# Patient Record
Sex: Female | Born: 1990 | Race: White | Hispanic: No | Marital: Single | Smoking: Former smoker
Health system: Southern US, Community
[De-identification: ages and names within clinical notes are randomized; demographics above are authoritative.]

## PROBLEM LIST (undated history)

## (undated) ENCOUNTER — Inpatient Hospital Stay (HOSPITAL_COMMUNITY): Payer: Self-pay

## (undated) DIAGNOSIS — R309 Painful micturition, unspecified: Principal | ICD-10-CM

## (undated) DIAGNOSIS — B009 Herpesviral infection, unspecified: Secondary | ICD-10-CM

## (undated) DIAGNOSIS — R040 Epistaxis: Principal | ICD-10-CM

## (undated) DIAGNOSIS — N949 Unspecified condition associated with female genital organs and menstrual cycle: Secondary | ICD-10-CM

## (undated) DIAGNOSIS — R319 Hematuria, unspecified: Secondary | ICD-10-CM

## (undated) DIAGNOSIS — F419 Anxiety disorder, unspecified: Secondary | ICD-10-CM

## (undated) HISTORY — DX: Herpesviral infection, unspecified: B00.9

## (undated) HISTORY — DX: Epistaxis: R04.0

## (undated) HISTORY — PX: TONSILLECTOMY: SUR1361

## (undated) HISTORY — DX: Unspecified condition associated with female genital organs and menstrual cycle: N94.9

## (undated) HISTORY — DX: Painful micturition, unspecified: R30.9

## (undated) HISTORY — PX: OTHER SURGICAL HISTORY: SHX169

## (undated) HISTORY — DX: Anxiety disorder, unspecified: F41.9

## (undated) HISTORY — DX: Hematuria, unspecified: R31.9

---
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
## 2012-04-06 DIAGNOSIS — B009 Herpesviral infection, unspecified: Secondary | ICD-10-CM | POA: Insufficient documentation

## 2012-04-15 ENCOUNTER — Ambulatory Visit (INDEPENDENT_AMBULATORY_CARE_PROVIDER_SITE_OTHER): Payer: BC Managed Care – PPO | Admitting: Family Medicine

## 2012-04-15 VITALS — BP 106/63 | HR 76 | Temp 98.2°F | Resp 20 | Ht 63.0 in | Wt 113.0 lb

## 2012-04-15 DIAGNOSIS — Z202 Contact with and (suspected) exposure to infections with a predominantly sexual mode of transmission: Secondary | ICD-10-CM

## 2012-04-15 DIAGNOSIS — Z9189 Other specified personal risk factors, not elsewhere classified: Secondary | ICD-10-CM

## 2012-04-15 MED ORDER — AZITHROMYCIN 250 MG PO TABS: ORAL_TABLET | ORAL | Status: DC

## 2012-04-15 NOTE — Progress Notes (Signed)
  [facility]:  Office Visit  Chief Complaint:  Chief Complaint  Patient presents with  . Exposure to STD     Possible exposure to chlamydia.  Also wants pregnancy testing.    HPI: Sheila [name] is a 21 y.o. female who complains of exposure to STD, + chlamydia and was told today although her friend knew about it 2 months ago. Had a prior h/o chlamydia s/p treatment. No prior  pap smear. Denies any current vaginal dc, rashes,  dysuria, odor. Denis fevers, chills, nausea, abd/pelvic pain, dysparenia  History reviewed. No pertinent past medical history. Past Surgical History  Procedure Date  . Bilateral breast implants  12/2011    History   Social History  . Marital Status: Single    Spouse Name: N/A    Number of Children: N/A  . Years of Education: N/A   Social History Main Topics  . Smoking status: Never Smoker   . Smokeless tobacco: None  . Alcohol Use: 1.2 oz/week    2 Shots of liquor per week  . Drug Use: No  . Sexually Active: Yes    Birth Control/ Protection: Pill   Other Topics Concern  . None   Social History Narrative  . None   Family History  Problem Relation Age of Onset  . Cancer Maternal Grandmother   . Cancer Maternal Grandfather    No Known Allergies Prior to Admission medications   Not on File     ROS: The patient denies fevers, chills, night sweats, unintentional weight loss, chest pain, palpitations, wheezing, dyspnea on exertion, nausea, vomiting, abdominal pain, dysuria, hematuria, melena, numbness, weakness, or tingling.   All other systems have been reviewed and were otherwise negative with the exception of those mentioned in the HPI and as above.    PHYSICAL EXAM: Filed Vitals:   04/15/12 1729  BP: 106/63  Pulse: 76  Temp: 98.2 F (36.8 C)  Resp: 20   Filed Vitals:   04/15/12 1729  Height: 5\' 3"  (1.6 m)  Weight: 113 lb (51.256 kg)   Body mass index is 20.02 kg/(m^2).  General: Alert, no acute  distress HEENT:  Normocephalic, atraumatic, oropharynx patent.  Cardiovascular:  Regular rate and rhythm, no rubs murmurs or gallops.  No Carotid bruits, radial pulse intact. No pedal edema.  Respiratory: Clear to auscultation bilaterally.  No wheezes, rales, or rhonchi.  No cyanosis, no use of accessory musculature GI: No organomegaly, abdomen is soft and non-tender, positive bowel sounds.  No masses. Skin: No rashes. Neurologic: Facial musculature symmetric. Psychiatric: Patient is appropriate throughout our interaction. Lymphatic: No cervical lymphadenopathy Musculoskeletal: Gait intact.   LABS: Results for orders placed in visit on 04/15/12  POCT URINE PREGNANCY      Component Value Range   Preg Test, Ur Negative       EKG/XRAY:   Primary read interpreted by Dr. Conley [facility]s at [facility].   ASSESSMENT/PLAN: Encounter Diagnosis  Name Primary?  . Possible exposure to STD Yes   Labs pending Presumptively treat for Chamydia exposure. Patient was ok with plan and did not want to wait for test results Anticipatory guidance given    Rockne Coons, DO 04/15/2012 6:36 PM

## 2012-04-16 LAB — HIV ANTIBODY (ROUTINE TESTING W REFLEX): HIV: NONREACTIVE

## 2012-04-16 LAB — RPR

## 2012-04-16 LAB — HEPATITIS B SURFACE ANTIGEN: Hepatitis B Surface Ag: NEGATIVE

## 2012-04-16 LAB — HEPATITIS B SURFACE ANTIBODY, QUANTITATIVE: Hep B S AB Quant (Post): 8.7 m[IU]/mL

## 2012-04-16 LAB — HEPATITIS C ANTIBODY: HCV Ab: NEGATIVE

## 2012-04-18 LAB — GC/CHLAMYDIA PROBE AMP, URINE
Chlamydia, Swab/Urine, PCR: NEGATIVE
GC Probe Amp, Urine: NEGATIVE

## 2012-04-18 LAB — HSV(HERPES SIMPLEX VRS) I + II AB-IGG
HSV 1 Glycoprotein G Ab, IgG: <0.1 IV
HSV 2 Glycoprotein G Ab, IgG: 15.06 IV — ABNORMAL HIGH

## 2012-04-19 ENCOUNTER — Telehealth: Payer: Self-pay | Admitting: Family Medicine

## 2012-04-19 NOTE — Telephone Encounter (Signed)
Pt called back and D/W her lab results and info about HSV II. Mailed pt [facility]' info and pt will CB if she needs a Rx.

## 2012-04-19 NOTE — Telephone Encounter (Signed)
LM to call back regarding lab results. 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 Unless she is having vaginal ulcers and sxs then I would not treat unless she has a break out.  HIV, syphilis, gonorrhea, hepatitis B and C, chlamydia, were negative.

## 2013-05-15 ENCOUNTER — Ambulatory Visit (INDEPENDENT_AMBULATORY_CARE_PROVIDER_SITE_OTHER): Payer: BC Managed Care – PPO | Admitting: Obstetrics & Gynecology

## 2013-05-15 ENCOUNTER — Encounter: Payer: Self-pay | Admitting: Obstetrics & Gynecology

## 2013-05-15 VITALS — BP 120/60 | Ht 62.0 in | Wt 110.5 lb

## 2013-05-15 DIAGNOSIS — Z3201 Encounter for pregnancy test, result positive: Secondary | ICD-10-CM

## 2013-05-15 LAB — POCT URINE PREGNANCY: Preg Test, Ur: POSITIVE

## 2013-05-15 NOTE — Progress Notes (Signed)
Pt here for pregnancy test. Positive results. + cramping but no bleeding. Advised can be normal in early pregnancy. With no bleeding, that's a good sign. Advised if cramping got worse or if started bleeding, call office. Pt voiced understanding. JSY

## 2013-05-17 ENCOUNTER — Other Ambulatory Visit: Payer: Self-pay | Admitting: Obstetrics & Gynecology

## 2013-05-17 DIAGNOSIS — O3680X0 Pregnancy with inconclusive fetal viability, not applicable or unspecified: Secondary | ICD-10-CM

## 2013-05-22 ENCOUNTER — Other Ambulatory Visit: Payer: Self-pay | Admitting: Obstetrics & Gynecology

## 2013-05-22 ENCOUNTER — Ambulatory Visit (INDEPENDENT_AMBULATORY_CARE_PROVIDER_SITE_OTHER): Payer: BC Managed Care – PPO

## 2013-05-22 DIAGNOSIS — O26849 Uterine size-date discrepancy, unspecified trimester: Secondary | ICD-10-CM

## 2013-05-22 DIAGNOSIS — O3680X0 Pregnancy with inconclusive fetal viability, not applicable or unspecified: Secondary | ICD-10-CM

## 2013-05-22 DIAGNOSIS — O34599 Maternal care for other abnormalities of gravid uterus, unspecified trimester: Secondary | ICD-10-CM

## 2013-05-22 DIAGNOSIS — O34591 Maternal care for other abnormalities of gravid uterus, first trimester: Secondary | ICD-10-CM

## 2013-05-22 NOTE — Progress Notes (Signed)
U/S-transvaginal u/s performed, uterine didelphys noted, (2 uterine fundus and 2 cervix noted) IUP within Lt uterus, cx appears closed, Lt ovary with C.L. Noted, CRL c/w 5+6wks EDD 01/16/2014, FHR-98bpm noted

## 2013-06-06 ENCOUNTER — Other Ambulatory Visit (HOSPITAL_COMMUNITY)
Admission: RE | Admit: 2013-06-06 | Discharge: 2013-06-06 | Disposition: A | Discharge status: Home / Self Care | Payer: Medicaid Other | Source: Ambulatory Visit | Attending: Advanced Practice Midwife | Admitting: Advanced Practice Midwife

## 2013-06-06 ENCOUNTER — Ambulatory Visit (INDEPENDENT_AMBULATORY_CARE_PROVIDER_SITE_OTHER): Payer: Medicaid Other | Admitting: Advanced Practice Midwife

## 2013-06-06 ENCOUNTER — Encounter: Payer: Self-pay | Admitting: Advanced Practice Midwife

## 2013-06-06 VITALS — BP 116/60 | Wt 106.0 lb

## 2013-06-06 DIAGNOSIS — Z331 Pregnant state, incidental: Secondary | ICD-10-CM

## 2013-06-06 DIAGNOSIS — O3400 Maternal care for unspecified congenital malformation of uterus, unspecified trimester: Secondary | ICD-10-CM

## 2013-06-06 DIAGNOSIS — Z01419 Encounter for gynecological examination (general) (routine) without abnormal findings: Secondary | ICD-10-CM | POA: Insufficient documentation

## 2013-06-06 DIAGNOSIS — Z3400 Encounter for supervision of normal first pregnancy, unspecified trimester: Secondary | ICD-10-CM

## 2013-06-06 DIAGNOSIS — Z1389 Encounter for screening for other disorder: Secondary | ICD-10-CM

## 2013-06-06 DIAGNOSIS — Z3401 Encounter for supervision of normal first pregnancy, first trimester: Secondary | ICD-10-CM

## 2013-06-06 DIAGNOSIS — Z3490 Encounter for supervision of normal pregnancy, unspecified, unspecified trimester: Secondary | ICD-10-CM

## 2013-06-06 DIAGNOSIS — O2390 Unspecified genitourinary tract infection in pregnancy, unspecified trimester: Secondary | ICD-10-CM

## 2013-06-06 DIAGNOSIS — Q512 Other doubling of uterus, unspecified: Secondary | ICD-10-CM

## 2013-06-06 DIAGNOSIS — Z113 Encounter for screening for infections with a predominantly sexual mode of transmission: Secondary | ICD-10-CM | POA: Insufficient documentation

## 2013-06-06 DIAGNOSIS — Q5128 Other doubling of uterus, other specified: Secondary | ICD-10-CM

## 2013-06-06 DIAGNOSIS — O34591 Maternal care for other abnormalities of gravid uterus, first trimester: Secondary | ICD-10-CM

## 2013-06-06 DIAGNOSIS — O98519 Other viral diseases complicating pregnancy, unspecified trimester: Secondary | ICD-10-CM

## 2013-06-06 DIAGNOSIS — B009 Herpesviral infection, unspecified: Secondary | ICD-10-CM

## 2013-06-06 LAB — CBC
HEMATOCRIT: 37.7 % (ref 36.0–46.0)
HEMOGLOBIN: 12.4 g/dL (ref 12.0–15.0)
MCH: 26.4 pg (ref 26.0–34.0)
MCHC: 32.9 g/dL (ref 30.0–36.0)
MCV: 80.4 fL (ref 78.0–100.0)
Platelets: 257 10*3/uL (ref 150–400)
RBC: 4.69 MIL/uL (ref 3.87–5.11)
RDW: 13.1 % (ref 11.5–15.5)
WBC: 10.8 10*3/uL — ABNORMAL HIGH (ref 4.0–10.5)

## 2013-06-06 MED ORDER — OB COMPLETE PETITE 35-5-1-200 MG PO CAPS: 1.0000 | ORAL_CAPSULE | Freq: Every day | ORAL | Status: DC
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
## 2013-06-07 LAB — URINALYSIS, ROUTINE W REFLEX MICROSCOPIC
Bilirubin Urine: NEGATIVE
Glucose, UA: NEGATIVE mg/dL
HGB URINE DIPSTICK: NEGATIVE
Ketones, ur: NEGATIVE mg/dL
LEUKOCYTES UA: NEGATIVE
NITRITE: NEGATIVE
PROTEIN: NEGATIVE mg/dL
Specific Gravity, Urine: 1.021 (ref 1.005–1.030)
UROBILINOGEN UA: 1 mg/dL (ref 0.0–1.0)
pH: 6 (ref 5.0–8.0)

## 2013-06-07 LAB — VARICELLA ZOSTER ANTIBODY, IGG: VARICELLA IGG: 844.5 {index} — AB (ref <135.00)

## 2013-06-07 LAB — URINALYSIS, MICROSCOPIC ONLY
BACTERIA UA: NONE SEEN
CRYSTALS: NONE SEEN
Casts: NONE SEEN

## 2013-06-07 LAB — DRUG SCREEN, URINE, NO CONFIRMATION
Amphetamine Screen, Ur: NEGATIVE
BENZODIAZEPINES.: NEGATIVE
Barbiturate Quant, Ur: NEGATIVE
CREATININE, U: 226.8 mg/dL
Cocaine Metabolites: NEGATIVE
METHADONE: NEGATIVE
Marijuana Metabolite: NEGATIVE
Opiate Screen, Urine: NEGATIVE
PHENCYCLIDINE (PCP): NEGATIVE
PROPOXYPHENE: NEGATIVE

## 2013-06-07 LAB — CYSTIC FIBROSIS DIAGNOSTIC STUDY

## 2013-06-07 LAB — RPR

## 2013-06-07 LAB — RUBELLA SCREEN: RUBELLA: 8.25 {index} — AB (ref <0.90)

## 2013-06-07 LAB — ABO AND RH: Rh Type: POSITIVE

## 2013-06-07 LAB — HIV ANTIBODY (ROUTINE TESTING W REFLEX): HIV: NONREACTIVE

## 2013-06-07 LAB — OXYCODONE SCREEN, UA, RFLX CONFIRM: OXYCODONE SCRN UR: NEGATIVE ng/mL

## 2013-06-07 LAB — HEPATITIS B SURFACE ANTIGEN: HEP B S AG: NEGATIVE

## 2013-06-08 LAB — URINE CULTURE
Colony Count: NO GROWTH
Organism ID, Bacteria: NO GROWTH

## 2013-06-27 ENCOUNTER — Telehealth: Payer: Self-pay | Admitting: *Deleted

## 2013-06-27 NOTE — Telephone Encounter (Signed)
Spoke with pt. Pt has had cold symptoms x 3 days. No fever or wheezing. Advised to try saline nasal spray and run a cool mist humidifier when sleeping. Advised to gargle warm salt water or use cough drops for sore throat. Advised to call if she started with fever or wheezing. Pt voiced understanding. JSY

## 2013-07-04 ENCOUNTER — Ambulatory Visit (INDEPENDENT_AMBULATORY_CARE_PROVIDER_SITE_OTHER): Payer: Medicaid Other

## 2013-07-04 ENCOUNTER — Other Ambulatory Visit: Payer: Self-pay | Admitting: Adult Health

## 2013-07-04 ENCOUNTER — Encounter: Payer: Self-pay | Admitting: Adult Health

## 2013-07-04 ENCOUNTER — Encounter (INDEPENDENT_AMBULATORY_CARE_PROVIDER_SITE_OTHER): Payer: Self-pay

## 2013-07-04 ENCOUNTER — Ambulatory Visit (INDEPENDENT_AMBULATORY_CARE_PROVIDER_SITE_OTHER): Payer: Medicaid Other | Admitting: Adult Health

## 2013-07-04 VITALS — BP 106/58 | Wt 107.8 lb

## 2013-07-04 DIAGNOSIS — Z36 Encounter for antenatal screening of mother: Secondary | ICD-10-CM

## 2013-07-04 DIAGNOSIS — O98519 Other viral diseases complicating pregnancy, unspecified trimester: Secondary | ICD-10-CM

## 2013-07-04 DIAGNOSIS — Z3490 Encounter for supervision of normal pregnancy, unspecified, unspecified trimester: Secondary | ICD-10-CM

## 2013-07-04 DIAGNOSIS — Z331 Pregnant state, incidental: Secondary | ICD-10-CM

## 2013-07-04 DIAGNOSIS — Z1389 Encounter for screening for other disorder: Secondary | ICD-10-CM

## 2013-07-04 DIAGNOSIS — Z3400 Encounter for supervision of normal first pregnancy, unspecified trimester: Secondary | ICD-10-CM

## 2013-07-04 DIAGNOSIS — O3400 Maternal care for unspecified congenital malformation of uterus, unspecified trimester: Secondary | ICD-10-CM

## 2013-07-04 DIAGNOSIS — O0990 Supervision of high risk pregnancy, unspecified, unspecified trimester: Secondary | ICD-10-CM | POA: Insufficient documentation

## 2013-07-04 LAB — POCT URINALYSIS DIPSTICK
GLUCOSE UA: NEGATIVE
Ketones, UA: NEGATIVE
LEUKOCYTES UA: NEGATIVE
NITRITE UA: NEGATIVE
Protein, UA: NEGATIVE
RBC UA: NEGATIVE

## 2013-07-04 NOTE — Progress Notes (Signed)
No complaints today, for IT/NT will follow up in 4 weeks for 2nd IT.Denises any bleeding or cramping.

## 2013-07-04 NOTE — Patient Instructions (Signed)
Second Trimester of Pregnancy The second trimester is from week 13 through week 28, months 4 through 6. The second trimester is often a time when you feel your best. Your body has also adjusted to being pregnant, and you begin to feel better physically. Usually, morning sickness has lessened or quit completely, you may have more energy, and you may have an increase in appetite. The second trimester is also a time when the fetus is growing rapidly. At the end of the sixth month, the fetus is about 9 inches long and weighs about 1 pounds. You will likely begin to feel the baby move (quickening) between 18 and 20 weeks of the pregnancy. BODY CHANGES Your body goes through many changes during pregnancy. The changes vary from woman to woman.   Your weight will continue to increase. You will notice your lower abdomen bulging out.  You may begin to get stretch marks on your hips, abdomen, and breasts.  You may develop headaches that can be relieved by medicines approved by your caregiver.  You may urinate more often because the fetus is pressing on your bladder.  You may develop or continue to have heartburn as a result of your pregnancy.  You may develop constipation because certain hormones are causing the muscles that push waste through your intestines to slow down.  You may develop hemorrhoids or swollen, bulging veins (varicose veins).  You may have back pain because of the weight gain and pregnancy hormones relaxing your joints between the bones in your pelvis and as a result of a shift in weight and the muscles that support your balance.  Your breasts will continue to grow and be tender.  Your gums may bleed and may be sensitive to brushing and flossing.  Dark spots or blotches (chloasma, mask of pregnancy) may develop on your face. This will likely fade after the baby is born.  A dark line from your belly button to the pubic area (linea nigra) may appear. This will likely fade after the  baby is born. WHAT TO EXPECT AT YOUR PRENATAL VISITS During a routine prenatal visit:  You will be weighed to make sure you and the fetus are growing normally.  Your blood pressure will be taken.  Your abdomen will be measured to track your baby's growth.  The fetal heartbeat will be listened to.  Any test results from the previous visit will be discussed. Your caregiver may ask you:  How you are feeling.  If you are feeling the baby move.  If you have had any abnormal symptoms, such as leaking fluid, bleeding, severe headaches, or abdominal cramping.  If you have any questions. Other tests that may be performed during your second trimester include:  Blood tests that check for:  Low iron levels (anemia).  Gestational diabetes (between 24 and 28 weeks).  Rh antibodies.  Urine tests to check for infections, diabetes, or protein in the urine.  An ultrasound to confirm the proper growth and development of the baby.  An amniocentesis to check for possible genetic problems.  Fetal screens for spina bifida and Down syndrome. HOME CARE INSTRUCTIONS   Avoid all smoking, herbs, alcohol, and unprescribed drugs. These chemicals affect the formation and growth of the baby.  Follow your caregiver's instructions regarding medicine use. There are medicines that are either safe or unsafe to take during pregnancy.  Exercise only as directed by your caregiver. Experiencing uterine cramps is a good sign to stop exercising.  Continue to eat regular,   healthy meals.  Wear a good support bra for breast tenderness.  Do not use hot tubs, steam rooms, or saunas.  Wear your seat belt at all times when driving.  Avoid raw meat, uncooked cheese, cat litter boxes, and soil used by cats. These carry germs that can cause birth defects in the baby.  Take your prenatal vitamins.  Try taking a stool softener (if your caregiver approves) if you develop constipation. Eat more high-fiber foods,  such as fresh vegetables or fruit and whole grains. Drink plenty of fluids to keep your urine clear or pale yellow.  Take warm sitz baths to soothe any pain or discomfort caused by hemorrhoids. Use hemorrhoid cream if your caregiver approves.  If you develop varicose veins, wear support hose. Elevate your feet for 15 minutes, 3 4 times a day. Limit salt in your diet.  Avoid heavy lifting, wear low heel shoes, and practice good posture.  Rest with your legs elevated if you have leg cramps or low back pain.  Visit your dentist if you have not gone yet during your pregnancy. Use a soft toothbrush to brush your teeth and be gentle when you floss.  A sexual relationship may be continued unless your caregiver directs you otherwise.  Continue to go to all your prenatal visits as directed by your caregiver. SEEK MEDICAL CARE IF:   You have dizziness.  You have mild pelvic cramps, pelvic pressure, or nagging pain in the abdominal area.  You have persistent nausea, vomiting, or diarrhea.  You have a bad smelling vaginal discharge.  You have pain with urination. SEEK IMMEDIATE MEDICAL CARE IF:   You have a fever.  You are leaking fluid from your vagina.  You have spotting or bleeding from your vagina.  You have severe abdominal cramping or pain.  You have rapid weight gain or loss.  You have shortness of breath with chest pain.  You notice sudden or extreme swelling of your face, hands, ankles, feet, or legs.  You have not felt your baby move in over an hour.  You have severe headaches that do not go away with medicine.  You have vision changes. Document Released: 05/12/2001 Document Revised: 01/18/2013 Document Reviewed: 07/19/2012 [facility] CenterExitCare Patient Information 2014 [place], [place]. Follow up in 4 weeks for 2nd IT draw

## 2013-07-04 NOTE — Progress Notes (Signed)
U/S(12+0wks)-single IUP noted within Lt uterus, +FCA noted, FHR- 165 bpm, CRL c/w dates, Lt Cx appears closed, NB present, NT-1.7563mm, posterior Gr 0 placenta, bilateral adnexa/ovaries wnl, Rt uterus noted on today's exam again

## 2013-07-08 LAB — MATERNAL SCREEN, INTEGRATED #1

## 2013-08-01 ENCOUNTER — Other Ambulatory Visit: Payer: Self-pay | Admitting: Obstetrics & Gynecology

## 2013-08-01 ENCOUNTER — Ambulatory Visit (INDEPENDENT_AMBULATORY_CARE_PROVIDER_SITE_OTHER): Payer: Medicaid Other | Admitting: Women's Health

## 2013-08-01 ENCOUNTER — Encounter (INDEPENDENT_AMBULATORY_CARE_PROVIDER_SITE_OTHER): Payer: Self-pay

## 2013-08-01 ENCOUNTER — Encounter: Payer: Self-pay | Admitting: Women's Health

## 2013-08-01 VITALS — BP 122/62 | Wt 108.8 lb

## 2013-08-01 DIAGNOSIS — O34599 Maternal care for other abnormalities of gravid uterus, unspecified trimester: Secondary | ICD-10-CM

## 2013-08-01 DIAGNOSIS — Q5128 Other doubling of uterus, other specified: Secondary | ICD-10-CM

## 2013-08-01 DIAGNOSIS — Z1389 Encounter for screening for other disorder: Secondary | ICD-10-CM

## 2013-08-01 DIAGNOSIS — Z3490 Encounter for supervision of normal pregnancy, unspecified, unspecified trimester: Secondary | ICD-10-CM

## 2013-08-01 DIAGNOSIS — O3400 Maternal care for unspecified congenital malformation of uterus, unspecified trimester: Secondary | ICD-10-CM

## 2013-08-01 DIAGNOSIS — O98519 Other viral diseases complicating pregnancy, unspecified trimester: Secondary | ICD-10-CM

## 2013-08-01 DIAGNOSIS — Z331 Pregnant state, incidental: Secondary | ICD-10-CM

## 2013-08-01 LAB — POCT URINALYSIS DIPSTICK
Glucose, UA: NEGATIVE
Ketones, UA: NEGATIVE
Leukocytes, UA: NEGATIVE
Nitrite, UA: NEGATIVE
Protein, UA: NEGATIVE
RBC UA: NEGATIVE

## 2013-08-01 NOTE — Progress Notes (Signed)
Denies cramping, lof, vb, uti s/s.  No complaints.  Reviewed warning s/s to report.  All questions answered. F/U in 4wks for anatomy u/s and visit.  2nd IT today.   

## 2013-08-01 NOTE — Patient Instructions (Signed)
[place] Pediatricians:  [facility] [phone]            [facility] [phone]                 [facility] [phone] (usually doesn't accept new patients unless you have family there already, you are always welcome to call and ask)             [facility] ([address]) [phone]   [facility] Pediatricians:   [facility]: [phone]  [facility]: [phone]   Second Trimester of Pregnancy The second trimester is from week 13 through week 28, months 4 through 6. The second trimester is often a time when you feel your best. Your body has also adjusted to being pregnant, and you begin to feel better physically. Usually, morning sickness has lessened or quit completely, you may have more energy, and you may have an increase in appetite. The second trimester is also a time when the fetus is growing rapidly. At the end of the sixth month, the fetus is about 9 inches long and weighs about 1 pounds. You will likely begin to feel the baby move (quickening) between 18 and 20 weeks of the pregnancy. BODY CHANGES Your body goes through many changes during pregnancy. The changes vary from woman to woman.   Your weight will continue to increase. You will notice your lower abdomen bulging out.  You may begin to get stretch marks on your hips, abdomen, and breasts.  You may develop headaches that can be relieved by medicines approved by your caregiver.  You may urinate more often because the fetus is pressing on your bladder.  You may develop or continue to have heartburn as a result of your pregnancy.  You may develop constipation because certain hormones are causing the muscles that push waste through your intestines to slow down.  You may develop hemorrhoids or swollen, bulging veins (varicose veins).  You may have back pain because of the weight gain and pregnancy  hormones relaxing your joints between the bones in your pelvis and as a result of a shift in weight and the muscles that support your balance.  Your breasts will continue to grow and be tender.  Your gums may bleed and may be sensitive to brushing and flossing.  Dark spots or blotches (chloasma, mask of pregnancy) may develop on your face. This will likely fade after the baby is born.  A dark line from your belly button to the pubic area (linea nigra) may appear. This will likely fade after the baby is born. WHAT TO EXPECT AT YOUR PRENATAL VISITS During a routine prenatal visit:  You will be weighed to make sure you and the fetus are growing normally.  Your blood pressure will be taken.  Your abdomen will be measured to track your baby's growth.  The fetal heartbeat will be listened to.  Any test results from the previous visit will be discussed. Your caregiver may ask you:  How you are feeling.  If you are feeling the baby move.  If you have had any abnormal symptoms, such as leaking fluid, bleeding, severe headaches, or abdominal cramping.  If you have any questions. Other tests that may be performed during your second trimester include:  Blood tests that check for:  Low iron levels (anemia).  Gestational diabetes (between 24 and 28 weeks).  Rh antibodies.  Urine tests to check for infections, diabetes, or protein  in the urine.  An ultrasound to confirm the proper growth and development of the baby.  An amniocentesis to check for possible genetic problems.  Fetal screens for spina bifida and Down syndrome. HOME CARE INSTRUCTIONS   Avoid all smoking, herbs, alcohol, and unprescribed drugs. These chemicals affect the formation and growth of the baby.  Follow your caregiver's instructions regarding medicine use. There are medicines that are either safe or unsafe to take during pregnancy.  Exercise only as directed by your caregiver. Experiencing uterine cramps is a  good sign to stop exercising.  Continue to eat regular, healthy meals.  Wear a good support bra for breast tenderness.  Do not use hot tubs, steam rooms, or saunas.  Wear your seat belt at all times when driving.  Avoid raw meat, uncooked cheese, cat litter boxes, and soil used by cats. These carry germs that can cause birth defects in the baby.  Take your prenatal vitamins.  Try taking a stool softener (if your caregiver approves) if you develop constipation. Eat more high-fiber foods, such as fresh vegetables or fruit and whole grains. Drink plenty of fluids to keep your urine clear or pale yellow.  Take warm sitz baths to soothe any pain or discomfort caused by hemorrhoids. Use hemorrhoid cream if your caregiver approves.  If you develop varicose veins, wear support hose. Elevate your feet for 15 minutes, 3 4 times a day. Limit salt in your diet.  Avoid heavy lifting, wear low heel shoes, and practice good posture.  Rest with your legs elevated if you have leg cramps or low back pain.  Visit your dentist if you have not gone yet during your pregnancy. Use a soft toothbrush to brush your teeth and be gentle when you floss.  A sexual relationship may be continued unless your caregiver directs you otherwise.  Continue to go to all your prenatal visits as directed by your caregiver. SEEK MEDICAL CARE IF:   You have dizziness.  You have mild pelvic cramps, pelvic pressure, or nagging pain in the abdominal area.  You have persistent nausea, vomiting, or diarrhea.  You have a bad smelling vaginal discharge.  You have pain with urination. SEEK IMMEDIATE MEDICAL CARE IF:   You have a fever.  You are leaking fluid from your vagina.  You have spotting or bleeding from your vagina.  You have severe abdominal cramping or pain.  You have rapid weight gain or loss.  You have shortness of breath with chest pain.  You notice sudden or extreme swelling of your face, hands,  ankles, feet, or legs.  You have not felt your baby move in over an hour.  You have severe headaches that do not go away with medicine.  You have vision changes. Document Released: 05/12/2001 Document Revised: 01/18/2013 Document Reviewed: 07/19/2012 [facility]Exit[facility] Patient Information 2014 [place] IslandExitCare, [place]LL[place].

## 2013-08-05 LAB — MATERNAL SCREEN, INTEGRATED #2
AFP MoM: 1.22
AFP, Serum: 53 ng/mL
CALCULATED GESTATIONAL AGE MAT SCREEN: 16.4
Crown Rump Length: 61.5 mm
Estriol Mom: 1.19
Estriol, Free: 1.27 ng/mL
HCG, MOM MAT SCREEN: 1.55
INHIBIN A DIMERIC MAT SCREEN: 184 pg/mL
INHIBIN A MOM MAT SCREEN: 0.92
MSS Down Syndrome: <1:5000 {titer}
MSS Trisomy 18 Risk: <1:5000 {titer}
NT MoM: 1.16
Nuchal Translucency: 1.63 mm
Number of fetuses: 1
PAPP-A MoM: 1.09
PAPP-A: 1368 ng/mL
hCG, Serum: 61.1 IU/mL

## 2013-08-29 ENCOUNTER — Ambulatory Visit (INDEPENDENT_AMBULATORY_CARE_PROVIDER_SITE_OTHER): Payer: Medicaid Other | Admitting: Obstetrics & Gynecology

## 2013-08-29 ENCOUNTER — Encounter: Payer: Self-pay | Admitting: Women's Health

## 2013-08-29 ENCOUNTER — Encounter: Payer: Self-pay | Admitting: Obstetrics & Gynecology

## 2013-08-29 ENCOUNTER — Other Ambulatory Visit: Payer: Self-pay | Admitting: Women's Health

## 2013-08-29 ENCOUNTER — Ambulatory Visit (INDEPENDENT_AMBULATORY_CARE_PROVIDER_SITE_OTHER): Payer: Medicaid Other

## 2013-08-29 VITALS — BP 100/60 | Wt 114.0 lb

## 2013-08-29 DIAGNOSIS — Z331 Pregnant state, incidental: Secondary | ICD-10-CM

## 2013-08-29 DIAGNOSIS — Z1389 Encounter for screening for other disorder: Secondary | ICD-10-CM

## 2013-08-29 DIAGNOSIS — O3400 Maternal care for unspecified congenital malformation of uterus, unspecified trimester: Secondary | ICD-10-CM

## 2013-08-29 DIAGNOSIS — O35EXX0 Maternal care for other (suspected) fetal abnormality and damage, fetal genitourinary anomalies, not applicable or unspecified: Secondary | ICD-10-CM | POA: Insufficient documentation

## 2013-08-29 DIAGNOSIS — Q512 Other doubling of uterus, unspecified: Secondary | ICD-10-CM

## 2013-08-29 DIAGNOSIS — O358XX0 Maternal care for other (suspected) fetal abnormality and damage, not applicable or unspecified: Secondary | ICD-10-CM

## 2013-08-29 DIAGNOSIS — Z3490 Encounter for supervision of normal pregnancy, unspecified, unspecified trimester: Secondary | ICD-10-CM

## 2013-08-29 DIAGNOSIS — Q5128 Other doubling of uterus, other specified: Secondary | ICD-10-CM

## 2013-08-29 DIAGNOSIS — O34599 Maternal care for other abnormalities of gravid uterus, unspecified trimester: Secondary | ICD-10-CM

## 2013-08-29 DIAGNOSIS — O98519 Other viral diseases complicating pregnancy, unspecified trimester: Secondary | ICD-10-CM

## 2013-08-29 LAB — POCT URINALYSIS DIPSTICK
Blood, UA: NEGATIVE
Glucose, UA: NEGATIVE
Glucose, UA: NEGATIVE
KETONES UA: NEGATIVE
Leukocytes, UA: NEGATIVE
Nitrite, UA: NEGATIVE
Protein, UA: NEGATIVE
RBC UA: NEGATIVE

## 2013-08-29 NOTE — Progress Notes (Signed)
U/S(20+0wks)-single active fetus, meas c/w dates, fluid wnl, bilateral adnexa wnl, cx appears closed (3.2cm), anterior Gr 0 placenta, female fetus, bilateral hydronephrosis Rt-5.575mm Lt-3.627mm bladder volume appears WNL, no other abnl noted

## 2013-08-29 NOTE — Progress Notes (Signed)
, °

## 2013-08-29 NOTE — Progress Notes (Signed)
See sono report, repeat sonogram at 28 weeks  BP weight and urine results all reviewed and noted. Patient reports good fetal movement, denies any bleeding and no rupture of membranes symptoms or regular contractions. Patient is without complaints. All questions were answered.

## 2013-09-06 ENCOUNTER — Ambulatory Visit (INDEPENDENT_AMBULATORY_CARE_PROVIDER_SITE_OTHER): Payer: Medicaid Other | Admitting: Adult Health

## 2013-09-06 ENCOUNTER — Encounter: Payer: Self-pay | Admitting: Adult Health

## 2013-09-06 VITALS — BP 100/58 | Wt 116.0 lb

## 2013-09-06 DIAGNOSIS — N949 Unspecified condition associated with female genital organs and menstrual cycle: Secondary | ICD-10-CM

## 2013-09-06 DIAGNOSIS — O9989 Other specified diseases and conditions complicating pregnancy, childbirth and the puerperium: Secondary | ICD-10-CM

## 2013-09-06 DIAGNOSIS — R040 Epistaxis: Secondary | ICD-10-CM

## 2013-09-06 DIAGNOSIS — Z1389 Encounter for screening for other disorder: Secondary | ICD-10-CM

## 2013-09-06 DIAGNOSIS — Z331 Pregnant state, incidental: Secondary | ICD-10-CM

## 2013-09-06 HISTORY — DX: Epistaxis: R04.0

## 2013-09-06 HISTORY — DX: Unspecified condition associated with female genital organs and menstrual cycle: N94.9

## 2013-09-06 LAB — POCT URINALYSIS DIPSTICK
GLUCOSE UA: NEGATIVE
KETONES UA: NEGATIVE
Leukocytes, UA: NEGATIVE
Nitrite, UA: NEGATIVE
Protein, UA: NEGATIVE
RBC UA: NEGATIVE

## 2013-09-06 MED ORDER — OB COMPLETE PETITE 35-5-1-200 MG PO CAPS: 1.0000 | ORAL_CAPSULE | Freq: Every day | ORAL | Status: DC

## 2013-09-06 NOTE — Progress Notes (Signed)
Complains of 4 nosebleeds, told this is common try sea mist/saline nose spray, also complains of cramping yesterday none today no bleeding, ?round ligament pain cervix long and closed on US. Reassured, review handouts, call prn problems keep follow up appt.

## 2013-09-06 NOTE — Patient Instructions (Signed)
Nosebleed Nosebleeds can be caused by many conditions including trauma, infections, polyps, foreign bodies, dry mucous membranes or climate, medications and air conditioning. Most nosebleeds occur in the front of the nose. It is because of this location that most nosebleeds can be controlled by pinching the nostrils gently and continuously. Do this for at least 10 to 20 minutes. The reason for this long continuous pressure is that you must hold it long enough for the blood to clot. If during that 10 to 20 minute time period, pressure is released, the process may have to be started again. The nosebleed may stop by itself, quit with pressure, need concentrated heating (cautery) or stop with pressure from packing. HOME CARE INSTRUCTIONS   If your nose was packed, try to maintain the pack inside until your caregiver removes it. If a gauze pack was used and it starts to fall out, gently replace or cut the end off. Do not cut if a balloon catheter was used to pack the nose. Otherwise, do not remove unless instructed.  Avoid blowing your nose for 12 hours after treatment. This could dislodge the pack or clot and start bleeding again.  If the bleeding starts again, sit up and bending forward, gently pinch the front half of your nose continuously for 20 minutes.  If bleeding was caused by dry mucous membranes, cover the inside of your nose every morning with a petroleum or antibiotic ointment. Use your little fingertip as an applicator. Do this as needed during dry weather. This will keep the mucous membranes moist and allow them to heal.  Maintain humidity in your home by using less air conditioning or using a humidifier.  Do not use aspirin or medications which make bleeding more likely. Your caregiver can give you recommendations on this.  Resume normal activities as able but try to avoid straining, lifting or bending at the waist for several days.  If the nosebleeds become recurrent and the cause is  unknown, your caregiver may suggest laboratory tests. SEEK IMMEDIATE MEDICAL CARE IF:   Bleeding recurs and cannot be controlled.  There is unusual bleeding from or bruising on other parts of the body.  You have a fever.  Nosebleeds continue.  There is any worsening of the condition which originally brought you in.  You become lightheaded, feel faint, become sweaty or vomit blood. MAKE SURE YOU:   Understand these instructions.  Will watch your condition.  Will get help right away if you are not doing well or get worse. Document Released: 02/25/2005 Document Revised: 08/10/2011 Document Reviewed: 04/19/2009 [facility]Exit[facility] Patient Information 2014 [place] RockExitCare, [place]LL[place]. Try sea mist Follow up as scheduled

## 2013-09-27 ENCOUNTER — Ambulatory Visit (INDEPENDENT_AMBULATORY_CARE_PROVIDER_SITE_OTHER): Payer: Medicaid Other | Admitting: Obstetrics and Gynecology

## 2013-09-27 ENCOUNTER — Encounter: Payer: Self-pay | Admitting: Obstetrics and Gynecology

## 2013-09-27 VITALS — BP 118/76 | Wt 121.0 lb

## 2013-09-27 DIAGNOSIS — O98519 Other viral diseases complicating pregnancy, unspecified trimester: Secondary | ICD-10-CM

## 2013-09-27 DIAGNOSIS — O34599 Maternal care for other abnormalities of gravid uterus, unspecified trimester: Secondary | ICD-10-CM

## 2013-09-27 DIAGNOSIS — Z1389 Encounter for screening for other disorder: Secondary | ICD-10-CM

## 2013-09-27 DIAGNOSIS — Q5128 Other doubling of uterus, other specified: Principal | ICD-10-CM

## 2013-09-27 DIAGNOSIS — Z3490 Encounter for supervision of normal pregnancy, unspecified, unspecified trimester: Secondary | ICD-10-CM

## 2013-09-27 DIAGNOSIS — Z331 Pregnant state, incidental: Secondary | ICD-10-CM

## 2013-09-27 LAB — POCT URINALYSIS DIPSTICK
GLUCOSE UA: NEGATIVE
KETONES UA: NEGATIVE
LEUKOCYTES UA: NEGATIVE
Nitrite, UA: NEGATIVE
Protein, UA: NEGATIVE

## 2013-09-27 NOTE — Progress Notes (Signed)
Pt denies any problems or concerns at this time.  

## 2013-09-27 NOTE — Patient Instructions (Signed)
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 You will most likely deliver earlier than your August due date. Please watch for increased vaginal discharge, significant increase in uterine cramping. Please read up on preterm labor information to help identify signs.   Preterm Labor Information Preterm labor is when labor starts at less than 37 weeks of pregnancy. The normal length of a pregnancy is 39 to 41 weeks. CAUSES Often, there is no identifiable underlying cause as to why a woman goes into preterm labor. One of the most common known causes of preterm labor is infection. Infections of the uterus, cervix, vagina, amniotic sac, bladder, kidney, or even the lungs (pneumonia) can cause labor to start. Other suspected causes of preterm labor include:   Urogenital infections, such as yeast infections and bacterial vaginosis.   Uterine abnormalities (uterine shape, uterine septum, fibroids, or bleeding from the placenta).   A cervix that has been operated on (it may fail to stay closed).   Malformations in the fetus.   Multiple gestations (twins, triplets, and so on).   Breakage of the amniotic sac.  RISK FACTORS  Having a previous history of preterm labor.   Having premature rupture of membranes (PROM).   Having a placenta that covers the opening of the cervix (placenta previa).   Having a placenta that separates from the uterus (placental abruption).   Having a cervix that is too weak to hold the fetus in the uterus (incompetent cervix).   Having too much fluid in the amniotic sac (polyhydramnios).   Taking illegal drugs or smoking while pregnant.   Not gaining enough weight while pregnant.   Being younger than 4018 and older than 22 years old.   Having a low socioeconomic status.   Being African American. SYMPTOMS Signs and symptoms of preterm labor include:   Menstrual-like cramps, abdominal pain, or back pain.  Uterine  contractions that are regular, as frequent as six in an hour, regardless of their intensity (may be mild or painful).  Contractions that start on the top of the uterus and spread down to the lower abdomen and back.   A sense of increased pelvic pressure.   A watery or bloody mucus discharge that comes from the vagina.  TREATMENT Depending on the length of the pregnancy and other circumstances, your health care provider may suggest bed rest. If necessary, there are medicines that can be given to stop contractions and to mature the fetal lungs. If labor happens before 34 weeks of pregnancy, a prolonged hospital stay may be recommended. Treatment depends on the condition of both you and the fetus.  WHAT SHOULD YOU DO IF YOU THINK YOU ARE IN PRETERM LABOR? Call your health care provider right away. You will need to go to the hospital to get checked immediately. HOW CAN YOU PREVENT PRETERM LABOR IN FUTURE PREGNANCIES? You should:   Stop smoking if you smoke.  Maintain healthy weight gain and avoid chemicals and drugs that are not necessary.  Be watchful for any type of infection.  Inform your health care provider if you have a known history of preterm labor. Document Released: 08/08/2003 Document Revised: 01/18/2013 Document Reviewed: 06/20/2012 [facility] Patient Information 2014 [place], [place].

## 2013-09-27 NOTE — Progress Notes (Signed)
2433w1d. G1P0. No complaints at this time. Reports [name] is in the picture. Pt has a didelphys uterus. Pt and pt's mother's questions answered to apparent satisfactions. Preterm labor information given. Encouraged childbirth classes.

## 2013-10-09 ENCOUNTER — Telehealth: Payer: Self-pay | Admitting: Obstetrics and Gynecology

## 2013-10-09 NOTE — Telephone Encounter (Signed)
Spoke with pt. Pt states she is having a thick, pinkish/brownish discharge. Started today. + odor. + baby movement. Cramps this am, not now. Call transferred to front desk for appt for tomorrow. Advised to call the after hours nurse line if anything changes. Pt voiced understanding. JSY

## 2013-10-10 ENCOUNTER — Encounter: Payer: Self-pay | Admitting: Obstetrics & Gynecology

## 2013-10-10 ENCOUNTER — Ambulatory Visit (INDEPENDENT_AMBULATORY_CARE_PROVIDER_SITE_OTHER): Payer: Medicaid Other | Admitting: Obstetrics & Gynecology

## 2013-10-10 VITALS — BP 130/70 | Wt 123.0 lb

## 2013-10-10 DIAGNOSIS — N760 Acute vaginitis: Secondary | ICD-10-CM

## 2013-10-10 DIAGNOSIS — Z1389 Encounter for screening for other disorder: Secondary | ICD-10-CM

## 2013-10-10 DIAGNOSIS — Z331 Pregnant state, incidental: Secondary | ICD-10-CM

## 2013-10-10 LAB — POCT URINALYSIS DIPSTICK
Glucose, UA: NEGATIVE
Ketones, UA: NEGATIVE
Leukocytes, UA: NEGATIVE
Nitrite, UA: NEGATIVE
PROTEIN UA: NEGATIVE
RBC UA: NEGATIVE

## 2013-10-10 MED ORDER — OB COMPLETE PETITE 35-5-1-200 MG PO CAPS: 1.0000 | ORAL_CAPSULE | Freq: Every day | ORAL | Status: DC

## 2013-10-10 MED ORDER — METRONIDAZOLE 500 MG PO TABS: 500.0000 mg | ORAL_TABLET | Freq: Two times a day (BID) | ORAL | Status: DC

## 2013-10-10 NOTE — Progress Notes (Signed)
Complaint of vaginal discharge:  Wet prep +BV, -trichomonas neg yeast metronidaole 500 BIDx 7days  BP weight and urine results all reviewed and noted. Patient reports good fetal movement, denies any bleeding and no rupture of membranes symptoms or regular contractions. Patient is without complaints. All questions were answered.

## 2013-10-10 NOTE — Addendum Note (Signed)
Addended by: Lazaro ArmsEURE, LUTHER H on: 10/10/2013 02:45 PM   Modules accepted: Orders

## 2013-10-10 NOTE — Addendum Note (Signed)
Addended by: Criss AlvinePULLIAM, CHRYSTAL G on: 10/10/2013 02:48 PM   Modules accepted: Orders

## 2013-10-17 ENCOUNTER — Observation Stay (HOSPITAL_COMMUNITY): Payer: Medicaid Other

## 2013-10-17 ENCOUNTER — Ambulatory Visit (INDEPENDENT_AMBULATORY_CARE_PROVIDER_SITE_OTHER): Payer: Medicaid Other | Admitting: Women's Health

## 2013-10-17 ENCOUNTER — Encounter: Payer: Self-pay | Admitting: Women's Health

## 2013-10-17 ENCOUNTER — Other Ambulatory Visit: Payer: Self-pay | Admitting: Women's Health

## 2013-10-17 ENCOUNTER — Ambulatory Visit (INDEPENDENT_AMBULATORY_CARE_PROVIDER_SITE_OTHER): Payer: Medicaid Other

## 2013-10-17 ENCOUNTER — Inpatient Hospital Stay (HOSPITAL_COMMUNITY)
Admission: AD | Admit: 2013-10-17 | Discharge: 2013-10-25 | DRG: 781 | Disposition: A | Discharge status: Home / Self Care | Payer: Medicaid Other | Source: Ambulatory Visit | Attending: Family Medicine | Admitting: Family Medicine

## 2013-10-17 VITALS — BP 120/68 | Wt 123.8 lb

## 2013-10-17 DIAGNOSIS — Q512 Other doubling of uterus, unspecified: Secondary | ICD-10-CM

## 2013-10-17 DIAGNOSIS — A6000 Herpesviral infection of urogenital system, unspecified: Secondary | ICD-10-CM | POA: Diagnosis present

## 2013-10-17 DIAGNOSIS — O368390 Maternal care for abnormalities of the fetal heart rate or rhythm, unspecified trimester, not applicable or unspecified: Secondary | ICD-10-CM | POA: Diagnosis not present

## 2013-10-17 DIAGNOSIS — Z331 Pregnant state, incidental: Secondary | ICD-10-CM

## 2013-10-17 DIAGNOSIS — O98519 Other viral diseases complicating pregnancy, unspecified trimester: Secondary | ICD-10-CM | POA: Diagnosis present

## 2013-10-17 DIAGNOSIS — O358XX0 Maternal care for other (suspected) fetal abnormality and damage, not applicable or unspecified: Secondary | ICD-10-CM | POA: Diagnosis present

## 2013-10-17 DIAGNOSIS — O26872 Cervical shortening, second trimester: Secondary | ICD-10-CM

## 2013-10-17 DIAGNOSIS — O3400 Maternal care for unspecified congenital malformation of uterus, unspecified trimester: Secondary | ICD-10-CM

## 2013-10-17 DIAGNOSIS — Q5128 Other doubling of uterus, other specified: Secondary | ICD-10-CM

## 2013-10-17 DIAGNOSIS — O09899 Supervision of other high risk pregnancies, unspecified trimester: Secondary | ICD-10-CM

## 2013-10-17 DIAGNOSIS — O34599 Maternal care for other abnormalities of gravid uterus, unspecified trimester: Secondary | ICD-10-CM

## 2013-10-17 DIAGNOSIS — O0990 Supervision of high risk pregnancy, unspecified, unspecified trimester: Secondary | ICD-10-CM

## 2013-10-17 DIAGNOSIS — O26879 Cervical shortening, unspecified trimester: Secondary | ICD-10-CM

## 2013-10-17 DIAGNOSIS — O4700 False labor before 37 completed weeks of gestation, unspecified trimester: Secondary | ICD-10-CM | POA: Diagnosis present

## 2013-10-17 DIAGNOSIS — Z1389 Encounter for screening for other disorder: Secondary | ICD-10-CM

## 2013-10-17 DIAGNOSIS — O4290 Premature rupture of membranes, unspecified as to length of time between rupture and onset of labor, unspecified weeks of gestation: Principal | ICD-10-CM | POA: Diagnosis present

## 2013-10-17 LAB — CBC
HCT: 33.1 % — ABNORMAL LOW (ref 36.0–46.0)
HEMOGLOBIN: 11 g/dL — AB (ref 12.0–15.0)
MCH: 26.8 pg (ref 26.0–34.0)
MCHC: 33.2 g/dL (ref 30.0–36.0)
MCV: 80.7 fL (ref 78.0–100.0)
Platelets: 257 10*3/uL (ref 150–400)
RBC: 4.1 MIL/uL (ref 3.87–5.11)
RDW: 12.2 % (ref 11.5–15.5)
WBC: 15.1 10*3/uL — ABNORMAL HIGH (ref 4.0–10.5)

## 2013-10-17 LAB — POCT URINALYSIS DIPSTICK
Glucose, UA: NEGATIVE
KETONES UA: NEGATIVE
Leukocytes, UA: NEGATIVE
Nitrite, UA: NEGATIVE
PROTEIN UA: NEGATIVE

## 2013-10-17 LAB — OB RESULTS CONSOLE GC/CHLAMYDIA
CHLAMYDIA, DNA PROBE: NEGATIVE
GC PROBE AMP, GENITAL: NEGATIVE

## 2013-10-17 LAB — AMNISURE RUPTURE OF MEMBRANE (ROM) NOT AT ARMC: AMNISURE: POSITIVE

## 2013-10-17 MED ORDER — MAGNESIUM SULFATE 40 G IN LACTATED RINGERS - SIMPLE
2.0000 g/h | INTRAVENOUS | Status: DC
  Filled 2013-10-17: qty 500

## 2013-10-17 MED ORDER — MAGNESIUM SULFATE BOLUS VIA INFUSION
4.0000 g | Freq: Once | INTRAVENOUS | Status: AC
  Administered 2013-10-17: 4 g via INTRAVENOUS
  Filled 2013-10-17: qty 500

## 2013-10-17 MED ORDER — AMOXICILLIN 500 MG PO CAPS
500.0000 mg | ORAL_CAPSULE | Freq: Three times a day (TID) | ORAL | Status: AC
  Administered 2013-10-19 – 2013-10-24 (×15): 500 mg via ORAL
  Filled 2013-10-17 (×15): qty 1

## 2013-10-17 MED ORDER — DOCUSATE SODIUM 100 MG PO CAPS
100.0000 mg | ORAL_CAPSULE | Freq: Every day | ORAL | Status: DC
  Administered 2013-10-18 – 2013-10-25 (×8): 100 mg via ORAL
  Filled 2013-10-17 (×8): qty 1

## 2013-10-17 MED ORDER — CALCIUM CARBONATE ANTACID 500 MG PO CHEW: 2.0000 | CHEWABLE_TABLET | ORAL | Status: DC | PRN

## 2013-10-17 MED ORDER — PROGESTERONE MICRONIZED 200 MG PO CAPS
200.0000 mg | ORAL_CAPSULE | Freq: Every day | ORAL | Status: DC
  Filled 2013-10-17: qty 1

## 2013-10-17 MED ORDER — PRENATAL MULTIVITAMIN CH
1.0000 | ORAL_TABLET | Freq: Every day | ORAL | Status: DC
  Administered 2013-10-18 – 2013-10-25 (×8): 1 via ORAL
  Filled 2013-10-17 (×8): qty 1

## 2013-10-17 MED ORDER — ERYTHROMYCIN BASE 250 MG PO TABS
250.0000 mg | ORAL_TABLET | Freq: Four times a day (QID) | ORAL | Status: AC
  Administered 2013-10-19 – 2013-10-24 (×20): 250 mg via ORAL
  Filled 2013-10-17 (×21): qty 1

## 2013-10-17 MED ORDER — BETAMETHASONE SOD PHOS & ACET 6 (3-3) MG/ML IJ SUSP
12.0000 mg | INTRAMUSCULAR | Status: AC
  Administered 2013-10-17 – 2013-10-18 (×2): 12 mg via INTRAMUSCULAR
  Filled 2013-10-17 (×2): qty 2

## 2013-10-17 MED ORDER — DEXTROSE IN LACTATED RINGERS 5 % IV SOLN
INTRAVENOUS | Status: DC
  Administered 2013-10-17 – 2013-10-18 (×2): via INTRAVENOUS

## 2013-10-17 MED ORDER — SODIUM CHLORIDE 0.9 % IV SOLN
2.0000 g | Freq: Four times a day (QID) | INTRAVENOUS | Status: AC
  Administered 2013-10-17 – 2013-10-19 (×8): 2 g via INTRAVENOUS
  Filled 2013-10-17 (×8): qty 2000

## 2013-10-17 MED ORDER — LACTATED RINGERS IV SOLN: INTRAVENOUS | Status: DC

## 2013-10-17 MED ORDER — SODIUM CHLORIDE 0.9 % IV SOLN
250.0000 mg | Freq: Four times a day (QID) | INTRAVENOUS | Status: AC
  Administered 2013-10-17 – 2013-10-19 (×8): 250 mg via INTRAVENOUS
  Filled 2013-10-17 (×8): qty 5

## 2013-10-17 MED ORDER — ACETAMINOPHEN 325 MG PO TABS: 650.0000 mg | ORAL_TABLET | ORAL | Status: DC | PRN

## 2013-10-17 MED ORDER — ZOLPIDEM TARTRATE 5 MG PO TABS: 5.0000 mg | ORAL_TABLET | Freq: Every evening | ORAL | Status: DC | PRN

## 2013-10-17 NOTE — Progress Notes (Signed)
Feels tired.

## 2013-10-17 NOTE — Progress Notes (Signed)
Patient name change in epic.Unable to view strips. All previous strips will be under "Sheila [name]"

## 2013-10-17 NOTE — Progress Notes (Signed)
U/S(27+0wks)-vtx active fetus, approp growth EFW 2 lb 4 oz (50th%tile), FHR- 162 bpm, (IUP in Lt uterus) Lt cervix appears with funneling and no functional cervix noted, AFI-13.4cm WNL Single Deepest Pocket-6.7cm, female fetus, bilateral mild hydronephrosis remains Rt-6.809mm LT-3.167mm with NL appearing bladder volume noted, anterior Gr 1 placenta, (Bilateral Maternal Kidneys noted on today's exam)

## 2013-10-17 NOTE — Consult Note (Signed)
Neonatology Consult to Antenatal Patient: 10/17/2013 10:26 PM    I was requested by Dr Shawnie PonsPratt to see this patient in order to provide antenatal counseling due to  PPROM at [redacted] weeks gestation.   Sheila [name] is a  23 y/o Primigravida who was admitted this afternoon and is now [redacted] weeks GA.   She is currently "not" having active labor.  She is getting BMZ (x1 dose), Magnesium Sulfate (for CP prophylaxis) and IV antibiotics namely Ampicillin and Erythromycin.  Fetal sonogram showed bilateral mild hydronephrosis with EFW 1009 grams.  I spoke with Sheila [name] and her mother in Room 156.   We discussed in detail what to expect in case of possible delivery of the infant in the next few days including morbidity and mortality at this gestational age, usual delivery room resuscitation including intubation and surfactant administration in the DR.  Discussed possible respiratory complications and need for support including mechanical ventilation, IV access, sepsis work-up, NG/OG feedings (benefits of BF or formula --- [name] desires breast feeding, which was encouraged), risk for IVH with the potential for motor/cognitive deficits, length of stay and long-term outcome.  They had a few questions, which I answered.   I offered a NICU tour to any interested family members and would be glad to come back if she has more questions later.  Thank you for asking us to see this patient and allowing us to participate in her care.  Please call if there are any further questions.   Overton MamMary Ann T Candise Crabtree, MD (Attending Neonatologist)  Total length of face-to-face or floor/unit time for this encounter was 20 minutes.

## 2013-10-17 NOTE — MAU Note (Signed)
Pt presents to MAU tearful. Pt denies pain. Registration secretary called to antenatal because pt is a direct admit. Warm blanket and emotional support given while in lobby awaiting transfer to room.

## 2013-10-17 NOTE — H&P (Signed)
Sheila [name] is an 23 y.o. G1P0 3015w0d female.   Chief Complaint: Leaking fluid  HPI: [facility]  Reports needing towel between her legs.  Has h/o didelphys uterus.  Has had some cramping and increased vaginal discharge in 1 month prior to admission.  Negative for ROM at office, but found to be 1 cm/80 and have no measurable cervix on us.  Sent in for further management.  Found to have + Amnisure here.  Past Medical History  Diagnosis Date  . Medical history non-contributory   . HSV-2 infection   . Nosebleed 09/06/2013  . Round ligament pain 09/06/2013    Past Surgical History  Procedure Laterality Date  . Bilateral breast implants  12/2011      Family History  Problem Relation Age of Onset  . Cancer Maternal Grandmother   . Cancer Maternal Grandfather    Social History:  reports that she has never smoked. She has never used smokeless tobacco. She reports that she does not drink alcohol or use illicit drugs.  Allergies: No Known Allergies  No current facility-administered medications on file prior to encounter.   Current Outpatient Prescriptions on File Prior to Encounter  Medication Sig Dispense Refill  . metroNIDAZOLE (FLAGYL) 500 MG tablet Take 1 tablet (500 mg total) by mouth 2 (two) times daily.  14 tablet  0  . Prenat-FeCbn-FeAspGl-FA-Omega (OB COMPLETE PETITE) 35-5-1-200 MG CAPS Take 1 tablet by mouth daily.  30 capsule  11    Pertinent items are noted in HPI.  Blood pressure 117/76, pulse 102, last menstrual period 04/04/2013. BP 117/76  Pulse 102  Ht 5\' 3"  (1.6 m)  Wt 123 lb (55.792 kg)  BMI 21.79 kg/m2  LMP 04/04/2013 General appearance: alert, cooperative and appears stated age Head: Normocephalic, without obvious abnormality, atraumatic Neck: supple, symmetrical, trachea midline Lungs: normal effort Heart: regular rate and rhythm Abdomen: soft, non-tender; bowel sounds normal; no masses,  no organomegaly and  gravid Pelvic: per Joellyn HaffKim Booker CNM--1 cm/80/-2, no pool, neg fern Extremities: extremities normal, atraumatic, no cyanosis or edema Pulses: 2+ and symmetric  U/S appears vertex, no measurable cervix with large funnel, AFI 13, bilateral hydronephrosis   Prenatal Transfer Tool  Maternal Diabetes: No Genetic Screening: Normal NT and Integrated screen Maternal Ultrasounds/Referrals: Abnormal:  Findings:   Fetal Kidney Anomalies bilateral hydronephrosis with female fetus Fetal Ultrasounds or other Referrals:  None Maternal Substance Abuse:  No Significant Maternal Medications:  None Significant Maternal Lab Results: None Has uterine didelphys   Lab Results  Component Value Date   WBC 15.1* 10/17/2013   HGB 11.0* 10/17/2013   HCT 33.1* 10/17/2013   MCV 80.7 10/17/2013   PLT 257 10/17/2013   Lab Results  Component Value Date   PREGTESTUR Positive 05/15/2013     Assessment/Plan Patient Active Problem List   Diagnosis Date Noted  . Premature rupture of membranes in pregnancy, antepartum 10/17/2013    Priority: High  . Supervision of normal pregnancy 07/04/2013    Priority: High  . Fetal hydronephrosis in pregnancy, antepartum condition 08/29/2013    Priority: Medium  . 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 Nosebleed 09/06/2013  . 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

## 2013-10-17 NOTE — Progress Notes (Signed)
Work-in: watery d/c x 2 days, enough that she has to wear pad and sleep on a towel, no big gush. Had BV last week, finishing up flagyl. +cramping x 2 days. Reports good fm, no vb. SSE: cx visually closed, creamy white nonodorous d/c, no pooling w/ valsalva, nitrazine neg, fern neg. SVE: 1/80/-2. U/S CL= no measurable cervix, + funneling, AFI nl, fetal bilateral hydronephrosis still present, discussed w/ [facility]- will send to antenatal for mag, bmz, prometrium. Dr. Jordan LikesSchmitz and antenatal charge RN notified.

## 2013-10-18 ENCOUNTER — Encounter (HOSPITAL_COMMUNITY): Payer: Self-pay | Admitting: *Deleted

## 2013-10-18 ENCOUNTER — Ambulatory Visit (HOSPITAL_COMMUNITY): Payer: Medicaid Other

## 2013-10-18 LAB — URINE CULTURE
CULTURE: NO GROWTH
Colony Count: NO GROWTH
Special Requests: NORMAL

## 2013-10-18 LAB — TYPE AND SCREEN
ABO/RH(D): A POS
ANTIBODY SCREEN: NEGATIVE

## 2013-10-18 LAB — GC/CHLAMYDIA PROBE AMP
CT PROBE, AMP APTIMA: NEGATIVE
GC Probe RNA: NEGATIVE

## 2013-10-18 LAB — ABO/RH: ABO/RH(D): A POS

## 2013-10-18 NOTE — Progress Notes (Signed)
Ur chart review completed.  

## 2013-10-18 NOTE — Progress Notes (Signed)
Patient ID: Sheila [name], female   DOB: 06/07/1990, 23 y.o.   MRN: [id number] FACULTY PRACTICE ANTEPARTUM(COMPREHENSIVE) NOTE  Sheila [name] is a 23 y.o. G1P0 at 4157w1d by early ultrasound who is admitted for rupture of membranes.   Fetal presentation is cephalic. Length of Stay:  1  Days  Subjective: Doing well. No complaints. Patient reports the fetal movement as active. Patient reports uterine contraction  activity as none. Patient reports  vaginal bleeding as none. Patient describes fluid per vagina as Clear.  Vitals:  Blood pressure 108/45, pulse 102, temperature 98 F (36.7 C), temperature source Oral, resp. rate 16, height 5\' 3"  (1.6 m), weight 123 lb (55.792 kg), last menstrual period 04/04/2013. Physical Examination:  General appearance - alert, well appearing, and in no distress Abdomen - gravid, NT Fundal Height:  size equals dates Extremities: extremities normal, atraumatic, no cyanosis or edema  Membranes:ruptured, clear fluid  Fetal Monitoring:  Baseline: 130 bpm, Variability: Good {> 6 bpm), Accelerations: Reactive and Decelerations: Absent  Labs:  Results for orders placed during the hospital encounter of 10/17/13 (from the past 24 hour(s))  CBC   Collection Time    10/17/13 12:10 PM      Result Value Ref Range   WBC 15.1 (*) 4.0 - 10.5 K/uL   RBC 4.10  3.87 - 5.11 MIL/uL   Hemoglobin 11.0 (*) 12.0 - 15.0 g/dL   HCT 95.233.1 (*) 84.136.0 - 32.446.0 %   MCV 80.7  78.0 - 100.0 fL   MCH 26.8  26.0 - 34.0 pg   MCHC 33.2  30.0 - 36.0 g/dL   RDW 40.112.2  02.711.5 - 25.315.5 %   Platelets 257  150 - 400 K/uL  AMNISURE RUPTURE OF MEMBRANE (ROM)   Collection Time    10/17/13 12:45 PM      Result Value Ref Range   Amnisure ROM POSITIVE    POCT URINALYSIS DIPSTICK   Collection Time    10/17/13  8:47 AM      Result Value Ref Range   Color, UA       Clarity, UA       Glucose, UA neg     Bilirubin, UA       Ketones, UA neg     Spec Grav, UA       Blood, UA 3+     pH, UA       Protein, UA neg     Urobilinogen, UA       Nitrite, UA neg     Leukocytes, UA Negative       Medications:  Scheduled . ampicillin (OMNIPEN) IV  2 g Intravenous Q6H   Followed by  . [START ON 10/19/2013] amoxicillin  500 mg Oral Q8H  . betamethasone acetate-betamethasone sodium phosphate  12 mg Intramuscular Q24H  . docusate sodium  100 mg Oral Daily  . erythromycin  250 mg Intravenous Q6H   Followed by  . [START ON 10/19/2013] erythromycin  250 mg Oral Q6H  . prenatal multivitamin  1 tablet Oral Q1200   I have reviewed the patient's current medications.  ASSESSMENT: Patient Active Problem List   Diagnosis Date Noted  . Premature rupture of membranes in pregnancy, antepartum 10/17/2013    Priority: High  . Supervision of normal pregnancy 07/04/2013    Priority: High  . Fetal hydronephrosis in pregnancy, antepartum condition 08/29/2013    Priority: Medium  . 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 Nosebleed  09/06/2013  . 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 Management.  Delivery with s/sx's of chorio. Latent Abx. S/p Magnesium.  Second BMZ today. MFM reviewed images and felt that further imaging was unnecessary unless she is still pregnant in 4 wks.  Reva Boresanya S Chason Mciver, MD 10/18/2013,6:58 AM

## 2013-10-19 MED ORDER — NIFEDIPINE 10 MG PO CAPS
20.0000 mg | ORAL_CAPSULE | Freq: Once | ORAL | Status: AC
  Administered 2013-10-19: 20 mg via ORAL
  Filled 2013-10-19: qty 2

## 2013-10-19 NOTE — Progress Notes (Signed)
FACULTY PRACTICE ANTEPARTUM(COMPREHENSIVE) NOTE  Sheila [name] is a 23 y.o. G1P0 at 3575w2d who is admitted for PROM.   Fetal presentation is cephalic. Length of Stay:  2  Days  Subjective: Pt is having ctx on toco but not feeling. Pt has no complaints at this time. Patient reports the fetal movement as active. Patient reports uterine contraction  activity as irregular, every 2-4 minutes. Patient reports  vaginal bleeding as none. Patient describes fluid per vagina as Clear.  Vitals:  Blood pressure 109/68, pulse 89, temperature 97.7 F (36.5 C), temperature source Oral, resp. rate 18, height 5\' 3"  (1.6 m), weight 55.792 kg (123 lb), last menstrual period 04/04/2013, SpO2 96.00%. Physical Examination:  General appearance - alert, well appearing, and in no distress and oriented to person, place, and time Heart - normal rate and regular rhythm Abdomen - soft, nontender, nondistended Fundal Height:  size equals dates Cervical Exam: Not evaluated. 1/80 5/19. Extremities: extremities normal, atraumatic, no cyanosis or edema and Homans sign is negative, no sign of DVT with DTRs 2+ bilaterally Membranes:ruptured, clear fluid  Fetal Monitoring:  Baseline: 140s bpm, Variability: Good {> 6 bpm), Accelerations: Reactive and Decelerations: Absent  Labs:  No results found for this or any previous visit (from the past 24 hour(s)).  Imaging Studies:     Medications:  Scheduled . ampicillin (OMNIPEN) IV  2 g Intravenous Q6H   Followed by  . amoxicillin  500 mg Oral Q8H  . docusate sodium  100 mg Oral Daily  . erythromycin  250 mg Intravenous Q6H   Followed by  . erythromycin  250 mg Oral Q6H  . NIFEdipine  20 mg Oral Once  . prenatal multivitamin  1 tablet Oral Q1200   I have reviewed the patient's current medications.  ASSESSMENT: Sheila [name] is a 23 y.o. G1P0 at 4475w2d by early ultrasound who is admitted for PROM.    Patient Active Problem List   Diagnosis Date Noted  .  Preterm labor 10/18/2013  . Premature rupture of membranes in pregnancy, antepartum 10/17/2013  . Nosebleed 09/06/2013  . Fetal hydronephrosis in pregnancy, antepartum condition 08/29/2013  . Supervision of normal pregnancy 07/04/2013  . 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

## 2013-10-19 NOTE — Progress Notes (Signed)
Procardia 20mg  given PO

## 2013-10-19 NOTE — Progress Notes (Signed)
10/19/13 1300  Clinical Encounter Type  Visited With Patient  Visit Type Initial;Spiritual support;Social support  Referral From Nurse Kassie Mends, RN)  Spiritual Encounters  Spiritual Needs Emotional  Stress Factors  Patient Stress Factors Loss of control (bored in ante)   Met Eyonna to introduce Spiritual Care and chaplain availability.  She was quiet and reserved, reporting strongest support from her mom.  Please page as needs arise.  Thank you.  Sheila [name], Sheila [name]

## 2013-10-20 DIAGNOSIS — O4290 Premature rupture of membranes, unspecified as to length of time between rupture and onset of labor, unspecified weeks of gestation: Secondary | ICD-10-CM

## 2013-10-20 DIAGNOSIS — O26879 Cervical shortening, unspecified trimester: Secondary | ICD-10-CM

## 2013-10-20 DIAGNOSIS — O3400 Maternal care for unspecified congenital malformation of uterus, unspecified trimester: Secondary | ICD-10-CM

## 2013-10-20 DIAGNOSIS — O98519 Other viral diseases complicating pregnancy, unspecified trimester: Secondary | ICD-10-CM

## 2013-10-20 LAB — CULTURE, BETA STREP (GROUP B ONLY)

## 2013-10-20 MED ORDER — VALACYCLOVIR HCL 500 MG PO TABS
1000.0000 mg | ORAL_TABLET | Freq: Every day | ORAL | Status: DC
  Administered 2013-10-20 – 2013-10-25 (×6): 1000 mg via ORAL
  Filled 2013-10-20 (×7): qty 2

## 2013-10-20 MED ORDER — SODIUM CHLORIDE 0.9 % IJ SOLN
3.0000 mL | Freq: Two times a day (BID) | INTRAMUSCULAR | Status: DC
  Administered 2013-10-20 – 2013-10-25 (×11): 3 mL via INTRAVENOUS

## 2013-10-20 NOTE — Progress Notes (Addendum)
FACULTY PRACTICE ANTEPARTUM(COMPREHENSIVE) NOTE  Sheila [name] is a 23 y.o. G1P0 at [redacted]w[redacted]d who is admitted for PROM.   Fetal presentation is cephalic. Length of Stay:  4  Days  Subjective: Pt is having ctx on toco but not feeling. Pt has no complaints at this time. Patient reports the fetal movement as active. She denies contractions Patient reports  vaginal bleeding as none. Patient describes fluid per vagina as Clear.  Vitals:  Blood pressure 124/62, pulse 101, temperature 98.1 F (36.7 C), temperature source Oral, resp. rate 24, height 5\' 3"  (1.6 m), weight 55.792 kg (123 lb), last menstrual period 04/04/2013, SpO2 96.00%. Physical Examination:  General appearance - alert, well appearing, and in no distress and oriented to person, place, and time Heart - normal rate and regular rhythm Abdomen - soft, nontender, nondistended Fundal Height:  size equals dates Cervical Exam: Not evaluated. 1/80 5/19. Extremities: extremities normal, atraumatic, no cyanosis or edema and Homans sign is negative, no sign of DVT with DTRs 2+ bilaterally Membranes:ruptured, clear fluid  Fetal Monitoring:  Baseline: 140s bpm, Variability: Good {> 6 bpm), Accelerations: Reactive and Decelerations: Absent  Labs:  No results found for this or any previous visit (from the past 24 hour(s)).  Imaging Studies:     Medications:  Scheduled . amoxicillin  500 mg Oral Q8H  . docusate sodium  100 mg Oral Daily  . erythromycin  250 mg Oral Q6H  . prenatal multivitamin  1 tablet Oral Q1200   I have reviewed the patient's current medications.  ASSESSMENT: Sheila [name] is a 23 y.o. G1P0 at [redacted]w[redacted]d by early ultrasound who is admitted for PROM.    Patient Active Problem List   Diagnosis Date Noted  . Preterm labor 10/18/2013  . Premature rupture of membranes in pregnancy, antepartum 10/17/2013  . Nosebleed 09/06/2013  . Fetal hydronephrosis in pregnancy, antepartum condition 08/29/2013  . Supervision of  normal pregnancy 07/04/2013  . 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

## 2013-10-21 NOTE — Progress Notes (Signed)
Patient ID: Sheila [name], female   DOB: 1991/04/22, 22 y.o.   MRN: [id number] ACULTY PRACTICE ANTEPARTUM COMPREHENSIVE PROGRESS NOTE  Sheila [name] is a 23 y.o. G1P0 at [redacted]w[redacted]d  who is admitted for PROM.   Fetal presentation is cephalic. Length of Stay:  4  Days  Subjective: Pt reports continued LOF.  No VB.  No ctx. Patient reports good fetal movement.  Vitals:  Blood pressure 107/48, pulse 94, temperature 98.4 F (36.9 C), temperature source Oral, resp. rate 18, height 5\' 3"  (1.6 m), weight 123 lb (55.792 kg), last menstrual period 04/04/2013, SpO2 98.00%. Physical Examination: General appearance - alert, well appearing, and in no distress Abdomen - soft, nontender, nondistended, no masses or organomegaly gravid Extremities - peripheral pulses normal, no pedal edema, no clubbing or cyanosis. SCD's in place Cervical Exam: Not evaluated.  Membranes:ruptured  Fetal Monitoring:  Baseline: 140's bpm, Variability: Good {> 6 bpm), Accelerations: Reactive and Decelerations: occ variable decels  Labs:  No results found for this or any previous visit (from the past 24 hour(s)).  Imaging Studies:    None new   Medications:  Scheduled . amoxicillin  500 mg Oral Q8H  . docusate sodium  100 mg Oral Daily  . erythromycin  250 mg Oral Q6H  . prenatal multivitamin  1 tablet Oral Q1200  . sodium chloride  3 mL Intravenous Q12H  . valACYclovir  1,000 mg Oral Daily   I have reviewed the patient's current medications.  ASSESSMENT: Patient Active Problem List   Diagnosis Date Noted  . Preterm labor 10/18/2013  . Premature rupture of membranes in pregnancy, antepartum 10/17/2013  . Nosebleed 09/06/2013  . Fetal hydronephrosis in pregnancy, antepartum condition 08/29/2013  . Supervision of normal pregnancy 07/04/2013  . 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   Melvin Whiteford  Harraway-Smith 10/21/2013,7:02 AM

## 2013-10-22 MED ORDER — POLYETHYLENE GLYCOL 3350 17 G PO PACK
17.0000 g | PACK | Freq: Every day | ORAL | Status: DC | PRN
  Filled 2013-10-22: qty 1

## 2013-10-22 NOTE — Progress Notes (Signed)
Patient ID: Sheila [name], female   DOB: 1990/08/10, 23 y.o.   MRN: [id number] ACULTY PRACTICE ANTEPARTUM COMPREHENSIVE PROGRESS NOTE  Sheila [name] is a 23 y.o. G1P0 at [redacted]w[redacted]d  who is admitted for PROM.   Fetal presentation is cephalic. Length of Stay:  5  Days  Subjective: Pt reports continued LOF.  No VB.  No ctx. Patient reports good fetal movement.  Vitals:  Blood pressure 123/63, pulse 99, temperature 97.7 F (36.5 C), temperature source Oral, resp. rate 18, height 5\' 3"  (1.6 m), weight 123 lb (55.792 kg), last menstrual period 04/04/2013, SpO2 98.00%. Physical Examination: General appearance - alert, well appearing, and in no distress Abdomen - soft, nontender, nondistended, no masses or organomegaly gravid Extremities - peripheral pulses normal, no pedal edema, no clubbing or cyanosis. SCD's in place Cervical Exam: Not evaluated.  Membranes:ruptured  Fetal Monitoring:  Baseline: 140's bpm, Variability: Good {> 6 bpm), Accelerations: Reactive and Decelerations: occ variable decels  Labs:  No results found for this or any previous visit (from the past 24 hour(s)).  Imaging Studies:    None new   Medications:  Scheduled . amoxicillin  500 mg Oral Q8H  . docusate sodium  100 mg Oral Daily  . erythromycin  250 mg Oral Q6H  . prenatal multivitamin  1 tablet Oral Q1200  . sodium chloride  3 mL Intravenous Q12H  . valACYclovir  1,000 mg Oral Daily   I have reviewed the patient's current medications.  ASSESSMENT: Patient Active Problem List   Diagnosis Date Noted  . Preterm labor 10/18/2013  . Premature rupture of membranes in pregnancy, antepartum 10/17/2013  . Nosebleed 09/06/2013  . Fetal hydronephrosis in pregnancy, antepartum condition 08/29/2013  . Supervision of normal pregnancy 07/04/2013  . 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   Tereso Newcomer, MD 10/22/2013,7:52 AM

## 2013-10-23 NOTE — Progress Notes (Signed)
Ur chart review completed.  

## 2013-10-23 NOTE — Progress Notes (Signed)
Patient ID: Sheila [name], female   DOB: March 20, 1991, 23 y.o.   MRN: [id number] Sheila [name] is a 23 y.o. G1P0 at [redacted]w[redacted]d who is admitted for PROM.  Fetal presentation is cephalic.  Length of Stay: 4 Days  Subjective:  Pt reports continued LOF. No VB. No ctx.  Patient reports good fetal movement. Vitals:BP 114/64  Pulse 102  Temp(Src) 98.6 F (37 C) (Oral)  Resp 16  Ht 5\' 3"  (1.6 m)  Wt 123 lb (55.792 kg)  BMI 21.79 kg/m2  SpO2 98%  LMP 04/04/2013   Physical Examination:  General appearance - alert, well appearing, and in no distress  Abdomen - soft, nontender, nondistended, no masses or organomegaly  gravid  Extremities - peripheral pulses normal, no pedal edema, no clubbing or cyanosis. SCD's in place  Cervical Exam: Not evaluated.  Membranes:ruptured  Fetal Monitoring: Baseline: 150's bpm NST this am pending.  Toco: no ctx Labs:  No results found for this or any previous visit (from the past 24 hour(s)).  Imaging Studies:  None new  Medications: Scheduled  .  amoxicillin  500 mg  Oral  Q8H   .  docusate sodium  100 mg  Oral  Daily   .  erythromycin  250 mg  Oral  Q6H   .  prenatal multivitamin  1 tablet  Oral  Q1200   .  sodium chloride  3 mL  Intravenous  Q12H   .  valACYclovir  1,000 mg  Oral  Daily   I have reviewed the patient's current medications.  ASSESSMENT:  Patient Active Problem List    Diagnosis  Date Noted   .  Preterm labor  10/18/2013   .  Premature rupture of membranes in pregnancy, antepartum  10/17/2013   .  Nosebleed  09/06/2013   .  Fetal hydronephrosis in pregnancy, antepartum condition  08/29/2013   .  Supervision of normal pregnancy  07/04/2013   .  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

## 2013-10-24 NOTE — Progress Notes (Signed)
Antenatal Nutrition Assessment:  Currently  [redacted] weeks gestation, with PROM. Height  63 "  Weight 123 lbs  pre-pregnancy weight 112 lbs .  Pre-pregnancy  BMI 19.9  IBW 115 lbs Total weight gain 11.lbs Weight gain goals 25-35 lbs Estimated needs: 1600-1800 kcal/day, 75-85 grams protein/day, 2 liters fluid/day  Regular diet tolerated well, appetite good. Snack menu provided Current diet prescription will provide for increased needs.  No abnormal nutrition related labs  Nutrition Dx: Increased nutrient needs r/t pregnancy and fetal growth requirements aeb [redacted] weeks gestation.  No educational needs assessed at this time.  Sheila [name] M.Odis Luster LDN Neonatal Nutrition Support Specialist Pager [phone]

## 2013-10-24 NOTE — Progress Notes (Signed)
Patient ID: Sheila [name], female   DOB: September 08, 1990, 23 y.o.   MRN: [id number] [facility] ANTEPARTUM COMPREHENSIVE PROGRESS NOTE  Sheila [name] is a 23 y.o. G1P0 at [redacted]w[redacted]d  who is admitted for PROM.   Fetal presentation is cephalic. Length of Stay:  7  Days  Subjective: Pt reports continued LOF.  No VB.  No ctx.  Patient reports good fetal movement.   Vitals:  Blood pressure 107/46, pulse 101, temperature 98.6 F (37 C), temperature source Oral, resp. rate 16, height 5\' 3"  (1.6 m), weight 123 lb (55.792 kg), last menstrual period 04/04/2013, SpO2 98.00%. Physical Examination: General appearance - alert, well appearing, and in no distress Abdomen - soft, nontender, nondistended, no masses or organomegaly gravid Extremities - peripheral pulses normal, no pedal edema, no clubbing or cyanosis. SCD's in place Cervical Exam: Not evaluated.  Membranes:ruptured  Fetal Monitoring:  Baseline: 140's bpm, Variability: Good {> 6 bpm), Accelerations: Reactive and Decelerations: occ variable decels  Labs:  No results found for this or any previous visit (from the past 24 hour(s)).  Imaging Studies:    None new   Medications:  Scheduled . docusate sodium  100 mg Oral Daily  . erythromycin  250 mg Oral Q6H  . prenatal multivitamin  1 tablet Oral Q1200  . sodium chloride  3 mL Intravenous Q12H  . valACYclovir  1,000 mg Oral Daily   I have reviewed the patient's current medications.  ASSESSMENT: Patient Active Problem List   Diagnosis Date Noted  . Preterm labor 10/18/2013  . Premature rupture of membranes in pregnancy, antepartum 10/17/2013  . Nosebleed 09/06/2013  . Fetal hydronephrosis in pregnancy, antepartum condition 08/29/2013  . Supervision of normal pregnancy 07/04/2013  . 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   Tereso Newcomer, MD 10/24/2013,7:05  AM

## 2013-10-25 ENCOUNTER — Other Ambulatory Visit: Payer: Medicaid Other

## 2013-10-25 ENCOUNTER — Encounter: Payer: Medicaid Other | Admitting: Women's Health

## 2013-10-25 LAB — CBC
HEMATOCRIT: 31.6 % — AB (ref 36.0–46.0)
Hemoglobin: 10.3 g/dL — ABNORMAL LOW (ref 12.0–15.0)
MCH: 26.7 pg (ref 26.0–34.0)
MCHC: 32.6 g/dL (ref 30.0–36.0)
MCV: 81.9 fL (ref 78.0–100.0)
PLATELETS: 223 10*3/uL (ref 150–400)
RBC: 3.86 MIL/uL — ABNORMAL LOW (ref 3.87–5.11)
RDW: 12.6 % (ref 11.5–15.5)
WBC: 17.3 10*3/uL — AB (ref 4.0–10.5)

## 2013-10-25 LAB — GLUCOSE TOLERANCE, 1 HOUR: GLUCOSE 1 HOUR GTT: 112 mg/dL (ref 70–140)

## 2013-10-25 LAB — AMNISURE RUPTURE OF MEMBRANE (ROM) NOT AT ARMC: AMNISURE: NEGATIVE

## 2013-10-25 LAB — RPR

## 2013-10-25 LAB — HIV ANTIBODY (ROUTINE TESTING W REFLEX): HIV 1&2 Ab, 4th Generation: NONREACTIVE

## 2013-10-25 MED ORDER — VALACYCLOVIR HCL 1 G PO TABS
1000.0000 mg | ORAL_TABLET | Freq: Every day | ORAL | Status: DC
Start: 2013-10-25 — End: 2014-11-07

## 2013-10-25 MED ORDER — PROGESTERONE MICRONIZED 200 MG PO CAPS
200.0000 mg | ORAL_CAPSULE | Freq: Once | ORAL | Status: DC
Start: 2013-10-25 — End: 2013-10-25
  Filled 2013-10-25: qty 1

## 2013-10-25 MED ORDER — PROGESTERONE MICRONIZED 200 MG PO CAPS: ORAL_CAPSULE | ORAL | Status: DC

## 2013-10-25 MED ORDER — TETANUS-DIPHTH-ACELL PERTUSSIS 5-2.5-18.5 LF-MCG/0.5 IM SUSP
0.5000 mL | Freq: Once | INTRAMUSCULAR | Status: AC
  Administered 2013-10-25: 0.5 mL via INTRAMUSCULAR
  Filled 2013-10-25: qty 0.5

## 2013-10-25 NOTE — Progress Notes (Signed)
Sheila [name] is a 23 y.o. G1P0 at [redacted]w[redacted]d who is admitted for PROM.  Fetal presentation is cephalic.  Length of Stay: 8 Days  Subjective:  Pt reports continued LOF. No VB. No ctx. Patient reports good fetal movement.  Vitals: Blood pressure 107/46, pulse 101, temperature 98.6 F (37 C), temperature source Oral, resp. rate 16, height 5\' 3"  (1.6 m), weight 123 lb (55.792 kg), last menstrual period 04/04/2013, SpO2 98.00%.  Physical Examination:  General appearance - alert, well appearing, and in no distress  Abdomen - soft, nontender, nondistended, no masses or organomegaly  gravid  Extremities - peripheral pulses normal, no pedal edema, no clubbing or cyanosis. SCD's in place  Cervical Exam: Not evaluated.  Membranes:ruptured  Fetal Monitoring: Baseline: 140's bpm, Variability: Good {> 6 bpm), Accelerations: Reactive and Decelerations: occ variable decels  Labs:  No results found for this or any previous visit (from the past 24 hour(s)).  Imaging Studies:  None new  Medications: Scheduled  .  docusate sodium  100 mg  Oral  Daily   .  erythromycin  250 mg  Oral  Q6H   .  prenatal multivitamin  1 tablet  Oral  Q1200   .  sodium chloride  3 mL  Intravenous  Q12H   .  valACYclovir  1,000 mg  Oral  Daily   I have reviewed the patient's current medications.  ASSESSMENT:  Patient Active Problem List    Diagnosis  Date Noted   .  Preterm labor  10/18/2013   .  Premature rupture of membranes in pregnancy, antepartum  10/17/2013   .  Nosebleed  09/06/2013   .  Fetal hydronephrosis in pregnancy, antepartum condition  08/29/2013   .  Supervision of normal pregnancy  07/04/2013   .  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  Consider repeating a SSE. 28 week labs and tdap today.

## 2013-10-25 NOTE — Progress Notes (Signed)
Patient ID: Sheila [name], female   DOB: February 24, 1991, 23 y.o.   MRN: [id number]  R/o rupture redone today due to no LOF in the last 3 days and initial +amnisure with neg pool and fern.  - no lof, vb, ctx. +FM.   O:  NEFG, vaginal vault clear with some white normal pregnancy discharge. Neg pool both normally and with valsalva, neg fern.   A/P - Amnisure neg - neg pool and neg fern - likely either "reseal" or false positive amnisure initially - will plan to d/c home on prometrium due to hx of shortened cervix.   Vale Haven, MD

## 2013-10-25 NOTE — Discharge Summary (Signed)
Antenatal Physician Discharge Summary  Patient ID: Sheila [name] MRN: [id number] DOB/AGE: Jun 11, 1990 23 y.o.  Admit date: 10/17/2013 Discharge date: 10/25/2013  Admission Diagnoses: shortened cervix, PPROM  Discharge Diagnoses: shortened cervix, possible "resealing" of the amniotic sac.   Prenatal Procedures: NST and ultrasound  Intrapartum Procedures: Neonatology   Significant Diagnostic Studies:  Results for orders placed during the hospital encounter of 10/17/13 (from the past 168 hour(s))  RPR   Collection Time    10/25/13 11:55 AM      Result Value Ref Range   RPR NON REAC  NON REAC  CBC   Collection Time    10/25/13 11:55 AM      Result Value Ref Range   WBC 17.3 (*) 4.0 - 10.5 K/uL   RBC 3.86 (*) 3.87 - 5.11 MIL/uL   Hemoglobin 10.3 (*) 12.0 - 15.0 g/dL   HCT 90.3 (*) 83.3 - 38.3 %   MCV 81.9  78.0 - 100.0 fL   MCH 26.7  26.0 - 34.0 pg   MCHC 32.6  30.0 - 36.0 g/dL   RDW 29.1  91.6 - 60.6 %   Platelets 223  150 - 400 K/uL  GLUCOSE TOLERANCE, 1 HOUR   Collection Time    10/25/13 11:55 AM      Result Value Ref Range   Glucose, 1 Hour GTT 112  70 - 140 mg/dL  AMNISURE RUPTURE OF MEMBRANE (ROM)   Collection Time    10/25/13  1:25 PM      Result Value Ref Range   Amnisure ROM NEGATIVE      Treatments: IV hydration, amp/erythromycin,   Hospital Course:  This is a 23 y.o. G1P0 with IUP at [redacted]w[redacted]d admitted for shortened cervix and with complaints of LOF.  She had a negative pool and fern but + amnisure.  She was admitted, given BMZ on 5/19 and 5/20, CP magnesium and ampicillin and erythromycin x1 week. She was monitored daily without any change in her clinical condition. 6 days into her hospital stay she stopped complaining of any vaginal discharge and on day 9, the decision was made to r/o ROM again. She had a negative speculum exam with no pool and no fern and visually at least 1cm of cervix that appeared closed. Her amnisure was negative.  Given this, it was felt  that either she had never actually [facility] or she had resealed the leak that was there.   Pt was then discharged to home on PV progesterone on strict activity precautions and no intercourse or other vaginal stimulation. She will f/u in 2 days at [facility] for a visit and possibly another [place] as they see fit. Fetal heart rate monitoring remained reassuring throughout her stay and she showed no signs of infection or preterm labor.    Discharge Exam: BP 111/62  Pulse 119  Temp(Src) 98 F (36.7 C) (Oral)  Resp 20  Ht 5\' 3"  (1.6 m)  Wt 55.339 kg (122 lb)  BMI 21.62 kg/m2  SpO2 98%  LMP 04/04/2013 General appearance: alert, cooperative and no distress Resp: clear to auscultation bilaterally Cardio: regular rate and rhythm, S1, S2 normal, no murmur, click, rub or gallop GI: soft, non-tender; bowel sounds normal; no masses,  no organomegaly Extremities: extremities normal, atraumatic, no cyanosis or edema Pulses: 2+ and symmetric Skin: Skin color, texture, turgor normal. No rashes or lesions  Discharge Condition: good  Disposition: Final discharge disposition not confirmed  Discharge Instructions   Discharge patient    Complete  by:  As directed   To home            Medication List    STOP taking these medications       metroNIDAZOLE 500 MG tablet  Commonly known as:  FLAGYL      TAKE these medications       OB COMPLETE PETITE 35-5-1-200 MG Caps  Take 1 tablet by mouth daily.     progesterone 200 MG capsule  Commonly known as:  PROMETRIUM  Place one capsule vaginally at bedtime     valACYclovir 1000 MG tablet  Commonly known as:  VALTREX  Take 1 tablet (1,000 mg total) by mouth daily.           Follow-up Information   [facility]. Schedule an appointment as soon as possible for a visit in 2 days.   Specialty:  Obstetrics and Gynecology   Contact information:   [address] [place] [address] [phone]      Signed: Vale HavenKeli L Joncarlos Atkison  M.D. 10/25/2013, 4:43 PM

## 2013-10-25 NOTE — Progress Notes (Signed)
Pt left with dose of prometrium due to pharmacy at home for dc meds is not open now Dr. Reola Calkins notified and ordered dose to go home with pt  Explained to pt how to administer no questions asked no concerns noted

## 2013-10-25 NOTE — Progress Notes (Signed)
Sheila [name] is having a difficult time being here in the hospital--she is feeling isolated from most of her family and friends, who live an hour away.  We talked about some of the things that she enjoys doing and she mentioned that doodling and coloring were relaxing for her.  I printed off some coloring pages for her and can replenish as needed.  We will continue to follow up with her as we are able.  [address] [place] Pager, [phone] 3:51 PM   10/25/13 1500  Clinical Encounter Type  Visited With Patient  Visit Type Spiritual support  Referral From Nurse  Spiritual Encounters  Spiritual Needs Emotional

## 2013-10-25 NOTE — Discharge Instructions (Signed)
Cervical Insufficiency  °Cervical insufficiency is when the cervix is weak and starts to open (dilate) and thin (efface) before the pregnancy is at term and without labor starting. This is also called incompetent cervix. It can happen in the second or third trimester when the fetus starts putting pressure on the cervix. Cervical insufficiency can lead to a miscarriage, preterm premature rupture of the membranes (PPROM), or having the baby early (preterm birth).  °RISK FACTORS °You may be more likely to develop cervical insufficiency if: °· You have a shorter cervix than normal. °· Damage or injury occurred to your cervix from a past pregnancy or surgery. °· You were born with a cervical defect. °· You have had procedure done on the cervix, such as cervical biopsy. °· You have a history of cervical insufficiency. °· You have a history of PPROM. °· You have ended several past pregnancies through abortion. °· You were exposed to the drug diethylstilbestrol (DES). °SYMPTOMS °Often times, women do not have any symptoms. Other times, woman may only have mild symptoms that often start between week 14 through 20. The symptoms may last several days or weeks. These symptoms include: °· Light spotting or bleeding from the vagina. °· Pelvic pressure. °· A change in vaginal discharge, such as discharge that changes from clear, white, or light yellow to pink or tan. °· Back pain. °· Abdominal pain or cramping. °DIAGNOSIS °Cervical insufficiency cannot be diagnosed before you become pregnant. Once you are pregnant, your caregiver will ask about your medical history and if you have had any problems in past pregnancies. Tell your caregiver about any procedures performed on your cervix or if you have a history of miscarriages or cervical insufficiency. If your caregiver thinks you are at high risk for cervical insufficiency or show signs of cervical insufficiency, he or she may: °· Perform a pelvic exam. This will check for: °· The  presence of the membranes (amniotic sac) coming out of the cervix. °· Cervical abnormalities. °· Cervical injuries. °· The presence of contractions. °· Perform an ultrasonography (commonly called ultrasound) to measure the length and thickness of the cervix. °TREATMENT °If you have been diagnosed with cervical insufficiency, your caregiver may recommend: °· Limiting physical activity. °· Bed rest at home or in the hospital. °· Pelvic rest, which means no sexual intercourse or placing anything in the vagina. °· Cerclage to sew the cervix closed and prevent it from opening too early. The stitches (sutures) are removed between weeks 36 and 38 to avoid problems during labor. °Cerclage may be recommended during pregnancy if you have had a history of miscarriages or preterm births without a known cause. It may also be recommended if you have a short cervix that was identified by ultrasound or if your caregiver has found that your cervix has dilated before 24 weeks of pregnancy. Limiting physical activity and bed rest may or may not help prevent a preterm birth. °WHEN SHOULD YOU SEEK IMMEDIATE MEDICAL CARE?  °Seek immediate medical care if you show any symptoms of cervical insufficiency. You will need to go to the hospital to get checked immediately. °Document Released: 05/18/2005 Document Revised: 01/18/2013 Document Reviewed: 07/25/2012 °Exit[employer]® Patient Information ©2014 [employer]. ° °

## 2013-10-27 ENCOUNTER — Ambulatory Visit (INDEPENDENT_AMBULATORY_CARE_PROVIDER_SITE_OTHER): Payer: Medicaid Other | Admitting: Obstetrics & Gynecology

## 2013-10-27 ENCOUNTER — Other Ambulatory Visit: Payer: Self-pay | Admitting: Obstetrics & Gynecology

## 2013-10-27 ENCOUNTER — Encounter: Payer: Self-pay | Admitting: Obstetrics & Gynecology

## 2013-10-27 ENCOUNTER — Other Ambulatory Visit (INDEPENDENT_AMBULATORY_CARE_PROVIDER_SITE_OTHER): Payer: Medicaid Other

## 2013-10-27 VITALS — BP 120/60 | Wt 123.0 lb

## 2013-10-27 DIAGNOSIS — O26879 Cervical shortening, unspecified trimester: Secondary | ICD-10-CM

## 2013-10-27 DIAGNOSIS — Z1389 Encounter for screening for other disorder: Secondary | ICD-10-CM

## 2013-10-27 DIAGNOSIS — O3430 Maternal care for cervical incompetence, unspecified trimester: Secondary | ICD-10-CM

## 2013-10-27 DIAGNOSIS — O09899 Supervision of other high risk pregnancies, unspecified trimester: Secondary | ICD-10-CM

## 2013-10-27 DIAGNOSIS — Z331 Pregnant state, incidental: Secondary | ICD-10-CM

## 2013-10-27 LAB — POCT URINALYSIS DIPSTICK
Glucose, UA: NEGATIVE
Ketones, UA: NEGATIVE
Leukocytes, UA: NEGATIVE
NITRITE UA: NEGATIVE
Protein, UA: NEGATIVE
RBC UA: NEGATIVE

## 2013-10-27 NOTE — Progress Notes (Signed)
U/S(28+3wks)-vtx active fetus FHR- 159bpm, CX-0.8cm with funnel noted (limited u/s to check cx)

## 2013-10-27 NOTE — Progress Notes (Signed)
Sonogram report is reviewed and done, good fluid, cervix intervally a little better Continue prometrium, status post betamethasone and Mg prophylaxis  Follow up 2 weeks  G1P0 [redacted]w[redacted]d Estimated Date of Delivery: 01/16/14   BP weight and urine results all reviewed and noted. Patient reports good fetal movement, denies any bleeding and no rupture of membranes symptoms or regular contractions.  Patient is without complaints. All questions were answered.  Follow up in 2 weeks for  OB appt

## 2013-10-27 NOTE — Addendum Note (Signed)
Addended by: Criss Alvine on: 10/27/2013 01:26 PM   Modules accepted: Orders

## 2013-11-13 ENCOUNTER — Ambulatory Visit (INDEPENDENT_AMBULATORY_CARE_PROVIDER_SITE_OTHER): Payer: Medicaid Other | Admitting: Women's Health

## 2013-11-13 ENCOUNTER — Encounter: Payer: Self-pay | Admitting: Women's Health

## 2013-11-13 VITALS — BP 108/60 | Wt 130.0 lb

## 2013-11-13 DIAGNOSIS — O99019 Anemia complicating pregnancy, unspecified trimester: Secondary | ICD-10-CM | POA: Insufficient documentation

## 2013-11-13 DIAGNOSIS — O4290 Premature rupture of membranes, unspecified as to length of time between rupture and onset of labor, unspecified weeks of gestation: Secondary | ICD-10-CM

## 2013-11-13 DIAGNOSIS — O26879 Cervical shortening, unspecified trimester: Secondary | ICD-10-CM

## 2013-11-13 DIAGNOSIS — Z3490 Encounter for supervision of normal pregnancy, unspecified, unspecified trimester: Secondary | ICD-10-CM

## 2013-11-13 DIAGNOSIS — O09899 Supervision of other high risk pregnancies, unspecified trimester: Secondary | ICD-10-CM

## 2013-11-13 DIAGNOSIS — Z1389 Encounter for screening for other disorder: Secondary | ICD-10-CM

## 2013-11-13 DIAGNOSIS — Z331 Pregnant state, incidental: Secondary | ICD-10-CM

## 2013-11-13 DIAGNOSIS — IMO0002 Reserved for concepts with insufficient information to code with codable children: Secondary | ICD-10-CM

## 2013-11-13 DIAGNOSIS — O0990 Supervision of high risk pregnancy, unspecified, unspecified trimester: Secondary | ICD-10-CM

## 2013-11-13 LAB — POCT URINALYSIS DIPSTICK
Blood, UA: NEGATIVE
Glucose, UA: NEGATIVE
Ketones, UA: NEGATIVE
Leukocytes, UA: NEGATIVE
Nitrite, UA: NEGATIVE
PROTEIN UA: NEGATIVE

## 2013-11-13 MED ORDER — FUSION PLUS PO CAPS: 1.0000 | ORAL_CAPSULE | ORAL | Status: DC

## 2013-11-13 NOTE — Progress Notes (Signed)
High Risk Pregnancy Diagnosis(es): short cx w/ funneling, also uterine didelphys, and fetal bilateral hydronephrosis G1P0 6362w6d Estimated Date of Delivery: 01/16/14 Blood pressure 108/60, weight 130 lb (58.968 kg), last menstrual period 04/04/2013.  Urinalysis: Negative HPI:  Doing well, on modified bedrest, wants to know if she could get on float in pool- OK, but to wear sunblock and keep abd cool BP, weight, and urine results all reviewed and noted.  Reports good fm. Denies regular uc's, lof, vb, uti s/s. No complaints.  Fundal Height:  28 Fetal Heart rate:  134 Edema:  none  Reviewed ptl s/s, fkc. All questions were answered. Assessment: 7662w6d short cx w/ funneling, bilateral fetal hydronephrosis Medication(s) Plans:  Continue prometrium nightly Treatment Plan:  Continue pelvic rest, modified bed rest, po hydration Follow up asap for u/s to recheck fetal hydronephrosis and CL, then 2wks for high-risk OB appt

## 2013-11-13 NOTE — Patient Instructions (Signed)
Third Trimester of Pregnancy  The third trimester is from week 29 through week 42, months 7 through 9. The third trimester is a time when the fetus is growing rapidly. At the end of the ninth month, the fetus is about 20 inches in length and weighs 6 10 pounds.   BODY CHANGES  Your body goes through many changes during pregnancy. The changes vary from woman to woman.    Your weight will continue to increase. You can expect to gain 25 35 pounds (11 16 kg) by the end of the pregnancy.   You may begin to get stretch marks on your hips, abdomen, and breasts.   You may urinate more often because the fetus is moving lower into your pelvis and pressing on your bladder.   You may develop or continue to have heartburn as a result of your pregnancy.   You may develop constipation because certain hormones are causing the muscles that push waste through your intestines to slow down.   You may develop hemorrhoids or swollen, bulging veins (varicose veins).   You may have pelvic pain because of the weight gain and pregnancy hormones relaxing your joints between the bones in your pelvis. Back aches may result from over exertion of the muscles supporting your posture.   Your breasts will continue to grow and be tender. A yellow discharge may leak from your breasts called colostrum.   Your belly button may stick out.   You may feel short of breath because of your expanding uterus.   You may notice the fetus "dropping," or moving lower in your abdomen.   You may have a bloody mucus discharge. This usually occurs a few days to a week before labor begins.   Your cervix becomes thin and soft (effaced) near your due date.  WHAT TO EXPECT AT YOUR PRENATAL EXAMS   You will have prenatal exams every 2 weeks until week 36. Then, you will have weekly prenatal exams. During a routine prenatal visit:   You will be weighed to make sure you and the fetus are growing normally.   Your blood pressure is taken.   Your abdomen will be  measured to track your baby's growth.   The fetal heartbeat will be listened to.   Any test results from the previous visit will be discussed.   You may have a cervical check near your due date to see if you have effaced.  At around 36 weeks, your caregiver will check your cervix. At the same time, your caregiver will also perform a test on the secretions of the vaginal tissue. This test is to determine if a type of bacteria, Group B streptococcus, is present. Your caregiver will explain this further.  Your caregiver may ask you:   What your birth plan is.   How you are feeling.   If you are feeling the baby move.   If you have had any abnormal symptoms, such as leaking fluid, bleeding, severe headaches, or abdominal cramping.   If you have any questions.  Other tests or screenings that may be performed during your third trimester include:   Blood tests that check for low iron levels (anemia).   Fetal testing to check the health, activity level, and growth of the fetus. Testing is done if you have certain medical conditions or if there are problems during the pregnancy.  FALSE LABOR  You may feel small, irregular contractions that eventually go away. These are called Braxton Hicks contractions, or   false labor. Contractions may last for hours, days, or even weeks before true labor sets in. If contractions come at regular intervals, intensify, or become painful, it is best to be seen by your caregiver.   SIGNS OF LABOR    Menstrual-like cramps.   Contractions that are 5 minutes apart or less.   Contractions that start on the top of the uterus and spread down to the lower abdomen and back.   A sense of increased pelvic pressure or back pain.   A watery or bloody mucus discharge that comes from the vagina.  If you have any of these signs before the 37th week of pregnancy, call your caregiver right away. You need to go to the hospital to get checked immediately.  HOME CARE INSTRUCTIONS    Avoid all  smoking, herbs, alcohol, and unprescribed drugs. These chemicals affect the formation and growth of the baby.   Follow your caregiver's instructions regarding medicine use. There are medicines that are either safe or unsafe to take during pregnancy.   Exercise only as directed by your caregiver. Experiencing uterine cramps is a good sign to stop exercising.   Continue to eat regular, healthy meals.   Wear a good support bra for breast tenderness.   Do not use hot tubs, steam rooms, or saunas.   Wear your seat belt at all times when driving.   Avoid raw meat, uncooked cheese, cat litter boxes, and soil used by cats. These carry germs that can cause birth defects in the baby.   Take your prenatal vitamins.   Try taking a stool softener (if your caregiver approves) if you develop constipation. Eat more high-fiber foods, such as fresh vegetables or fruit and whole grains. Drink plenty of fluids to keep your urine clear or pale yellow.   Take warm sitz baths to soothe any pain or discomfort caused by hemorrhoids. Use hemorrhoid cream if your caregiver approves.   If you develop varicose veins, wear support hose. Elevate your feet for 15 minutes, 3 4 times a day. Limit salt in your diet.   Avoid heavy lifting, wear low heal shoes, and practice good posture.   Rest a lot with your legs elevated if you have leg cramps or low back pain.   Visit your dentist if you have not gone during your pregnancy. Use a soft toothbrush to brush your teeth and be gentle when you floss.   A sexual relationship may be continued unless your caregiver directs you otherwise.   Do not travel far distances unless it is absolutely necessary and only with the approval of your caregiver.   Take prenatal classes to understand, practice, and ask questions about the labor and delivery.   Make a trial run to the hospital.   Pack your hospital bag.   Prepare the baby's nursery.   Continue to go to all your prenatal visits as directed  by your caregiver.  SEEK MEDICAL CARE IF:   You are unsure if you are in labor or if your water has broken.   You have dizziness.   You have mild pelvic cramps, pelvic pressure, or nagging pain in your abdominal area.   You have persistent nausea, vomiting, or diarrhea.   You have a bad smelling vaginal discharge.   You have pain with urination.  SEEK IMMEDIATE MEDICAL CARE IF:    You have a fever.   You are leaking fluid from your vagina.   You have spotting or bleeding from your vagina.     You have severe abdominal cramping or pain.   You have rapid weight loss or gain.   You have shortness of breath with chest pain.   You notice sudden or extreme swelling of your face, hands, ankles, feet, or legs.   You have not felt your baby move in over an hour.   You have severe headaches that do not go away with medicine.   You have vision changes.  Document Released: 05/12/2001 Document Revised: 01/18/2013 Document Reviewed: 07/19/2012  [employer] Patient Information 2014 [employer].

## 2013-11-14 ENCOUNTER — Other Ambulatory Visit: Payer: Self-pay | Admitting: Obstetrics and Gynecology

## 2013-11-14 ENCOUNTER — Ambulatory Visit (INDEPENDENT_AMBULATORY_CARE_PROVIDER_SITE_OTHER): Payer: Medicaid Other

## 2013-11-14 DIAGNOSIS — O26879 Cervical shortening, unspecified trimester: Secondary | ICD-10-CM

## 2013-11-14 DIAGNOSIS — O0990 Supervision of high risk pregnancy, unspecified, unspecified trimester: Secondary | ICD-10-CM

## 2013-11-14 DIAGNOSIS — O09899 Supervision of other high risk pregnancies, unspecified trimester: Secondary | ICD-10-CM

## 2013-11-14 DIAGNOSIS — IMO0002 Reserved for concepts with insufficient information to code with codable children: Secondary | ICD-10-CM

## 2013-11-14 DIAGNOSIS — Q6239 Other obstructive defects of renal pelvis and ureter: Secondary | ICD-10-CM

## 2013-11-14 NOTE — Progress Notes (Signed)
U/S(31+0wks)-vtx active fetus, EFW 3 lb 6 oz (36th%tile), fluid WNL SDP-4.5cm, anterior Gr 1 placenta, bilateral mild hydronephrosis remains (5+mm) with bladder volume appears WNL, female fetus "Jagger", Cervix-357mm closed (measured vaginally)

## 2013-11-15 ENCOUNTER — Encounter: Payer: Self-pay | Admitting: Women's Health

## 2013-11-27 ENCOUNTER — Ambulatory Visit (INDEPENDENT_AMBULATORY_CARE_PROVIDER_SITE_OTHER): Payer: Medicaid Other | Admitting: Obstetrics & Gynecology

## 2013-11-27 ENCOUNTER — Encounter: Payer: Self-pay | Admitting: Obstetrics & Gynecology

## 2013-11-27 VITALS — BP 120/80 | Wt 132.0 lb

## 2013-11-27 DIAGNOSIS — O34593 Maternal care for other abnormalities of gravid uterus, third trimester: Secondary | ICD-10-CM

## 2013-11-27 DIAGNOSIS — Z1389 Encounter for screening for other disorder: Secondary | ICD-10-CM

## 2013-11-27 DIAGNOSIS — O3400 Maternal care for unspecified congenital malformation of uterus, unspecified trimester: Secondary | ICD-10-CM

## 2013-11-27 DIAGNOSIS — Q5128 Other doubling of uterus, other specified: Secondary | ICD-10-CM

## 2013-11-27 DIAGNOSIS — O09899 Supervision of other high risk pregnancies, unspecified trimester: Secondary | ICD-10-CM

## 2013-11-27 DIAGNOSIS — Z331 Pregnant state, incidental: Secondary | ICD-10-CM

## 2013-11-27 DIAGNOSIS — O26879 Cervical shortening, unspecified trimester: Secondary | ICD-10-CM

## 2013-11-27 LAB — POCT URINALYSIS DIPSTICK
Blood, UA: NEGATIVE
Glucose, UA: NEGATIVE
LEUKOCYTES UA: NEGATIVE
Nitrite, UA: NEGATIVE
PROTEIN UA: NEGATIVE

## 2013-11-27 NOTE — Progress Notes (Signed)
High Risk Pregnancy Diagnosis(es):   Short cervix, uterine didelphys, fetal hydronephrosis, mila and stable  G1P0 5483w6d Estimated Date of Delivery: 01/16/14  Blood pressure 120/80, weight 132 lb (59.875 kg), last menstrual period 04/04/2013.  Urinalysis: Negative   HPI: Having some lower pelvic cramping and lower back pain     BP weight and urine results all reviewed and noted. Patient reports good fetal movement, denies any bleeding and no rupture of membranes symptoms or regular contractions.  Fundal Height:  31  Fetal Heart rate:  140 Edema:  none  Patient is without complaints. All questions were answered.  Assessment:  2183w6d,   Short cervix with uterine didelphys  Medication(s) Plans:  No changes, continue prometrium nightly  Treatment Plan:  Continue pelvic rest, modify  Follow up in 2 weeks for OB appt,

## 2013-12-12 ENCOUNTER — Encounter: Payer: Self-pay | Admitting: Women's Health

## 2013-12-12 ENCOUNTER — Ambulatory Visit (INDEPENDENT_AMBULATORY_CARE_PROVIDER_SITE_OTHER): Payer: Medicaid Other | Admitting: Women's Health

## 2013-12-12 VITALS — BP 126/74 | Wt 139.0 lb

## 2013-12-12 DIAGNOSIS — Z331 Pregnant state, incidental: Secondary | ICD-10-CM

## 2013-12-12 DIAGNOSIS — R300 Dysuria: Secondary | ICD-10-CM

## 2013-12-12 DIAGNOSIS — O2390 Unspecified genitourinary tract infection in pregnancy, unspecified trimester: Secondary | ICD-10-CM

## 2013-12-12 DIAGNOSIS — O26893 Other specified pregnancy related conditions, third trimester: Secondary | ICD-10-CM

## 2013-12-12 DIAGNOSIS — Z1389 Encounter for screening for other disorder: Secondary | ICD-10-CM

## 2013-12-12 DIAGNOSIS — O3400 Maternal care for unspecified congenital malformation of uterus, unspecified trimester: Secondary | ICD-10-CM

## 2013-12-12 DIAGNOSIS — O26879 Cervical shortening, unspecified trimester: Secondary | ICD-10-CM

## 2013-12-12 DIAGNOSIS — O09899 Supervision of other high risk pregnancies, unspecified trimester: Secondary | ICD-10-CM

## 2013-12-12 DIAGNOSIS — O0993 Supervision of high risk pregnancy, unspecified, third trimester: Secondary | ICD-10-CM

## 2013-12-12 LAB — POCT URINALYSIS DIPSTICK
Blood, UA: NEGATIVE
Glucose, UA: NEGATIVE
KETONES UA: NEGATIVE
Nitrite, UA: NEGATIVE
Protein, UA: NEGATIVE

## 2013-12-12 NOTE — Progress Notes (Signed)
High Risk Pregnancy Diagnosis(es): short cx, uterine didelphys, fetal hydronephrosis- mild/stable G1P0 5491w0d Estimated Date of Delivery: 01/16/14 BP 126/74  Wt 139 lb (63.05 kg)  LMP 04/04/2013  Urinalysis: small leukocytes HPI:  Occ BH, were more frequent the other day, now only 1-2/day. Pressure/cramping w/ urination only. No other uti s/s.  Still on prometrium, pelvic rest BP, weight, and urine reviewed.  Reports good fm. Denies lof, vb.   Fundal Height:  155 Fetal Heart rate:  36 Edema:  trace  Reviewed ptl s/s, fkc All questions were answered Assessment: 1991w0d short cx, uterine didelphys, fetal bilateral hydronephrosis- mild/stable Medication(s) Plans:  Continue prometrium Treatment Plan:  Will send urine cx d/t sx Follow up in 1wk for high-risk OB appt

## 2013-12-12 NOTE — Patient Instructions (Signed)
Circumcision: $507 at hospital, [facility], has to be paid up front before it is done. If you [facility] you can make payments during pregnancy. If you are interested in this, see receptionist at check-out.  If your baby is older than 28 days when you have the circumcision done at [facility], the fee will go up to $325.50.    Call the office [phone]) or go to [facility] if:  You begin to have strong, frequent contractions  Your water breaks.  Sometimes it is a big gush of fluid, sometimes it is just a trickle that keeps getting your panties wet or running down your legs  You have vaginal bleeding.  It is normal to have a small amount of spotting if your cervix was checked.   You don't feel your baby moving like normal.  If you don't, get you something to eat and drink and lay down and focus on feeling your baby move.  You should feel at least 10 movements in 2 hours.  If you don't, you should call the office or go to [facility].    Preterm Labor Information Preterm labor is when labor starts at less than 37 weeks of pregnancy. The normal length of a pregnancy is 39 to 41 weeks. CAUSES Often, there is no identifiable underlying cause as to why a woman goes into preterm labor. One of the most common known causes of preterm labor is infection. Infections of the uterus, cervix, vagina, amniotic sac, bladder, kidney, or even the lungs (pneumonia) can cause labor to start. Other suspected causes of preterm labor include:   Urogenital infections, such as yeast infections and bacterial vaginosis.   Uterine abnormalities (uterine shape, uterine septum, fibroids, or bleeding from the placenta).   A cervix that has been operated on (it may fail to stay closed).   Malformations in the fetus.   Multiple gestations (twins, triplets, and so on).   Breakage of the amniotic sac.  RISK FACTORS  Having a previous history of preterm labor.    Having premature rupture of membranes (PROM).   Having a placenta that covers the opening of the cervix (placenta previa).   Having a placenta that separates from the uterus (placental abruption).   Having a cervix that is too weak to hold the fetus in the uterus (incompetent cervix).   Having too much fluid in the amniotic sac (polyhydramnios).   Taking illegal drugs or smoking while pregnant.   Not gaining enough weight while pregnant.   Being younger than 9918 and older than 22 years old.   Having a low socioeconomic status.   Being African American. SYMPTOMS Signs and symptoms of preterm labor include:   Menstrual-like cramps, abdominal pain, or back pain.  Uterine contractions that are regular, as frequent as six in an hour, regardless of their intensity (may be mild or painful).  Contractions that start on the top of the uterus and spread down to the lower abdomen and back.   A sense of increased pelvic pressure.   A watery or bloody mucus discharge that comes from the vagina.  TREATMENT Depending on the length of the pregnancy and other circumstances, your health care provider may suggest bed rest. If necessary, there are medicines that can be given to stop contractions and to mature the fetal lungs. If labor happens before 34 weeks of pregnancy, a prolonged hospital stay may be recommended. Treatment depends on the condition of both you  and the fetus.  WHAT SHOULD YOU DO IF YOU THINK YOU ARE IN PRETERM LABOR? Call your health care provider right away. You will need to go to the hospital to get checked immediately. HOW CAN YOU PREVENT PRETERM LABOR IN FUTURE PREGNANCIES? You should:   Stop smoking if you smoke.  Maintain healthy weight gain and avoid chemicals and drugs that are not necessary.  Be watchful for any type of infection.  Inform your health care provider if you have a known history of preterm labor. Document Released: 08/08/2003 Document  Revised: 01/18/2013 Document Reviewed: 06/20/2012 [facility] Patient Information 2015 [employer] [place]. This information is not intended to replace advice given to you by your health care provider. Make sure you discuss any questions you have with your health care provider.

## 2013-12-15 LAB — URINE CULTURE: Colony Count: 40000

## 2013-12-19 ENCOUNTER — Encounter (HOSPITAL_COMMUNITY): Payer: Self-pay | Admitting: *Deleted

## 2013-12-19 ENCOUNTER — Encounter: Payer: Medicaid Other | Admitting: Obstetrics & Gynecology

## 2013-12-19 ENCOUNTER — Inpatient Hospital Stay (HOSPITAL_COMMUNITY)
Admission: AD | Admit: 2013-12-19 | Discharge: 2013-12-25 | DRG: 765 | Disposition: A | Discharge status: Home / Self Care | Payer: Medicaid Other | Source: Ambulatory Visit | Attending: Obstetrics & Gynecology | Admitting: Obstetrics & Gynecology

## 2013-12-19 DIAGNOSIS — O98519 Other viral diseases complicating pregnancy, unspecified trimester: Secondary | ICD-10-CM | POA: Diagnosis present

## 2013-12-19 DIAGNOSIS — O411090 Infection of amniotic sac and membranes, unspecified, unspecified trimester, not applicable or unspecified: Secondary | ICD-10-CM | POA: Diagnosis present

## 2013-12-19 DIAGNOSIS — O324XX0 Maternal care for high head at term, not applicable or unspecified: Secondary | ICD-10-CM | POA: Diagnosis present

## 2013-12-19 DIAGNOSIS — O3460 Maternal care for abnormality of vagina, unspecified trimester: Secondary | ICD-10-CM | POA: Diagnosis present

## 2013-12-19 DIAGNOSIS — O26839 Pregnancy related renal disease, unspecified trimester: Secondary | ICD-10-CM | POA: Diagnosis present

## 2013-12-19 DIAGNOSIS — Q5211 Transverse vaginal septum: Secondary | ICD-10-CM | POA: Diagnosis not present

## 2013-12-19 DIAGNOSIS — D649 Anemia, unspecified: Secondary | ICD-10-CM | POA: Diagnosis present

## 2013-12-19 DIAGNOSIS — A6000 Herpesviral infection of urogenital system, unspecified: Secondary | ICD-10-CM | POA: Diagnosis present

## 2013-12-19 DIAGNOSIS — O3400 Maternal care for unspecified congenital malformation of uterus, unspecified trimester: Secondary | ICD-10-CM | POA: Diagnosis present

## 2013-12-19 DIAGNOSIS — Z98891 History of uterine scar from previous surgery: Secondary | ICD-10-CM

## 2013-12-19 DIAGNOSIS — O26879 Cervical shortening, unspecified trimester: Secondary | ICD-10-CM | POA: Diagnosis present

## 2013-12-19 DIAGNOSIS — Q512 Other doubling of uterus, unspecified: Secondary | ICD-10-CM

## 2013-12-19 DIAGNOSIS — IMO0002 Reserved for concepts with insufficient information to code with codable children: Secondary | ICD-10-CM | POA: Diagnosis not present

## 2013-12-19 DIAGNOSIS — O0990 Supervision of high risk pregnancy, unspecified, unspecified trimester: Secondary | ICD-10-CM

## 2013-12-19 DIAGNOSIS — O35EXX0 Maternal care for other (suspected) fetal abnormality and damage, fetal genitourinary anomalies, not applicable or unspecified: Secondary | ICD-10-CM | POA: Diagnosis present

## 2013-12-19 DIAGNOSIS — B009 Herpesviral infection, unspecified: Secondary | ICD-10-CM | POA: Diagnosis present

## 2013-12-19 DIAGNOSIS — N1330 Unspecified hydronephrosis: Secondary | ICD-10-CM | POA: Diagnosis present

## 2013-12-19 DIAGNOSIS — O99019 Anemia complicating pregnancy, unspecified trimester: Secondary | ICD-10-CM | POA: Diagnosis present

## 2013-12-19 DIAGNOSIS — O358XX0 Maternal care for other (suspected) fetal abnormality and damage, not applicable or unspecified: Secondary | ICD-10-CM | POA: Diagnosis present

## 2013-12-19 DIAGNOSIS — O34593 Maternal care for other abnormalities of gravid uterus, third trimester: Secondary | ICD-10-CM

## 2013-12-19 DIAGNOSIS — O09899 Supervision of other high risk pregnancies, unspecified trimester: Secondary | ICD-10-CM

## 2013-12-19 DIAGNOSIS — O42013 Preterm premature rupture of membranes, onset of labor within 24 hours of rupture, third trimester: Secondary | ICD-10-CM | POA: Diagnosis present

## 2013-12-19 DIAGNOSIS — O4290 Premature rupture of membranes, unspecified as to length of time between rupture and onset of labor, unspecified weeks of gestation: Secondary | ICD-10-CM | POA: Diagnosis present

## 2013-12-19 DIAGNOSIS — O9902 Anemia complicating childbirth: Secondary | ICD-10-CM | POA: Diagnosis present

## 2013-12-19 DIAGNOSIS — Q5128 Other doubling of uterus, other specified: Secondary | ICD-10-CM

## 2013-12-19 LAB — TYPE AND SCREEN
ABO/RH(D): A POS
ANTIBODY SCREEN: NEGATIVE

## 2013-12-19 LAB — CBC
HEMATOCRIT: 32.7 % — AB (ref 36.0–46.0)
HEMOGLOBIN: 10.8 g/dL — AB (ref 12.0–15.0)
MCH: 27.1 pg (ref 26.0–34.0)
MCHC: 33 g/dL (ref 30.0–36.0)
MCV: 82.2 fL (ref 78.0–100.0)
Platelets: 178 10*3/uL (ref 150–400)
RBC: 3.98 MIL/uL (ref 3.87–5.11)
RDW: 13.7 % (ref 11.5–15.5)
WBC: 13.4 10*3/uL — ABNORMAL HIGH (ref 4.0–10.5)

## 2013-12-19 LAB — GROUP B STREP BY PCR: Group B strep by PCR: NEGATIVE

## 2013-12-19 LAB — POCT FERN TEST: POCT Fern Test: POSITIVE

## 2013-12-19 LAB — RPR

## 2013-12-19 LAB — OB RESULTS CONSOLE GBS: STREP GROUP B AG: NEGATIVE

## 2013-12-19 LAB — SAMPLE TO BLOOD BANK

## 2013-12-19 MED ORDER — OXYTOCIN 40 UNITS IN LACTATED RINGERS INFUSION - SIMPLE MED
62.5000 mL/h | INTRAVENOUS | Status: DC
  Filled 2013-12-19: qty 1000

## 2013-12-19 MED ORDER — PENICILLIN G POTASSIUM 5000000 UNITS IJ SOLR
5.0000 10*6.[IU] | Freq: Once | INTRAVENOUS | Status: DC
  Filled 2013-12-19: qty 5

## 2013-12-19 MED ORDER — FENTANYL CITRATE 0.05 MG/ML IJ SOLN
100.0000 ug | INTRAMUSCULAR | Status: DC | PRN
  Administered 2013-12-19 – 2013-12-20 (×3): 100 ug via INTRAVENOUS
  Filled 2013-12-19 (×3): qty 2

## 2013-12-19 MED ORDER — OXYCODONE-ACETAMINOPHEN 5-325 MG PO TABS: 1.0000 | ORAL_TABLET | ORAL | Status: DC | PRN

## 2013-12-19 MED ORDER — LACTATED RINGERS IV SOLN
INTRAVENOUS | Status: DC
  Administered 2013-12-20 – 2013-12-21 (×2): via INTRAVENOUS

## 2013-12-19 MED ORDER — ONDANSETRON HCL 4 MG/2ML IJ SOLN
4.0000 mg | Freq: Four times a day (QID) | INTRAMUSCULAR | Status: DC | PRN
  Administered 2013-12-20: 4 mg via INTRAVENOUS
  Filled 2013-12-19: qty 2

## 2013-12-19 MED ORDER — LIDOCAINE HCL (PF) 1 % IJ SOLN: 30.0000 mL | INTRAMUSCULAR | Status: DC | PRN

## 2013-12-19 MED ORDER — ACETAMINOPHEN 325 MG PO TABS
650.0000 mg | ORAL_TABLET | ORAL | Status: DC | PRN
  Administered 2013-12-21: 650 mg via ORAL
  Filled 2013-12-19: qty 2

## 2013-12-19 MED ORDER — OXYTOCIN BOLUS FROM INFUSION: 500.0000 mL | INTRAVENOUS | Status: DC

## 2013-12-19 MED ORDER — PENICILLIN G POTASSIUM 5000000 UNITS IJ SOLR
2.5000 10*6.[IU] | INTRAVENOUS | Status: DC
  Filled 2013-12-19: qty 2.5

## 2013-12-19 MED ORDER — IBUPROFEN 600 MG PO TABS: 600.0000 mg | ORAL_TABLET | Freq: Four times a day (QID) | ORAL | Status: DC | PRN

## 2013-12-19 MED ORDER — LACTATED RINGERS IV SOLN: 500.0000 mL | INTRAVENOUS | Status: DC | PRN

## 2013-12-19 MED ORDER — CITRIC ACID-SODIUM CITRATE 334-500 MG/5ML PO SOLN
30.0000 mL | ORAL | Status: DC | PRN
  Administered 2013-12-21: 30 mL via ORAL
  Filled 2013-12-19: qty 15

## 2013-12-19 NOTE — Progress Notes (Signed)
Sheila [name] is [name] 23 y.o. G1P0 at 1529w0d by ultrasound admitted for rupture of membranes  Subjective: Patient feeling well. Contractions are irregular and mild in severity. No fevers, no persistent abdominal pain.  Objective: BP 136/88  Pulse 98  Temp(Src) 98.9 F (37.2 C) (Oral)  Resp 20  Ht 5\' 3"  (1.6 m)  Wt 61.236 kg (135 lb)  BMI 23.92 kg/m2  LMP 04/04/2013      FHT:  FHR: 135 bpm, variability: moderate,  accelerations:  Present,  decelerations:  Absent UC:   irregular, every 6 minutes SVE:   Dilation: 1.5 Effacement (%): 80 Station: -1;-2 Exam by:: hk  Labs: Lab Results  Component Value Date   WBC 13.4* 12/19/2013   HGB 10.8* 12/19/2013   HCT 32.7* 12/19/2013   MCV 82.2 12/19/2013   PLT 178 12/19/2013    Assessment / Plan: Spontaneous labor, progressing normally  Recheck once foley bulb falls out and start pit at that time.  Labor: Currently cervical ripening Preeclampsia:  no signs or symptoms of toxicity Fetal Wellbeing:  Category I Pain Control:  Considering epidural I/D:  n/[name] Anticipated MOD:  NSVD  Sheila [name], Sheila [name] 12/19/2013, 9:18 PM

## 2013-12-19 NOTE — Progress Notes (Signed)
I examined pt and agree with documentation above and resident plan of care. Walidah N Karim, CNM  

## 2013-12-19 NOTE — H&P (Signed)
Attestation of Attending Supervision of Obstetric Fellow: Evaluation and management procedures were performed by the Obstetric Fellow under my supervision and collaboration.  I have reviewed the Obstetric Fellow's note and chart, and I agree with the management and plan.  Duvid Smalls, MD, FACOG Attending Obstetrician & Gynecologist Faculty Practice, [facility]   

## 2013-12-19 NOTE — H&P (Signed)
LABOR ADMISSION HISTORY AND PHYSICAL  Sheila [name] is a 23 y.o. female G1P0 with IUP at 361w0d by LMP c/w with 6w sono presenting PROM 2300 12/18/13.  She reports +FMs, No LOF, no VB, no blurry vision, headaches or peripheral edema, and RUQ pain. She desires an epidural for labor pain control. She plans on breast and bottle feeding. She request LARC for birth control. Epidural requests  Dating: By LMP c/w early sono --->  Estimated Date of Delivery: 01/16/14   Prenatal History/Complications:  Past Medical History: Past Medical History  Diagnosis Date  . Medical history non-contributory   . HSV-2 infection   . Nosebleed 09/06/2013  . Round ligament pain 09/06/2013    Past Surgical History: Past Surgical History  Procedure Laterality Date  . Bilateral breast implants  12/2011    . Tonsillectomy      Obstetrical History: OB History   Grav Para Term Preterm Abortions TAB SAB Ect Mult Living   1              2015 - current  Social History: History   Social History  . Marital Status: Single    Spouse Name: N/A    Number of Children: N/A  . Years of Education: N/A   Social History Main Topics  . Smoking status: Never Smoker   . Smokeless tobacco: Never Used  . Alcohol Use: No     Comment: not now  . Drug Use: No  . Sexual Activity: Not Currently    Birth Control/ Protection: None   Other Topics Concern  . None   Social History Narrative  . None    Family History: Family History  Problem Relation Age of Onset  . Cancer Maternal Grandmother   . Cancer Maternal Grandfather     Allergies: No Known Allergies  Prescriptions prior to admission  Medication Sig Dispense Refill  . Iron-FA-B Cmp-C-Biot-Probiotic (FUSION PLUS) CAPS Take 1 capsule by mouth See admin instructions. 1 time daily between meals  30 capsule  6  . Prenat-FeCbn-FeAspGl-FA-Omega (OB COMPLETE PETITE) 35-5-1-200 MG CAPS Take 1 tablet by mouth daily.  30 capsule  11  . progesterone (PROMETRIUM)  200 MG capsule Place one capsule vaginally at bedtime  30 capsule  3  . valACYclovir (VALTREX) 1000 MG tablet Take 1 tablet (1,000 mg total) by mouth daily.  30 tablet  3     Review of Systems   All systems reviewed and negative except as stated in HPI  Blood pressure 127/89, pulse 107, temperature 98.3 F (36.8 C), temperature source Oral, resp. rate 18, height 5\' 3"  (1.6 m), weight 61.236 kg (135 lb), last menstrual period 04/04/2013. General appearance: alert Lungs: clear to auscultation bilaterally Heart: regular rate and rhythm Abdomen: soft, non-tender; bowel sounds normal Pelvic: no lesions, 1/70/-2 Extremities: Homans sign is negative, no sign of DVT DTR's normal Presentation: cephalic Fetal monitoringBaseline: 145 bpm Uterine activity q2-704min  Dilation: 1.5 Effacement (%): 70 Station: -1 Exam by:: Dorrene GermanJ. Lowe RN   Prenatal labs: ABO, Rh: --/--/A POS, A POS (05/20 16100635) Antibody: NEG (05/20 0635) Rubella:   RPR: NON REAC (05/27 1155)  HBsAg: NEGATIVE (01/06 1608)  HIV: NONREACTIVE (05/27 1155)  GBS: Negative (07/21 0000)  1 hr Glucola 112 Genetic screening  neg Anatomy US: Female 'Jagger', bilat hydronephrosis, recheck @ 28wks, 666w6d, no ureteral dilatation   Prenatal Transfer Tool  Maternal Diabetes: No Genetic Screening: Normal Maternal Ultrasounds/Referrals: Abnormal:  Findings:   Other: Female 'Jagger', bilat hydronephrosis Fetal  Ultrasounds or other Referrals:  None Maternal Substance Abuse:  No Significant Maternal Medications:  Meds include: Other:  Valtrex, prometrium Significant Maternal Lab Results: None     Results for orders placed during the hospital encounter of 12/19/13 (from the past 24 hour(s))  OB RESULTS CONSOLE GBS   Collection Time    12/19/13 12:00 AM      Result Value Ref Range   GBS Negative    POCT FERN TEST   Collection Time    12/19/13 10:05 AM      Result Value Ref Range   POCT Fern Test Positive = ruptured amniotic membanes     GROUP B STREP BY PCR   Collection Time    12/19/13 10:10 AM      Result Value Ref Range   Group B strep by PCR NEGATIVE  NEGATIVE  CBC   Collection Time    12/19/13 10:50 AM      Result Value Ref Range   WBC 13.4 (*) 4.0 - 10.5 K/uL   RBC 3.98  3.87 - 5.11 MIL/uL   Hemoglobin 10.8 (*) 12.0 - 15.0 g/dL   HCT 16.1 (*) 09.6 - 04.5 %   MCV 82.2  78.0 - 100.0 fL   MCH 27.1  26.0 - 34.0 pg   MCHC 33.0  30.0 - 36.0 g/dL   RDW 40.9  81.1 - 91.4 %   Platelets 178  150 - 400 K/uL    Patient Active Problem List   Diagnosis Date Noted  . Preterm premature rupture of membranes (PPROM) with onset of labor within 24 hours of rupture in third trimester, antepartum 12/19/2013  . Supervision of other high-risk pregnancy(V23.89) 11/27/2013  . Anemia affecting pregnancy 11/13/2013  . Short cervix affecting pregnancy 10/27/2013  . Preterm labor 10/18/2013  . Premature rupture of membranes in pregnancy, antepartum 10/17/2013  . Nosebleed 09/06/2013  . Fetal hydronephrosis in pregnancy, antepartum condition 08/29/2013  . Supervision of high-risk pregnancy 07/04/2013  . 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 HSV-2 infection 04/06/2012    Assessment: Sheila [name] is a 23 y.o. G1P0 at [redacted]w[redacted]d here for PROM 23 y.o. @GP1 @ at [redacted]w[redacted]d by early sono cw with LMP, Cat 1 strip  #Labor:augment with balloon, pitocin if/when needed #Pain: Prn fentanyl, epidural if requested #FWB: cat1 strip #ID:  GBS neg #MOF: breast #MOC:LARC #Circ:  At  Outside clinic  Zenda Herskowitz ROCIO 12/19/2013, 12:17 PM

## 2013-12-19 NOTE — MAU Note (Signed)
srom at 2300 yesterday, still leaking clear fluid. No bleeding.  Mild cramps.

## 2013-12-20 ENCOUNTER — Inpatient Hospital Stay (HOSPITAL_COMMUNITY): Payer: Medicaid Other | Admitting: Anesthesiology

## 2013-12-20 ENCOUNTER — Encounter (HOSPITAL_COMMUNITY): Payer: Medicaid Other | Admitting: Anesthesiology

## 2013-12-20 MED ORDER — OXYTOCIN 40 UNITS IN LACTATED RINGERS INFUSION - SIMPLE MED
1.0000 m[IU]/min | INTRAVENOUS | Status: DC
Start: 2013-12-20 — End: 2013-12-20
  Administered 2013-12-20: 2 m[IU]/min via INTRAVENOUS

## 2013-12-20 MED ORDER — EPHEDRINE 5 MG/ML INJ: 10.0000 mg | INTRAVENOUS | Status: DC | PRN

## 2013-12-20 MED ORDER — DIPHENHYDRAMINE HCL 50 MG/ML IJ SOLN
12.5000 mg | INTRAMUSCULAR | Status: DC | PRN
  Administered 2013-12-20 (×2): 12.5 mg via INTRAVENOUS
  Filled 2013-12-20 (×2): qty 1

## 2013-12-20 MED ORDER — PROMETHAZINE HCL 25 MG PO TABS: 12.5000 mg | ORAL_TABLET | Freq: Four times a day (QID) | ORAL | Status: DC | PRN

## 2013-12-20 MED ORDER — LIDOCAINE HCL (PF) 1 % IJ SOLN
INTRAMUSCULAR | Status: DC | PRN
  Administered 2013-12-20: 8 mL

## 2013-12-20 MED ORDER — OXYTOCIN 40 UNITS IN LACTATED RINGERS INFUSION - SIMPLE MED: 1.0000 m[IU]/min | INTRAVENOUS | Status: DC

## 2013-12-20 MED ORDER — LACTATED RINGERS IV SOLN: 500.0000 mL | Freq: Once | INTRAVENOUS | Status: DC

## 2013-12-20 MED ORDER — BUTORPHANOL TARTRATE 1 MG/ML IJ SOLN
1.0000 mg | INTRAMUSCULAR | Status: DC | PRN
  Administered 2013-12-20: 1 mg via INTRAVENOUS
  Filled 2013-12-20: qty 1

## 2013-12-20 MED ORDER — FENTANYL 2.5 MCG/ML BUPIVACAINE 1/10 % EPIDURAL INFUSION (WH - ANES)
14.0000 mL/h | INTRAMUSCULAR | Status: DC | PRN
  Administered 2013-12-20 – 2013-12-21 (×3): 14 mL/h via EPIDURAL
  Filled 2013-12-20 (×3): qty 125

## 2013-12-20 MED ORDER — PROMETHAZINE HCL 25 MG/ML IJ SOLN
12.5000 mg | Freq: Four times a day (QID) | INTRAMUSCULAR | Status: DC | PRN
  Administered 2013-12-20: 12.5 mg via INTRAVENOUS
  Filled 2013-12-20: qty 1

## 2013-12-20 MED ORDER — TERBUTALINE SULFATE 1 MG/ML IJ SOLN: 0.2500 mg | Freq: Once | INTRAMUSCULAR | Status: DC | PRN

## 2013-12-20 MED ORDER — PHENYLEPHRINE 40 MCG/ML (10ML) SYRINGE FOR IV PUSH (FOR BLOOD PRESSURE SUPPORT)
80.0000 ug | PREFILLED_SYRINGE | INTRAVENOUS | Status: DC | PRN
Start: 2013-12-20 — End: 2013-12-21
  Filled 2013-12-20: qty 10

## 2013-12-20 MED ORDER — TERBUTALINE SULFATE 1 MG/ML IJ SOLN: 0.2500 mg | Freq: Once | INTRAMUSCULAR | Status: AC | PRN

## 2013-12-20 MED ORDER — PHENYLEPHRINE 40 MCG/ML (10ML) SYRINGE FOR IV PUSH (FOR BLOOD PRESSURE SUPPORT): 80.0000 ug | PREFILLED_SYRINGE | INTRAVENOUS | Status: DC | PRN

## 2013-12-20 MED ORDER — FENTANYL 2.5 MCG/ML BUPIVACAINE 1/10 % EPIDURAL INFUSION (WH - ANES): 14.0000 mL/h | INTRAMUSCULAR | Status: DC | PRN

## 2013-12-20 NOTE — Progress Notes (Signed)
Sheila [name] is a 23 y.o. G1P0 at 7039w1d by ultrasound admitted for PPROM, undergoing induction of labor  Subjective: Patient feeling well.  No pain Objective: BP 122/82  Pulse 91  Temp(Src) 98.9 F (37.2 C) (Oral)  Resp 18  Ht 5\' 3"  (1.6 m)  Wt 135 lb (61.236 kg)  BMI 23.92 kg/m2  LMP 04/04/2013     FHT:  FHR: 135 bpm, variability: moderate,  accelerations:  Present,  decelerations:  Absent UC:  every 1-3 minutes SVE:   Dilation: 4 Effacement (%): 80 Station: -2 Exam by:: dr. Macon Largeanyanwu  Labs: Lab Results  Component Value Date   WBC 13.4* 12/19/2013   HGB 10.8* 12/19/2013   HCT 32.7* 12/19/2013   MCV 82.2 12/19/2013   PLT 178 12/19/2013    Assessment / Plan: Induction of labor after PPROM, progressing normally  Labor: Continue pitocin per protocol Fetal Wellbeing:  Category I Pain Control:  Epidural Anticipated MOD:  NSVD   Tereso NewcomerUgonna A Boe Deans, MD 12/20/2013, 12:05 PM

## 2013-12-20 NOTE — Progress Notes (Signed)
Sheila [name] is a 23 y.o. G1P0 at 1824w1d.  Subjective: Comfortable w/ epidural.  Objective: BP 114/75  Pulse 94  Temp(Src) 98.2 F (36.8 C) (Oral)  Resp 18  Ht 5\' 3"  (1.6 m)  Wt 61.236 kg (135 lb)  BMI 23.92 kg/m2  SpO2 98%  LMP 04/04/2013      FHT:  FHR: 135 bpm, variability: moderate,  accelerations:  Present,  decelerations:  Absent UC:   regular, every 2-4 minutes. MVUs 80-155 SVE:   Dilation: 4 Effacement (%): 70 Station: -2 +Caput Cervix dilated to vaginal septum on maternal right and 1.5 cm remaining on left side of cervix. Exam by:: Sheila KinsmanVirginia Kerrilyn Azbill, CNM  Labs: Lab Results  Component Value Date   WBC 13.4* 12/19/2013   HGB 10.8* 12/19/2013   HCT 32.7* 12/19/2013   MCV 82.2 12/19/2013   PLT 178 12/19/2013    Assessment / Plan: Protracted latent phase  Labor: Protracted latent phase. Continue increasing pitocin. Preeclampsia:  NA Fetal Wellbeing:  Category I Pain Control:  Epidural I/D:  n/a Anticipated MOD:  Concerned about lack of labor progress and concerned about unforgiving vaginal septum. Will have Dr. Despina HiddenEure come assess.   Sheila KinsmanSMITH, Sheila [name] 12/20/2013, 9:01 PM

## 2013-12-20 NOTE — Anesthesia Procedure Notes (Signed)
Epidural Patient location during procedure: OB  Preanesthetic Checklist Completed: patient identified, site marked, surgical consent, pre-op evaluation, timeout performed, IV checked, risks and benefits discussed and monitors and equipment checked  Epidural Patient position: sitting Prep: site prepped and draped and DuraPrep Patient monitoring: continuous pulse ox and blood pressure Approach: midline Injection technique: LOR air  Needle:  Needle type: Tuohy  Needle gauge: 17 G Needle length: 9 cm and 9 Needle insertion depth: 4 cm Catheter type: closed end flexible Catheter size: 19 Gauge Catheter at skin depth: 10 cm Test dose: negative  Assessment Events: blood not aspirated, injection not painful, no injection resistance, negative IV test and no paresthesia  Additional Notes Dosing of Epidural:  1st dose, through catheter ............................................. Xylocaine 30 mg  2nd dose, through catheter, after waiting 3 minutes........Xylocaine 30 mg   ( 1% Xylo charted as a single dose in Epic Meds for ease of charting; actual dosing was fractionated as above, for saftey's sake)  As each dose occurred, patient was free of IV sx; and patient exhibited no evidence of SA injection.  Patient is more comfortable after epidural dosed. Please see RN's note for documentation of vital signs,and FHR which are stable.  Patient reminded not to try to ambulate with numb legs, and that an RN must be present the 1st time she attempts to get up.    

## 2013-12-20 NOTE — Progress Notes (Signed)
Sheila [name] is a 23 y.o. G1P0 at 2677w1d by LMP admitted for rupture of membranes  Subjective: Patient has some moderate discomfort with contractions. No other complaints.  Objective: BP 127/82  Pulse 100  Temp(Src) 98.4 F (36.9 C) (Oral)  Resp 18  Ht 5\' 3"  (1.6 m)  Wt 61.236 kg (135 lb)  BMI 23.92 kg/m2  LMP 04/04/2013      FHT:  FHR: 135 bpm, variability: moderate,  accelerations:  Present,  decelerations:  Absent UC:   regular, every 4 minutes SVE:   Dilation: 1.5 Effacement (%): 80 Station: -1;-2 Exam by:: hk  Labs: Lab Results  Component Value Date   WBC 13.4* 12/19/2013   HGB 10.8* 12/19/2013   HCT 32.7* 12/19/2013   MCV 82.2 12/19/2013   PLT 178 12/19/2013    Assessment / Plan: On physical exam, foley bulb was found to be leading to a most posterior cervix at the 7 oclock position. Her gravid cervix appears to be localized at the 3 oclock position, separated by a septum from the larger part of the vagina. Head with anterior fontanelle was palpable beyond this cervix. Foley bulb was deflated and inserted into the cervix at 3 oclock and reinflated with 60mL saline. This cervix was 2.5cm dilated, soft, 50% effaced at -2 station.   No signs of fever or active infection.   Markus JarvisStephens, [name] A 12/20/2013, 12:33 AM

## 2013-12-20 NOTE — Progress Notes (Signed)
At 1606, pt c/o shortness of breath and cough.  O2 sats 92% and O2 initiated.  Benadryl called to room and Midwife requested at bedside.  Fhr with prolonged decel.  Pt position changed, and pitocin discontinued.  Midwife and attending to bedside.

## 2013-12-20 NOTE — Progress Notes (Signed)
I examined pt and agree with documentation above and resident plan of care. Jedd Schulenburg N Karim, CNM  

## 2013-12-20 NOTE — Anesthesia Preprocedure Evaluation (Signed)

## 2013-12-20 NOTE — Progress Notes (Signed)
Patient ID: Sheila [name], female   DOB: 07/06/1990, 23 y.o.   MRN: [id number] Sheila [name] is a 23 y.o. G1P0 at 1754w1d by ultrasound admitted for PPROM, undergoing induction of labor  Subjective: Patient feeling well. After pt had IUPC placed, pt complained of coughing and SOB which quickly resolved w/ supplemental O2.  Objective: BP 132/87  Pulse 91  Temp(Src) 98.7 F (37.1 C) (Oral)  Resp 18  Ht 5\' 3"  (1.6 m)  Wt 135 lb (61.236 kg)  BMI 23.92 kg/m2  LMP 04/04/2013     FHT:  FHR: 135 bpm, variability: moderate,  accelerations:  Present,  decelerations:  Absent UC:  every 1-3 minutes SVE:   Dilation: 4 Effacement (%): 70 Station: -2 Exam by:: Marca Gadsby  Labs: Lab Results  Component Value Date   WBC 13.4* 12/19/2013   HGB 10.8* 12/19/2013   HCT 32.7* 12/19/2013   MCV 82.2 12/19/2013   PLT 178 12/19/2013    Assessment / Plan: Induction of labor after PPROM, progressing normally  Labor: D/c pitocin given decel. Will restart slowly per protocol; Place IUPC Fetal Wellbeing:  Category II, One prolonged decel to 90s which last for ~ 3 minutes which resolved with LL decub and supplemental O2, pitocin turned off Pain Control:  Epidural Anticipated MOD:  NSVD   Ethelda Chickaroline Cejay Cambre, MD 12/20/2013, 4:09 PM

## 2013-12-20 NOTE — Progress Notes (Signed)
Patient ID: Vonzella Nipplehynna L Bryk, female   DOB: 06/30/1990, 23 y.o.   MRN: [id number] Labor Progress Note  ASSESSMENT:   Vonzella Nipplehynna L Sahm 23 y.o. G1P0 at 2331w1d in induced labor 2/2 PPROM   PLAN:  1) Labor curve reviewed.       Progress: Latent             Plan: Continue Pitocin  2) Fetal heart tracing reviewed.    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 Able to rest.   OBJECTIVE:  Vital Signs: Patient Vitals for the past 2 hrs:  BP Temp Temp src Pulse Resp  12/20/13 1059 - 98.9 F (37.2 C) Oral - -  12/20/13 1031 129/83 mmHg - - 95 -  12/20/13 1001 130/82 mmHg - - 96 -  12/20/13 0956 124/88 mmHg - - 96 18  12/20/13 0951 126/87 mmHg - - 102 18  12/20/13 0946 133/83 mmHg - - 91 18  12/20/13 0941 127/65 mmHg - - 95 -  12/20/13 0937 129/84 mmHg - - 99 -  12/20/13 0936 138/80 mmHg - - 110 -  12/20/13 0933 153/101 mmHg - - 118 -  12/20/13 0931 146/95 mmHg - - 113 20  12/20/13 0901 131/86 mmHg 98 F (36.7 C) Oral 106 -   SVE: Dilation: 4, Effacement (%): 80, Station: -1  FHR Monitoring Baseline Rate (A): 140 bpm   Accelerations: 15 x 15 Contraction Frequency (min): 1.5-3

## 2013-12-21 ENCOUNTER — Encounter (HOSPITAL_COMMUNITY): Payer: Self-pay

## 2013-12-21 ENCOUNTER — Encounter (HOSPITAL_COMMUNITY)
Admission: AD | Disposition: A | Discharge status: Home / Self Care | Payer: Self-pay | Source: Ambulatory Visit | Attending: Obstetrics & Gynecology

## 2013-12-21 DIAGNOSIS — Q5128 Other doubling of uterus, other specified: Secondary | ICD-10-CM

## 2013-12-21 DIAGNOSIS — O411090 Infection of amniotic sac and membranes, unspecified, unspecified trimester, not applicable or unspecified: Secondary | ICD-10-CM

## 2013-12-21 DIAGNOSIS — Z98891 History of uterine scar from previous surgery: Secondary | ICD-10-CM

## 2013-12-21 DIAGNOSIS — O4290 Premature rupture of membranes, unspecified as to length of time between rupture and onset of labor, unspecified weeks of gestation: Secondary | ICD-10-CM

## 2013-12-21 DIAGNOSIS — O328XX0 Maternal care for other malpresentation of fetus, not applicable or unspecified: Secondary | ICD-10-CM

## 2013-12-21 LAB — COMPREHENSIVE METABOLIC PANEL
ALBUMIN: 2.5 g/dL — AB (ref 3.5–5.2)
ALBUMIN: 1.8 g/dL — AB (ref 3.5–5.2)
ALK PHOS: 151 U/L — AB (ref 39–117)
ALT: 13 U/L (ref 0–35)
ALT: 11 U/L (ref 0–35)
AST: 23 U/L (ref 0–37)
AST: 26 U/L (ref 0–37)
Alkaline Phosphatase: 230 U/L — ABNORMAL HIGH (ref 39–117)
Anion gap: 16 — ABNORMAL HIGH (ref 5–15)
Anion gap: 12 (ref 5–15)
BILIRUBIN TOTAL: 1 mg/dL (ref 0.3–1.2)
BUN: 11 mg/dL (ref 6–23)
BUN: 11 mg/dL (ref 6–23)
CHLORIDE: 97 meq/L (ref 96–112)
CO2: 20 mEq/L (ref 19–32)
CO2: 25 mEq/L (ref 19–32)
Calcium: 8.3 mg/dL — ABNORMAL LOW (ref 8.4–10.5)
Calcium: 7.8 mg/dL — ABNORMAL LOW (ref 8.4–10.5)
Chloride: 98 mEq/L (ref 96–112)
Creatinine, Ser: 1.86 mg/dL — ABNORMAL HIGH (ref 0.50–1.10)
Creatinine, Ser: 1.9 mg/dL — ABNORMAL HIGH (ref 0.50–1.10)
GFR calc Af Amer: 43 mL/min — ABNORMAL LOW (ref >90)
GFR calc non Af Amer: 38 mL/min — ABNORMAL LOW (ref >90)
GFR calc non Af Amer: 37 mL/min — ABNORMAL LOW (ref >90)
GFR, EST AFRICAN AMERICAN: 44 mL/min — AB (ref >90)
GLUCOSE: 93 mg/dL (ref 70–99)
Glucose, Bld: 151 mg/dL — ABNORMAL HIGH (ref 70–99)
POTASSIUM: 4.3 meq/L (ref 3.7–5.3)
Potassium: 3.9 mEq/L (ref 3.7–5.3)
Sodium: 134 mEq/L — ABNORMAL LOW (ref 137–147)
Sodium: 134 mEq/L — ABNORMAL LOW (ref 137–147)
Total Bilirubin: 1.4 mg/dL — ABNORMAL HIGH (ref 0.3–1.2)
Total Protein: 6.1 g/dL (ref 6.0–8.3)
Total Protein: 5 g/dL — ABNORMAL LOW (ref 6.0–8.3)

## 2013-12-21 LAB — CBC
HEMATOCRIT: 27.3 % — AB (ref 36.0–46.0)
HEMOGLOBIN: 8.9 g/dL — AB (ref 12.0–15.0)
MCH: 27.2 pg (ref 26.0–34.0)
MCHC: 32.6 g/dL (ref 30.0–36.0)
MCV: 83.5 fL (ref 78.0–100.0)
Platelets: 164 10*3/uL (ref 150–400)
RBC: 3.27 MIL/uL — ABNORMAL LOW (ref 3.87–5.11)
RDW: 13.9 % (ref 11.5–15.5)
WBC: 22.6 10*3/uL — ABNORMAL HIGH (ref 4.0–10.5)

## 2013-12-21 LAB — CBC WITH DIFFERENTIAL/PLATELET
Basophils Absolute: 0 10*3/uL (ref 0.0–0.1)
Basophils Relative: 0 % (ref 0–1)
EOS ABS: 0.1 10*3/uL (ref 0.0–0.7)
Eosinophils Relative: 0 % (ref 0–5)
HCT: 34.2 % — ABNORMAL LOW (ref 36.0–46.0)
HEMOGLOBIN: 11.4 g/dL — AB (ref 12.0–15.0)
LYMPHS ABS: 2 10*3/uL (ref 0.7–4.0)
Lymphocytes Relative: 9 % — ABNORMAL LOW (ref 12–46)
MCH: 28 pg (ref 26.0–34.0)
MCHC: 33.3 g/dL (ref 30.0–36.0)
MCV: 84 fL (ref 78.0–100.0)
MONOS PCT: 6 % (ref 3–12)
Monocytes Absolute: 1.3 10*3/uL — ABNORMAL HIGH (ref 0.1–1.0)
NEUTROS ABS: 18.4 10*3/uL — AB (ref 1.7–7.7)
NEUTROS PCT: 85 % — AB (ref 43–77)
Platelets: 188 10*3/uL (ref 150–400)
RBC: 4.07 MIL/uL (ref 3.87–5.11)
RDW: 14 % (ref 11.5–15.5)
WBC: 21.8 10*3/uL — AB (ref 4.0–10.5)

## 2013-12-21 LAB — FIBRINOGEN: FIBRINOGEN: 652 mg/dL — AB (ref 204–475)

## 2013-12-21 LAB — PROTIME-INR
INR: 1 (ref 0.00–1.49)
Prothrombin Time: 13.2 seconds (ref 11.6–15.2)

## 2013-12-21 LAB — APTT: APTT: 28 s (ref 24–37)

## 2013-12-21 SURGERY — Surgical Case
Anesthesia: Epidural

## 2013-12-21 MED ORDER — FUROSEMIDE 10 MG/ML IJ SOLN
20.0000 mg | Freq: Three times a day (TID) | INTRAMUSCULAR | Status: DC
  Administered 2013-12-21 – 2013-12-22 (×3): 20 mg via INTRAVENOUS
  Filled 2013-12-21 (×3): qty 2

## 2013-12-21 MED ORDER — NALOXONE HCL 1 MG/ML IJ SOLN: 1.0000 ug/kg/h | INTRAVENOUS | Status: DC | PRN

## 2013-12-21 MED ORDER — FUROSEMIDE 10 MG/ML IJ SOLN
INTRAMUSCULAR | Status: DC | PRN
  Administered 2013-12-21: 20 mg via INTRAMUSCULAR

## 2013-12-21 MED ORDER — KETOROLAC TROMETHAMINE 30 MG/ML IJ SOLN
INTRAMUSCULAR | Status: AC
  Filled 2013-12-21: qty 1

## 2013-12-21 MED ORDER — SODIUM BICARBONATE 8.4 % IV SOLN
INTRAVENOUS | Status: DC | PRN
  Administered 2013-12-21: 2 mL via EPIDURAL
  Administered 2013-12-21: 8 mL via EPIDURAL

## 2013-12-21 MED ORDER — IBUPROFEN 600 MG PO TABS
600.0000 mg | ORAL_TABLET | Freq: Four times a day (QID) | ORAL | Status: DC
  Administered 2013-12-21 – 2013-12-23 (×8): 600 mg via ORAL
  Filled 2013-12-21 (×9): qty 1

## 2013-12-21 MED ORDER — OXYTOCIN 40 UNITS IN LACTATED RINGERS INFUSION - SIMPLE MED: 62.5000 mL/h | INTRAVENOUS | Status: AC

## 2013-12-21 MED ORDER — PIPERACILLIN-TAZOBACTAM 3.375 G IVPB
3.3750 g | Freq: Three times a day (TID) | INTRAVENOUS | Status: DC
  Administered 2013-12-21 – 2013-12-22 (×3): 3.375 g via INTRAVENOUS
  Filled 2013-12-21 (×6): qty 50

## 2013-12-21 MED ORDER — FENTANYL CITRATE 0.05 MG/ML IJ SOLN
INTRAMUSCULAR | Status: AC
  Filled 2013-12-21: qty 2

## 2013-12-21 MED ORDER — MENTHOL 3 MG MT LOZG: 1.0000 | LOZENGE | OROMUCOSAL | Status: DC | PRN

## 2013-12-21 MED ORDER — PIPERACILLIN-TAZOBACTAM 3.375 G IVPB: 3.3750 g | Freq: Four times a day (QID) | INTRAVENOUS | Status: DC

## 2013-12-21 MED ORDER — ONDANSETRON HCL 4 MG/2ML IJ SOLN: 4.0000 mg | Freq: Three times a day (TID) | INTRAMUSCULAR | Status: DC | PRN

## 2013-12-21 MED ORDER — LACTATED RINGERS IV SOLN
INTRAVENOUS | Status: DC | PRN
Start: 2013-12-21 — End: 2013-12-21
  Administered 2013-12-21: 08:00:00 via INTRAVENOUS

## 2013-12-21 MED ORDER — TETANUS-DIPHTH-ACELL PERTUSSIS 5-2.5-18.5 LF-MCG/0.5 IM SUSP: 0.5000 mL | Freq: Once | INTRAMUSCULAR | Status: DC

## 2013-12-21 MED ORDER — ACETAMINOPHEN 500 MG PO TABS
1000.0000 mg | ORAL_TABLET | Freq: Four times a day (QID) | ORAL | Status: DC
  Administered 2013-12-21 (×2): 1000 mg via ORAL
  Filled 2013-12-21 (×3): qty 2

## 2013-12-21 MED ORDER — OXYTOCIN 10 UNIT/ML IJ SOLN
INTRAMUSCULAR | Status: AC
  Filled 2013-12-21: qty 4

## 2013-12-21 MED ORDER — SODIUM CHLORIDE 0.9 % IJ SOLN: 3.0000 mL | INTRAMUSCULAR | Status: DC | PRN

## 2013-12-21 MED ORDER — DIPHENHYDRAMINE HCL 50 MG/ML IJ SOLN: 25.0000 mg | INTRAMUSCULAR | Status: DC | PRN

## 2013-12-21 MED ORDER — SIMETHICONE 80 MG PO CHEW
80.0000 mg | CHEWABLE_TABLET | ORAL | Status: DC
  Administered 2013-12-21 – 2013-12-24 (×4): 80 mg via ORAL
  Filled 2013-12-21 (×3): qty 1

## 2013-12-21 MED ORDER — DIPHENHYDRAMINE HCL 50 MG/ML IJ SOLN: 12.5000 mg | INTRAMUSCULAR | Status: DC | PRN

## 2013-12-21 MED ORDER — ONDANSETRON HCL 4 MG/2ML IJ SOLN
INTRAMUSCULAR | Status: AC
  Filled 2013-12-21: qty 4

## 2013-12-21 MED ORDER — MIDAZOLAM HCL 2 MG/2ML IJ SOLN: 0.5000 mg | Freq: Once | INTRAMUSCULAR | Status: DC | PRN

## 2013-12-21 MED ORDER — SODIUM CHLORIDE 0.9 % IR SOLN
Status: DC | PRN
  Administered 2013-12-21: 1000 mL

## 2013-12-21 MED ORDER — METOCLOPRAMIDE HCL 5 MG/ML IJ SOLN: 10.0000 mg | Freq: Three times a day (TID) | INTRAMUSCULAR | Status: DC | PRN

## 2013-12-21 MED ORDER — DIBUCAINE 1 % RE OINT: 1.0000 "application " | TOPICAL_OINTMENT | RECTAL | Status: DC | PRN

## 2013-12-21 MED ORDER — KETOROLAC TROMETHAMINE 30 MG/ML IJ SOLN: 30.0000 mg | Freq: Four times a day (QID) | INTRAMUSCULAR | Status: AC | PRN

## 2013-12-21 MED ORDER — SIMETHICONE 80 MG PO CHEW: 80.0000 mg | CHEWABLE_TABLET | ORAL | Status: DC | PRN

## 2013-12-21 MED ORDER — FUROSEMIDE 10 MG/ML IJ SOLN
INTRAMUSCULAR | Status: AC
  Filled 2013-12-21: qty 2

## 2013-12-21 MED ORDER — PHENYLEPHRINE 40 MCG/ML (10ML) SYRINGE FOR IV PUSH (FOR BLOOD PRESSURE SUPPORT)
PREFILLED_SYRINGE | INTRAVENOUS | Status: AC
  Filled 2013-12-21: qty 5

## 2013-12-21 MED ORDER — MEPERIDINE HCL 25 MG/ML IJ SOLN: 6.2500 mg | INTRAMUSCULAR | Status: DC | PRN

## 2013-12-21 MED ORDER — FENTANYL CITRATE 0.05 MG/ML IJ SOLN
INTRAMUSCULAR | Status: DC | PRN
  Administered 2013-12-21 (×2): 50 ug via INTRAVENOUS

## 2013-12-21 MED ORDER — SCOPOLAMINE 1 MG/3DAYS TD PT72
MEDICATED_PATCH | TRANSDERMAL | Status: AC
  Filled 2013-12-21: qty 1

## 2013-12-21 MED ORDER — PHENYLEPHRINE 8 MG IN D5W 100 ML (0.08MG/ML) PREMIX OPTIME
INJECTION | INTRAVENOUS | Status: DC | PRN
  Administered 2013-12-21: 40 ug/min via INTRAVENOUS

## 2013-12-21 MED ORDER — OXYCODONE-ACETAMINOPHEN 5-325 MG PO TABS
1.0000 | ORAL_TABLET | ORAL | Status: DC | PRN
  Administered 2013-12-21 – 2013-12-22 (×3): 1 via ORAL
  Administered 2013-12-22 (×2): 2 via ORAL
  Administered 2013-12-22 – 2013-12-23 (×3): 1 via ORAL
  Administered 2013-12-23 – 2013-12-24 (×6): 2 via ORAL
  Administered 2013-12-25 (×3): 1 via ORAL
  Administered 2013-12-25: 2 via ORAL
  Filled 2013-12-21 (×2): qty 1
  Filled 2013-12-21 (×2): qty 2
  Filled 2013-12-21: qty 1
  Filled 2013-12-21: qty 2
  Filled 2013-12-21: qty 1
  Filled 2013-12-21: qty 2
  Filled 2013-12-21 (×2): qty 1
  Filled 2013-12-21 (×2): qty 2
  Filled 2013-12-21 (×2): qty 1
  Filled 2013-12-21 (×3): qty 2

## 2013-12-21 MED ORDER — WITCH HAZEL-GLYCERIN EX PADS: 1.0000 "application " | MEDICATED_PAD | CUTANEOUS | Status: DC | PRN

## 2013-12-21 MED ORDER — PROMETHAZINE HCL 25 MG/ML IJ SOLN
6.2500 mg | INTRAMUSCULAR | Status: DC | PRN
Start: 2013-12-21 — End: 2013-12-21

## 2013-12-21 MED ORDER — KETOROLAC TROMETHAMINE 30 MG/ML IJ SOLN
30.0000 mg | Freq: Four times a day (QID) | INTRAMUSCULAR | Status: AC | PRN
  Administered 2013-12-21: 30 mg via INTRAMUSCULAR

## 2013-12-21 MED ORDER — DIPHENHYDRAMINE HCL 25 MG PO CAPS: 25.0000 mg | ORAL_CAPSULE | ORAL | Status: DC | PRN

## 2013-12-21 MED ORDER — MEPERIDINE HCL 25 MG/ML IJ SOLN
INTRAMUSCULAR | Status: AC
  Filled 2013-12-21: qty 1

## 2013-12-21 MED ORDER — FENTANYL CITRATE 0.05 MG/ML IJ SOLN
25.0000 ug | INTRAMUSCULAR | Status: DC | PRN
  Administered 2013-12-21 (×2): 50 ug via INTRAVENOUS

## 2013-12-21 MED ORDER — OXYTOCIN 10 UNIT/ML IJ SOLN
40.0000 [IU] | INTRAVENOUS | Status: DC | PRN
  Administered 2013-12-21: 40 [IU] via INTRAVENOUS

## 2013-12-21 MED ORDER — MORPHINE SULFATE (PF) 0.5 MG/ML IJ SOLN
INTRAMUSCULAR | Status: DC | PRN
  Administered 2013-12-21: 1 mg via EPIDURAL

## 2013-12-21 MED ORDER — BUPIVACAINE LIPOSOME 1.3 % IJ SUSP
20.0000 mL | Freq: Once | INTRAMUSCULAR | Status: AC
  Administered 2013-12-21: 20 mL
  Filled 2013-12-21: qty 20

## 2013-12-21 MED ORDER — ONDANSETRON HCL 4 MG PO TABS: 4.0000 mg | ORAL_TABLET | ORAL | Status: DC | PRN

## 2013-12-21 MED ORDER — DIPHENHYDRAMINE HCL 25 MG PO CAPS: 25.0000 mg | ORAL_CAPSULE | Freq: Four times a day (QID) | ORAL | Status: DC | PRN

## 2013-12-21 MED ORDER — MEPERIDINE HCL 25 MG/ML IJ SOLN
INTRAMUSCULAR | Status: DC | PRN
  Administered 2013-12-21 (×2): 12.5 mg via INTRAVENOUS

## 2013-12-21 MED ORDER — NALBUPHINE HCL 10 MG/ML IJ SOLN
5.0000 mg | INTRAMUSCULAR | Status: DC | PRN
Start: 2013-12-21 — End: 2013-12-25

## 2013-12-21 MED ORDER — LANOLIN HYDROUS EX OINT: 1.0000 "application " | TOPICAL_OINTMENT | CUTANEOUS | Status: DC | PRN

## 2013-12-21 MED ORDER — GENTAMICIN SULFATE 40 MG/ML IJ SOLN
150.0000 mg | Freq: Once | INTRAMUSCULAR | Status: AC
  Administered 2013-12-21: 150 mg via INTRAVENOUS
  Filled 2013-12-21: qty 3.75

## 2013-12-21 MED ORDER — PHENYLEPHRINE 8 MG IN D5W 100 ML (0.08MG/ML) PREMIX OPTIME
INJECTION | INTRAVENOUS | Status: AC
  Filled 2013-12-21: qty 100

## 2013-12-21 MED ORDER — IBUPROFEN 600 MG PO TABS: 600.0000 mg | ORAL_TABLET | Freq: Four times a day (QID) | ORAL | Status: DC | PRN

## 2013-12-21 MED ORDER — ZOLPIDEM TARTRATE 5 MG PO TABS: 5.0000 mg | ORAL_TABLET | Freq: Every evening | ORAL | Status: DC | PRN

## 2013-12-21 MED ORDER — SIMETHICONE 80 MG PO CHEW
80.0000 mg | CHEWABLE_TABLET | Freq: Three times a day (TID) | ORAL | Status: DC
  Administered 2013-12-21 – 2013-12-25 (×11): 80 mg via ORAL
  Filled 2013-12-21 (×12): qty 1

## 2013-12-21 MED ORDER — SODIUM CHLORIDE 0.9 % IV SOLN
2.0000 g | Freq: Four times a day (QID) | INTRAVENOUS | Status: DC
  Administered 2013-12-21: 2 g via INTRAVENOUS
  Filled 2013-12-21 (×2): qty 2000

## 2013-12-21 MED ORDER — ONDANSETRON HCL 4 MG/2ML IJ SOLN
INTRAMUSCULAR | Status: DC | PRN
  Administered 2013-12-21: 4 mg via INTRAVENOUS

## 2013-12-21 MED ORDER — LACTATED RINGERS IV SOLN: INTRAVENOUS | Status: DC

## 2013-12-21 MED ORDER — ONDANSETRON HCL 4 MG/2ML IJ SOLN: 4.0000 mg | INTRAMUSCULAR | Status: DC | PRN

## 2013-12-21 MED ORDER — PRENATAL MULTIVITAMIN CH
1.0000 | ORAL_TABLET | Freq: Every day | ORAL | Status: DC
  Administered 2013-12-22 – 2013-12-25 (×4): 1 via ORAL
  Filled 2013-12-21 (×4): qty 1

## 2013-12-21 MED ORDER — MORPHINE SULFATE (PF) 0.5 MG/ML IJ SOLN
INTRAMUSCULAR | Status: DC | PRN
  Administered 2013-12-21: 4 mg via EPIDURAL

## 2013-12-21 MED ORDER — NALOXONE HCL 0.4 MG/ML IJ SOLN: 0.4000 mg | INTRAMUSCULAR | Status: DC | PRN

## 2013-12-21 MED ORDER — SCOPOLAMINE 1 MG/3DAYS TD PT72
1.0000 | MEDICATED_PATCH | Freq: Once | TRANSDERMAL | Status: AC
Start: 2013-12-21 — End: 2013-12-24
  Administered 2013-12-21: 1.5 mg via TRANSDERMAL

## 2013-12-21 MED ORDER — SENNOSIDES-DOCUSATE SODIUM 8.6-50 MG PO TABS
2.0000 | ORAL_TABLET | ORAL | Status: DC
  Administered 2013-12-21 – 2013-12-24 (×4): 2 via ORAL
  Filled 2013-12-21 (×4): qty 2

## 2013-12-21 MED ORDER — MORPHINE SULFATE 0.5 MG/ML IJ SOLN
INTRAMUSCULAR | Status: AC
  Filled 2013-12-21: qty 10

## 2013-12-21 SURGICAL SUPPLY — 38 items
CLAMP CORD UMBIL (MISCELLANEOUS) IMPLANT
CLOTH BEACON ORANGE TIMEOUT ST (SAFETY) ×2 IMPLANT
DERMABOND ADVANCED (GAUZE/BANDAGES/DRESSINGS) ×1
DERMABOND ADVANCED .7 DNX12 (GAUZE/BANDAGES/DRESSINGS) ×1 IMPLANT
DRAPE LG THREE QUARTER DISP (DRAPES) IMPLANT
DRSG OPSITE POSTOP 4X10 (GAUZE/BANDAGES/DRESSINGS) ×2 IMPLANT
DURAPREP 26ML APPLICATOR (WOUND CARE) ×4 IMPLANT
ELECT REM PT RETURN 9FT ADLT (ELECTROSURGICAL) ×2
ELECTRODE REM PT RTRN 9FT ADLT (ELECTROSURGICAL) ×1 IMPLANT
EXTRACTOR VACUUM BELL STYLE (SUCTIONS) IMPLANT
GLOVE BIOGEL PI IND STRL 8 (GLOVE) ×1 IMPLANT
GLOVE BIOGEL PI INDICATOR 8 (GLOVE) ×1
GLOVE ECLIPSE 8.0 STRL XLNG CF (GLOVE) ×2 IMPLANT
GOWN STRL REUS W/TWL LRG LVL3 (GOWN DISPOSABLE) ×4 IMPLANT
KIT ABG SYR 3ML LUER SLIP (SYRINGE) ×2 IMPLANT
NEEDLE HYPO 18GX1.5 BLUNT FILL (NEEDLE) ×2 IMPLANT
NEEDLE HYPO 22GX1.5 SAFETY (NEEDLE) ×2 IMPLANT
NEEDLE HYPO 25X5/8 SAFETYGLIDE (NEEDLE) ×2 IMPLANT
NS IRRIG 1000ML POUR BTL (IV SOLUTION) ×2 IMPLANT
PACK C SECTION WH (CUSTOM PROCEDURE TRAY) ×2 IMPLANT
PAD OB MATERNITY 4.3X12.25 (PERSONAL CARE ITEMS) ×2 IMPLANT
RETRACTOR WND ALEXIS 25 LRG (MISCELLANEOUS) ×1 IMPLANT
RTRCTR C-SECT PINK 25CM LRG (MISCELLANEOUS) IMPLANT
RTRCTR WOUND ALEXIS 25CM LRG (MISCELLANEOUS) ×2
STAPLER VISISTAT 35W (STAPLE) IMPLANT
SUT CHROMIC 0 CT 1 (SUTURE) ×2 IMPLANT
SUT MNCRL 0 VIOLET CTX 36 (SUTURE) ×2 IMPLANT
SUT MONOCRYL 0 CTX 36 (SUTURE) ×2
SUT PLAIN 2 0 (SUTURE)
SUT PLAIN 2 0 XLH (SUTURE) IMPLANT
SUT PLAIN ABS 2-0 CT1 27XMFL (SUTURE) IMPLANT
SUT VIC AB 0 CTX 36 (SUTURE) ×1
SUT VIC AB 0 CTX36XBRD ANBCTRL (SUTURE) ×1 IMPLANT
SUT VIC AB 4-0 KS 27 (SUTURE) ×2 IMPLANT
SYR 20CC LL (SYRINGE) ×4 IMPLANT
TOWEL OR 17X24 6PK STRL BLUE (TOWEL DISPOSABLE) ×2 IMPLANT
TRAY FOLEY CATH 14FR (SET/KITS/TRAYS/PACK) IMPLANT
WATER STERILE IRR 1000ML POUR (IV SOLUTION) ×2 IMPLANT

## 2013-12-21 NOTE — Progress Notes (Signed)
Vonzella NippleChynna L Simkins is a 23 y.o. G1P0 at 5074w2d by ultrasound admitted for PROM  Subjective: Pt awoke about [phone] with complaint of shaking  P 150 t 100.8 FHR stable sats 98 on RA 100 on rebreather No SOB  started a 2nd IV drew labs for baseline status and checked cervix which is unchanged  As a result will proceed with primary Caesarean section  Objective: BP 159/81  Pulse 140  Temp(Src) 99.7 F (37.6 C) (Oral)  Resp 20  Ht 5\' 3"  (1.6 m)  Wt 135 lb (61.236 kg)  BMI 23.92 kg/m2  SpO2 100%  LMP 04/04/2013 I/O last 3 completed shifts: In: -  Out: 1000 [Urine:1000]    FHT:  FHR: 150 bpm, variability: minimal ,  accelerations:  Present,  decelerations:  Absent UC:   regular, every 3 minutes SVE:   Dilation: 5 Effacement (%): 90 Station: -1 Exam by:: Doreatha MassedF. Morris, RNC   Dr Despina Hiddeneure exam  Labs: Lab Results  Component Value Date   WBC 21.8* 12/21/2013   HGB 11.4* 12/21/2013   HCT 34.2* 12/21/2013   MCV 84.0 12/21/2013   PLT 188 12/21/2013    Assessment / Plan: Arrest in active phase of labor  Labor: arrested Preeclampsia:  no signs or symptoms of toxicity Fetal Wellbeing:  Category II Pain Control:  Epidural I/D:  n/a Anticipated MOD:  primary section  Jashad Depaula H 12/21/2013, 7:21 AM

## 2013-12-21 NOTE — Progress Notes (Signed)
Pt rang call bell and requested RN come to bedside.  RN in room, pt c/o uncontrollable shaking & nausea. States sleeping & woke up shaking.  Denies SOB or "feeling bad."  Pulse oximeter applied & pt repositioned into right lateral.

## 2013-12-21 NOTE — Progress Notes (Signed)
UR chart review completed.  

## 2013-12-21 NOTE — Anesthesia Postprocedure Evaluation (Signed)
  Anesthesia Post Note  Patient: Sheila [name]  Procedure(s) Performed: Procedure(s) (LRB): CESAREAN SECTION (N/A)  Anesthesia type: Epidural  Patient location: PACU  Post pain: Pain level controlled  Post assessment: Post-op Vital signs reviewed  Last Vitals:  Filed Vitals:   12/21/13 1015  BP: 131/80  Pulse: 108  Temp:   Resp: 17    Post vital signs: Reviewed  Level of consciousness: awake  Complications: No apparent anesthesia complications

## 2013-12-21 NOTE — Lactation Note (Signed)
This note was copied from the chart of Sheila [name]. Lactation Consultation Note  Initial visit done with mom.  She is pumping for the second time.  Reviewed preemie setting, pump schedule and cleaning of parts.  Taught mom hand expression and 2 mls obtained.  Mom had breast augmentation 2 years ago.  Providing breastmilk for your infant in NICU given to mom.  She is using the pumping record in the book.  Mom is very sleepy so she may need reinforcement of teaching tomorrow.  Patient's mom very supportive.  Encouraged to call with concerns prn.  Patient Name: Sheila Fernanda DrumChynna Ambrosia WUXLK'GToday's Date: 12/21/2013 Reason for consult: Initial assessment;NICU baby   Maternal Data Formula Feeding for Exclusion: Yes Reason for exclusion: Previous breast surgery (mastectomy, reduction, or augmentation where mother is unable to produce breast milk) Infant to breast within first hour of birth: No Breastfeeding delayed due to:: Infant status Has patient been taught Hand Expression?: Yes Does the patient have breastfeeding experience prior to this delivery?: No  Feeding    LATCH Score/Interventions                      Lactation Tools Discussed/Used Pump Review: Setup, frequency, and cleaning;Milk Storage Initiated by:: RN Date initiated:: 12/21/13   Consult Status Consult Status: Follow-up Date: 12/22/13 Follow-up type: In-patient    Hansel Feinsteinowell, Landrey Mahurin Ann 12/21/2013, 5:18 PM

## 2013-12-21 NOTE — Progress Notes (Signed)
Dr. Despina HiddenEure @ bedside talking to pt and her mother regarding POC.  Pt & family informed considering no cervical change noted & possible onset of infection, Cesarean necessary.  Risks/benefits discussed with pt understanding verbalized.

## 2013-12-21 NOTE — Op Note (Signed)
Preoperative diagnosis:  1.  Intrauterine pregnancy at 9863w2d  weeks gestation                                         2.  PPROM                                         3.  Secondary arrest of descent and dilatation                                         4.  Chorioamnionitis   Postoperative diagnosis:  Same as above plus compound OP presentation(left elbow was presenting along with direct OP0  Procedure:  Primary cesarean section  Surgeon:  Lazaro ArmsLuther H Eure MD  Assistant:    Anesthesia: Spinal  Findings:  Pt had another unusual event this am.  She became suddenly tachycardic and was symptomatic.  She did not drop her sats.  Her EKG was sinus tachycardia.  I fluid resuscitated her and drew labs for baseline.  Her cervical exam was unchanged and given the overall clinical scenario decided to proceed with primary section  Over a low transverse incision was delivered a viable female with Apgars of 1 and 2 and 5 , the infant went to NICU intubated, weighing  lbs.  oz. 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  There were no other significant findings  Description of operation:  Patient was taken to the operating room and placed in the sitting position where she underwent a spinal anesthetic. She was then placed in the supine position with tilt to the left side. When adequate anesthetic level was obtained she was prepped and draped in usual sterile fashion and a Foley catheter was placed. A Pfannenstiel skin incision was made and carried down sharply to the rectus fascia which was scored in the midline extended laterally. The fascia was taken off the muscles both superiorly and without difficulty. The muscles were divided.  The peritoneal cavity was entered.  Bladder blade was placed, no bladder flap was created.  A low transverse hysterotomy incision was made and delivered a viable female  infant at [id number] with Apgars of 1 and 2 and 5, weighing lbs  oz.  Cord pH was obtained and was  7.286. The uterus was exteriorized. It was closed in 2 layers, the first being a running interlocking layer and the second being an imbricating layer using 0 monocryl on a CTX needle. There was good resulting hemostasis. The uterus tubes and ovaries were all normal. Peritoneal cavity was irrigated vigorously. The muscles and peritoneum were reapproximated loosely. The fascia was closed using 0 Vicryl in running fashion. Subcutaneous tissue was made hemostatic and irrigated. The skin was closed using 4-0 Vicryl on a Keith needle in a subcuticular fashion.  Dermabond was placed for additional wound integrity and to serve as a barrier. Blood loss for the procedure was 500 cc. The patient received gentamicin and ampicillin pre operatively as coverage for her chorioamnionitis prophylactically. The patient was taken to the recovery room in good stable condition with all counts being correct x3.  EBL 500 cc  EURE,LUTHER H 12/21/2013 9:06 AM

## 2013-12-21 NOTE — Transfer of Care (Signed)
Immediate Anesthesia Transfer of Care Note  Patient: Sheila [name]  Procedure(s) Performed: Procedure(s): CESAREAN SECTION (N/A)  Patient Location: PACU  Anesthesia Type:Epidural  Level of Consciousness: awake, alert  and oriented  Airway & Oxygen Therapy: Patient Spontanous Breathing and Patient connected to nasal cannula oxygen  Post-op Assessment: Report given to PACU RN and Post -op Vital signs reviewed and stable  Post vital signs: Reviewed and stable  Complications: No apparent anesthesia complications

## 2013-12-22 ENCOUNTER — Inpatient Hospital Stay (HOSPITAL_COMMUNITY): Payer: Medicaid Other

## 2013-12-22 ENCOUNTER — Encounter (HOSPITAL_COMMUNITY): Payer: Self-pay | Admitting: Obstetrics & Gynecology

## 2013-12-22 LAB — COMPREHENSIVE METABOLIC PANEL
ALT: 13 U/L (ref 0–35)
ANION GAP: 14 (ref 5–15)
AST: 27 U/L (ref 0–37)
Albumin: 1.9 g/dL — ABNORMAL LOW (ref 3.5–5.2)
Alkaline Phosphatase: 150 U/L — ABNORMAL HIGH (ref 39–117)
BUN: 15 mg/dL (ref 6–23)
CO2: 24 mEq/L (ref 19–32)
Calcium: 7.7 mg/dL — ABNORMAL LOW (ref 8.4–10.5)
Chloride: 97 mEq/L (ref 96–112)
Creatinine, Ser: 1.96 mg/dL — ABNORMAL HIGH (ref 0.50–1.10)
GFR calc Af Amer: 41 mL/min — ABNORMAL LOW (ref >90)
GFR calc non Af Amer: 35 mL/min — ABNORMAL LOW (ref >90)
Glucose, Bld: 94 mg/dL (ref 70–99)
Potassium: 3.5 mEq/L — ABNORMAL LOW (ref 3.7–5.3)
SODIUM: 135 meq/L — AB (ref 137–147)
TOTAL PROTEIN: 5.3 g/dL — AB (ref 6.0–8.3)
Total Bilirubin: 0.6 mg/dL (ref 0.3–1.2)

## 2013-12-22 LAB — URINALYSIS, ROUTINE W REFLEX MICROSCOPIC
BILIRUBIN URINE: NEGATIVE
GLUCOSE, UA: NEGATIVE mg/dL
Ketones, ur: NEGATIVE mg/dL
Nitrite: NEGATIVE
Protein, ur: NEGATIVE mg/dL
Specific Gravity, Urine: <1.005 — ABNORMAL LOW (ref 1.005–1.030)
UROBILINOGEN UA: 0.2 mg/dL (ref 0.0–1.0)
pH: 6 (ref 5.0–8.0)

## 2013-12-22 LAB — URINE MICROSCOPIC-ADD ON

## 2013-12-22 LAB — PROTEIN / CREATININE RATIO, URINE
CREATININE, URINE: 24.26 mg/dL
Protein Creatinine Ratio: 0.17 — ABNORMAL HIGH (ref 0.00–0.15)
Total Protein, Urine: 4.2 mg/dL

## 2013-12-22 LAB — CBC
HCT: 27.2 % — ABNORMAL LOW (ref 36.0–46.0)
HEMOGLOBIN: 9 g/dL — AB (ref 12.0–15.0)
MCH: 27.4 pg (ref 26.0–34.0)
MCHC: 33.1 g/dL (ref 30.0–36.0)
MCV: 82.7 fL (ref 78.0–100.0)
Platelets: 179 10*3/uL (ref 150–400)
RBC: 3.29 MIL/uL — ABNORMAL LOW (ref 3.87–5.11)
RDW: 14.1 % (ref 11.5–15.5)
WBC: 21.6 10*3/uL — ABNORMAL HIGH (ref 4.0–10.5)

## 2013-12-22 LAB — SODIUM, URINE, RANDOM: SODIUM UR: 65 meq/L

## 2013-12-22 MED ORDER — OXYCODONE HCL 5 MG PO TABS
5.0000 mg | ORAL_TABLET | Freq: Once | ORAL | Status: AC
  Administered 2013-12-22: 5 mg via ORAL
  Filled 2013-12-22: qty 1

## 2013-12-22 NOTE — Progress Notes (Signed)
Pressure dressing (folded 4x4) placed over epidural site by night nurse. Dressing saturated with serous fluid at 11:00am.  Dressing replaced at 11:00am.  At 12:45pm dressing was saturated again with pink tinged serous fluid.  NT placed abd pad over site, RN checked at 1:30pm and abd pad was dry.  Spoke to Philipp DeputyKim Shaw, CNM and she asked that anesthesia be notified.    Spoke to Dr. Jean RosenthalJackson, anesthesiologist, who stated that the fluid was most likely interstitial fluid from edema in the surrounding tissue and that nursing should keep the epidural site dressed until drainage subsides.

## 2013-12-22 NOTE — Progress Notes (Signed)
23 year old G1p0101 s/p c-section at 6263w2d.  Subjective: Postpartum Day 1: Cesarean Delivery Patient reports tolerating PO, + flatus and no problems voiding.   Concerned about her lower extremity edema, which she says is worse than before her pregnancy.  Objective: Vital signs in last 24 hours: Temp:  [97.5 F (36.4 C)-98.8 F (37.1 C)] 97.7 F (36.5 C) (07/24 0654) Pulse Rate:  [92-108] 92 (07/24 0654) Resp:  [13-18] 18 (07/24 0654) BP: (95-140)/(56-91) 95/60 mmHg (07/24 0654) SpO2:  [95 %-100 %] 100 % (07/24 0654)  Physical Exam:  General: alert, cooperative and no distress Lochia: appropriate Uterine Fundus: firm Incision: healing well, no significant drainage, no significant erythema DVT Evaluation: No evidence of DVT seen on physical exam. Negative Homan's sign. 2+ pitting lower extremity edema.   Recent Labs  12/21/13 1750 12/22/13 0525  HGB 8.9* 9.0*  HCT 27.3* 27.2*    Assessment/Plan: Status post Cesarean section. Patient has considerable lower extremity edema. Creatinine is elevated and somewhat higher today at 1.96. Stopping her home lasix dose. Check urinalyis and renal ultrasound to evaluate for hydronephrosis or renal anomaly. Urine output has been elevated and it is possible she is volume depleted. Check urine sodium to calculate FENA and protein creatinine ratio.  Marland Kitchen.  Markus JarvisStephens, Sheila [name] A 12/22/2013, 9:03 AM

## 2013-12-22 NOTE — Progress Notes (Addendum)
Pressure dressing to epidural site saturated. CRNA Netty Starringeborah Merritt notified. Advised to change dressing at this time. No c/o of HA or visual disturbances noted. New dressing applied. Pt abdomen distended, bowel sounds faint. Pt not passing flatus. Warm po fluids and ambulation encouraged.Pt requires encouragement with ambulation.  Scheduled Mylicon given. Decrease in pain noted after ambulating and warm fluids. Flatus present. Will continue to monitor.

## 2013-12-22 NOTE — Anesthesia Postprocedure Evaluation (Signed)
Anesthesia Post Note  Patient: Sheila [name]  Procedure(s) Performed: Procedure(s) (LRB): CESAREAN SECTION (N/A)  Anesthesia type: Epidural  Patient location: Women's unit  Post pain: Pain level controlled  Post assessment: Post-op Vital signs reviewed  Last Vitals:  Filed Vitals:   12/22/13 0654  BP: 95/60  Pulse: 92  Temp: 36.5 C  Resp: 18    Post vital signs: Reviewed  Level of consciousness:alert  Complications: No apparent anesthesia complications

## 2013-12-22 NOTE — Addendum Note (Signed)
Addendum created 12/22/13 1025 by Algis GreenhouseLinda A Elianah Karis, CRNA   Modules edited: Notes Section   Notes Section:  File: [id number]

## 2013-12-22 NOTE — Anesthesia Postprocedure Evaluation (Deleted)
Anesthesia Post Note  Patient: Sheila [name]  Procedure(s) Performed: Procedure(s) (LRB): CESAREAN SECTION (N/A)  Anesthesia type: Epidural  Patient location: Mother/Baby  Post pain: Pain level controlled  Post assessment: Post-op Vital signs reviewed  Last Vitals:  Filed Vitals:   12/22/13 0654  BP: 95/60  Pulse: 92  Temp: 36.5 C  Resp: 18    Post vital signs: Reviewed  Level of consciousness:alert  Complications: No apparent anesthesia complications

## 2013-12-22 NOTE — Progress Notes (Signed)
12/22/13 1700  Clinical Encounter Type  Visited With Patient and family together (mom Vernona RiegerLaura)  Referral From Nurse Salena Saner(Julie Potts, RN)  Spiritual Encounters  Spiritual Needs Emotional   Acquainted with [name] from her ante stay in May.  Followed up to offer further spiritual and emotional support, which her mom really appreciated.  Provided pastoral presence, encouragement, and info about ongoing chaplain availability.    [address]Chaplain Shaquasia Caponigro Bluford[place]unde[place], [place] [phone]

## 2013-12-22 NOTE — Progress Notes (Signed)
Clinical Social Work Department PSYCHOSOCIAL ASSESSMENT - MATERNAL/CHILD 12/22/2013  Patient:  [name],[name]  Account Number:  [id number]  Admit Date:  12/19/2013  Childs Name:   Sheila [name]    Clinical Social Worker:  Montarius Kitagawa, LCSW   Date/Time:  12/22/2013 05:30 PM  Date Referred:  12/21/2013   Referral source  NICU     Referred reason  NICU   Other referral source:    I:  FAMILY / HOME ENVIRONMENT Child's legal guardian:  PARENT  Guardian - Name Guardian - Age Guardian - Address  Sheila [name] 22 [address] [place] [address]  Jordan [name]  currently incarcerated in [place] [facility]   Other household support members/support persons Name Relationship DOB  Laura Petterson MOTHER    OTHER    Other support:   [name] states she has a good support system.  She is currently living with her mother and mother's fiance and plans to remain there at least for the first year od baby's life.    II  PSYCHOSOCIAL DATA Information Source:  Family Interview  Financial and Community Resources Employment:   [name] states she works at [employer] and has the means to get baby and [name] what they need.  She states her fiance works in logistics for a trucking company in [place].  [name] is not working.  [name] is in jail.   Financial resources:  Medicaid If Medicaid - County:  [place]  School / Grade:   Maternity Care Coordinator / Child Services Coordination / Early Interventions:  Cultural issues impacting care:   None stated    III  STRENGTHS Strengths  Adequate Resources  Compliance with medical plan  Home prepared for Child (including basic supplies)  Other - See comment  Supportive family/friends   Strength comment:  Pediatric follow up will be at [facility] in [place]   IV  RISK FACTORS AND CURRENT PROBLEMS Current Problem:  None   Risk Factor & Current Problem Patient Issue Family Issue Risk Factor / Current Problem Comment   N N     V  SOCIAL WORK ASSESSMENT  CSW met with  [name] in her third floor room/311 to introduce myself and complete assessment due to NICU admission.  [name] was present and [name] stated we could talk with her in the room.  [name] did most of the talking for [name].  [name] would answer questions directed at her when CSW kept eye contact on [name].  [name] seems very protective of her daughter.  They report [name] is not feeling well today physically or emotionally.  They state frustration with tests she has had that they will not have results for until tomorrow.  [name] states they have been told that the results are back, but the doctor will not read them and discuss them with patient until tomorrow.  CSW validated their feelings of frustration related to the unplanned c/section and complications [name] is having, but assured them that there is always a doctor here 24 hours a day that will be able to respond in the event of an emergency.  [name] states she is hopeful baby will get to go home when she is discharged.  CSW encouraged her to not have any expectations and allow baby to have the time he needs as she does not want him home before he is ready.  CSW states if there is anything positive that she can identify, such as having some time to recuperate, she should focus on this.  CSW   also encouraged her to embrace this as her unique birth experience, however unplanned, and not wish this time of bonding with baby away.  [name] states she will make sure [name] will be able to be here as much as possible if baby remains past [name]'s discharge even though they live in [place] and [name] will not be able to drive after surgery.  Family states they have everything they need for baby at home.  They state no questions about baby's care.  CSW discussed common emotions and encouraged [name] to allow herself to be emotional, but to watch for signs and symptoms of PPD, which CSW discussed.  [name] stated, "I'm always depressed."  To which her mother said, "you're not always depressed."  CSW asked [name] if she has ever seen a  counselor in the past or if she has one now.  She states she had one when her parents got divorced.  Her mother stated she was not in to it and didn't want to be there.  She states [name] was 73-22 years old. (23)  CSW encouraged [name] to consider counseling if she is often feeling depressed and asked her to call CSW if she would like more information.  CSW would like to explore this more with [name] privately if possible.  [name] states she will consider this and states she feels comfortable talking with her doctor if she has concerns about her emotions at any time.  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   VI SOCIAL WORK PLAN Social Work Therapist, art  Psychosocial Support/Ongoing Assessment of Needs   Type of pt/family education:   PPD signs and symptoms  Common emotions related to a NICU admssion  Ongoing support services offered by NICU CSW   If child protective services report - county:   If child protective services report - date:   Information/referral to community resources comment:   Information and or referral to outpatient counseling if [name] decides she is interested.   Other social work plan:

## 2013-12-22 NOTE — Progress Notes (Signed)
Pt states bed and back of gown feels wet. Upon inspection, clear fluid seeping from epidural site. CRNA Netty Starringeborah Merritt notified. No c/o of headaches or visual disturbances noted. CRNA to bedside. Pressure dressing applied to site and advised to call if becomes saturated.

## 2013-12-22 NOTE — Addendum Note (Signed)
Addendum created 12/22/13 1027 by Algis GreenhouseLinda A Rudy Domek, CRNA   Modules edited: Notes Section   Notes Section:  File: [id number]; Delete: [id number]

## 2013-12-22 NOTE — Progress Notes (Signed)
Spoke with CRNA, E. Speights, as she was rounding on floor and updated her of epidural site continuing to leak serous fluid, frequency of dressing changes, and that the pt is able to ambulate and has no complaints of HA at this time... CRNA stated to keep pressure on site and continue to keep site covered. Will continue to monitor pt.

## 2013-12-22 NOTE — Progress Notes (Signed)
Upon shift change, RN's checked epidural site... ABD pad somewhat saturated... RN's removed dressing, and noted small amount of pink tinged serous fluid... Epidural puncture site still leaking serous fluid... ABD dressing changed... Will continue to monitor pt.

## 2013-12-23 LAB — BASIC METABOLIC PANEL
ANION GAP: 12 (ref 5–15)
BUN: 21 mg/dL (ref 6–23)
CO2: 26 mEq/L (ref 19–32)
Calcium: 7.6 mg/dL — ABNORMAL LOW (ref 8.4–10.5)
Chloride: 100 mEq/L (ref 96–112)
Creatinine, Ser: 1.81 mg/dL — ABNORMAL HIGH (ref 0.50–1.10)
GFR, EST AFRICAN AMERICAN: 45 mL/min — AB (ref >90)
GFR, EST NON AFRICAN AMERICAN: 39 mL/min — AB (ref >90)
Glucose, Bld: 72 mg/dL (ref 70–99)
POTASSIUM: 3.4 meq/L — AB (ref 3.7–5.3)
Sodium: 138 mEq/L (ref 137–147)

## 2013-12-23 LAB — CBC
HCT: 23.2 % — ABNORMAL LOW (ref 36.0–46.0)
Hemoglobin: 7.7 g/dL — ABNORMAL LOW (ref 12.0–15.0)
MCH: 27.3 pg (ref 26.0–34.0)
MCHC: 33.2 g/dL (ref 30.0–36.0)
MCV: 82.3 fL (ref 78.0–100.0)
PLATELETS: 170 10*3/uL (ref 150–400)
RBC: 2.82 MIL/uL — AB (ref 3.87–5.11)
RDW: 13.8 % (ref 11.5–15.5)
WBC: 12.4 10*3/uL — ABNORMAL HIGH (ref 4.0–10.5)

## 2013-12-23 MED ORDER — POLYETHYLENE GLYCOL 3350 17 G PO PACK
17.0000 g | PACK | Freq: Two times a day (BID) | ORAL | Status: DC
  Administered 2013-12-23 – 2013-12-25 (×5): 17 g via ORAL
  Filled 2013-12-23 (×7): qty 1

## 2013-12-23 MED ORDER — FERROUS SULFATE 325 (65 FE) MG PO TABS
325.0000 mg | ORAL_TABLET | Freq: Two times a day (BID) | ORAL | Status: DC
  Administered 2013-12-23 – 2013-12-25 (×5): 325 mg via ORAL
  Filled 2013-12-23 (×5): qty 1

## 2013-12-23 NOTE — Progress Notes (Signed)
23 year old G1p0101 s/p c-section at 2533w2d.  Subjective: Postpartum Day 2: Cesarean Delivery Patient reports tolerating PO, + flatus and no problems voiding, no bowel movement Concerned about kidney problems, wonders what is going to happen.  Does not feel ready to go home yet  Objective: Vital signs in last 24 hours: Temp:  [97.6 F (36.4 C)-98.2 F (36.8 C)] 97.6 F (36.4 C) (07/25 0544) Pulse Rate:  [87-108] 87 (07/25 0544) Resp:  [18] 18 (07/25 0544) BP: (102-108)/(60-68) 106/66 mmHg (07/25 0544) SpO2:  [100 %] 100 % (07/25 0544) Weight:  [136 lb (61.689 kg)] 136 lb (61.689 kg) (07/25 0540)  Physical Exam:  General: alert, cooperative and no distress Lochia: appropriate Uterine Fundus: firm, grimaces with any abdominal exam and difficult to examine.  Denies pain initially but appears in pain. Incision: healing well, no significant drainage, no significant erythema DVT Evaluation: No evidence of DVT seen on physical exam. Negative Homan's sign. 2+ pitting lower extremity edema.   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  Kidney disease stage 3 ==> refer to nephrology as outpt, bilateral hydronephrosis with medical renal disease, FeNA 3% however pt received lasix yesterday, no urea ordered, low yield  Anemia: start ferrous sulfate BID  Discharge planning: discharge  tomorrow  Sheila [name] 12/23/2013, 7:55 AM

## 2013-12-23 NOTE — Progress Notes (Signed)
Faculty Practice OB/GYN Attending Note  Called to discuss elevated Cr with patient and her family; they are very concerned.  Elevated Cr noted after cesarean delivery, no baseline Cr available for comparison. Excellent UOP since delivery.  Lab Results  Component Value Date   CREATININE 1.81* 12/23/2013   CREATININE 1.96* 12/22/2013   CREATININE 1.90* 12/21/2013  12/22/13 Urine Pr: Cr 0.17  UCr 24.26 UNa  Serum Na 138 65 FeNa 3.51% (after receiving Lasix)  12/22/2013   RENAL/URINARY TRACT ULTRASOUND COMPLETE CLINICAL DATA:  One day postop from C-section delivery. Elevated creatinine and edema. Patient has a uterine anomaly with didelphys.   COMPARISON:  None.  FINDINGS: Right Kidney:  Length: 11.4 cm. Moderate hydronephrosis. Diffuse increased renal parenchymal echogenicity. No renal mass or stone.  Left Kidney:  Length: 10.9 cm. Echogenicity is borderline increased. Moderate hydronephrosis, less prominent than seen on the right. No mass or stone.  Bladder:  Appears normal for degree of bladder distention. Neither ureteral jet was visualized.  Neither ureter could be imaged.  IMPRESSION: 1. Moderate bilateral hydronephrosis, greater on the right. The etiology of this is unclear. No renal stone is seen. Ureters were not visualized. Neither ureteral jet was imaged in the bladder. Imaging of the bladder was technically limited due to patient's pelvic discomfort. 2. Increased parenchymal echogenicity on the right with borderline increased parenchymal echogenicity on the left. This suggests medical renal disease. 3. No other abnormality.   Electronically Signed   By: Amie Portlandavid  Ormond M.D.   On: 12/22/2013 11:37   Discussed that there is no known or certain etiology for this elevated Cr.  Further diuretics and NSAIDs have been discontinued and the Cr is trending down. Normal UOP, no need for any acute intervention.  Will follow up tomorrow's labs.  If still elevated around time of discharge, will need outpatient  nephrology evaluation.    Patient and her family were reassured. Also reassured that her postpartum bilateral extremity edema is not related to the elevated Cr or epidural placement; this is a common postpartum phenomenon due to extravasation of fluid received during labor and during surgery.   Will continue routine postpartum care.  Jaynie CollinsUGONNA  Xinyi Batton, MD, FACOG Attending Obstetrician & Gynecologist Faculty Practice, [facility]

## 2013-12-23 NOTE — Progress Notes (Signed)
Attestation of Attending Supervision of Resident: Evaluation and management procedures were performed by the Family Medicine Resident under my supervision.  I have seen and examined the patient, reviewed the resident's note and chart, and I agree with the management and plan.  UGONNA  ANYANWU, MD, FACOG Attending Obstetrician & Gynecologist Faculty Practice, [facility]    

## 2013-12-24 LAB — COMPREHENSIVE METABOLIC PANEL
ALBUMIN: 1.9 g/dL — AB (ref 3.5–5.2)
ALT: 15 U/L (ref 0–35)
AST: 25 U/L (ref 0–37)
Alkaline Phosphatase: 137 U/L — ABNORMAL HIGH (ref 39–117)
Anion gap: 12 (ref 5–15)
BUN: 13 mg/dL (ref 6–23)
CO2: 27 meq/L (ref 19–32)
Calcium: 7.8 mg/dL — ABNORMAL LOW (ref 8.4–10.5)
Chloride: 101 mEq/L (ref 96–112)
Creatinine, Ser: 1.25 mg/dL — ABNORMAL HIGH (ref 0.50–1.10)
GFR calc Af Amer: 71 mL/min — ABNORMAL LOW (ref >90)
GFR calc non Af Amer: 61 mL/min — ABNORMAL LOW (ref >90)
Glucose, Bld: 83 mg/dL (ref 70–99)
POTASSIUM: 3.7 meq/L (ref 3.7–5.3)
Sodium: 140 mEq/L (ref 137–147)
Total Bilirubin: 0.3 mg/dL (ref 0.3–1.2)
Total Protein: 5.4 g/dL — ABNORMAL LOW (ref 6.0–8.3)

## 2013-12-24 LAB — CBC
HEMATOCRIT: 22.7 % — AB (ref 36.0–46.0)
Hemoglobin: 7.5 g/dL — ABNORMAL LOW (ref 12.0–15.0)
MCH: 27.2 pg (ref 26.0–34.0)
MCHC: 33 g/dL (ref 30.0–36.0)
MCV: 82.2 fL (ref 78.0–100.0)
Platelets: 212 10*3/uL (ref 150–400)
RBC: 2.76 MIL/uL — AB (ref 3.87–5.11)
RDW: 13.7 % (ref 11.5–15.5)
WBC: 13.7 10*3/uL — AB (ref 4.0–10.5)

## 2013-12-24 MED ORDER — CYCLOBENZAPRINE HCL 10 MG PO TABS
10.0000 mg | ORAL_TABLET | Freq: Three times a day (TID) | ORAL | Status: DC | PRN
  Administered 2013-12-24 (×2): 10 mg via ORAL
  Filled 2013-12-24 (×2): qty 1

## 2013-12-24 MED ORDER — BISACODYL 10 MG RE SUPP
10.0000 mg | Freq: Every day | RECTAL | Status: DC | PRN
Start: 2013-12-24 — End: 2013-12-25

## 2013-12-24 NOTE — Progress Notes (Signed)
23 year old G1P0101 POD#3  s/p c-section at 263w2d 2/2 face presentation, failed IOL, chorio, didelphys uterus.  Subjective: Patient reports tolerating PO, + flatus and no problems voiding, no bowel movement. Feeling very tense due to pain; makes it hard to get OOB, ambulate. Baby had feeding tube inserted in NICU yesterday, doing well.  Objective: Vital signs in last 24 hours: Temp:  [98.2 F (36.8 C)-99.5 F (37.5 C)] 99.5 F (37.5 C) (07/26 0527) Pulse Rate:  [95-106] 106 (07/26 0527) Resp:  [18-20] 18 (07/26 0527) BP: (106-118)/(62-63) 118/62 mmHg (07/26 0527) SpO2:  [97 %-100 %] 97 % (07/26 0527)  Physical Exam:  General: alert, cooperative and no distress Lochia: appropriate Uterine Fundus: firm, diffuse mild tenderness on exam,  Incision: healing well, no significant drainage, no significant erythema DVT Evaluation: No evidence of DVT seen on physical exam. Negative Homan's sign. 1+ lower extremity edema bilaterally.   Lab Results  Component Value Date   CREATININE 1.25* 12/24/2013   CREATININE 1.81* 12/23/2013   CREATININE 1.96* 12/22/2013   Lab Results  Component Value Date   CREATININE 1.25* 12/24/2013   BUN 13 12/24/2013   NA 140 12/24/2013   K 3.7 12/24/2013   CL 101 12/24/2013   CO2 27 12/24/2013   CBC Latest Ref Rng 12/24/2013 12/23/2013 12/22/2013  WBC 4.0 - 10.5 K/uL 13.7(H) 12.4(H) 21.6(H)  Hemoglobin 12.0 - 15.0 g/dL 7.5(L) 7.7(L) 9.0(L)  Hematocrit 36.0 - 46.0 % 22.7(L) 23.2(L) 27.2(L)  Platelets 150 - 400 K/uL 212 170 179    Assessment/Plan: Status post Cesarean section POD #3 2/2 face presentation, failed IOL, chorio, didelphys uterus. Elevated Cr:  Trended down to 1.25 today, patient is reassured. Continue to avoid diuretics, NSAIDs. Good UOP. Will reevaluate in the morning. Pain: Flexeril added to regimen. Avoid NSAIDs for now.  Encouraged OOB Continue iron therapy of anemia, GI regimen for constipation Discharge planning: Discharge tomorrow  morning Continue routine postpartum care  Aadvik Roker A, MD 12/24/2013, 7:37 AM

## 2013-12-25 LAB — BASIC METABOLIC PANEL
Anion gap: 12 (ref 5–15)
BUN: 11 mg/dL (ref 6–23)
CALCIUM: 8.7 mg/dL (ref 8.4–10.5)
CO2: 28 meq/L (ref 19–32)
Chloride: 100 mEq/L (ref 96–112)
Creatinine, Ser: 1.02 mg/dL (ref 0.50–1.10)
GFR calc Af Amer: >90 mL/min (ref >90)
GFR calc non Af Amer: 78 mL/min — ABNORMAL LOW (ref >90)
GLUCOSE: 78 mg/dL (ref 70–99)
Potassium: 4 mEq/L (ref 3.7–5.3)
Sodium: 140 mEq/L (ref 137–147)

## 2013-12-25 MED ORDER — CYCLOBENZAPRINE HCL 5 MG PO TABS: 5.0000 mg | ORAL_TABLET | Freq: Three times a day (TID) | ORAL | Status: DC | PRN

## 2013-12-25 MED ORDER — IBUPROFEN 800 MG PO TABS: 800.0000 mg | ORAL_TABLET | Freq: Three times a day (TID) | ORAL | Status: DC | PRN

## 2013-12-25 MED ORDER — OXYCODONE-ACETAMINOPHEN 5-325 MG PO TABS: 1.0000 | ORAL_TABLET | ORAL | Status: DC | PRN

## 2013-12-25 NOTE — Progress Notes (Signed)
Ur chart review completed.  

## 2013-12-25 NOTE — Discharge Instructions (Signed)

## 2013-12-25 NOTE — Lactation Note (Signed)
This note was copied from the chart of Sheila Chapel Douthat. Lactation Consultation Note       Follow up consult with this mom of a NICU baby, now 504 days old, and 36 6/7 weeks CGA. Mom is sad to be going home without him today. On exam, mom's areolas are red and tender, so I decreased her to 21 flanges, a better fit, and gave her comfort gels. Mom' s breasts appear much fuller than a few days ago, and she is begging to express about 5-10 mls of transitional milk. Mom and baby will be followed in the NICU.Late Pre term baby and how this effects breast feeding reviewed with mom and [name]. Also o/p lactation support reviewed, which seemed to relax mom some.   Patient Name: Sheila Fernanda DrumChynna Medine [id number]'s Date: 12/25/2013 Reason for consult: Follow-up assessment;NICU baby   Maternal Data    Feeding Feeding Type: Breast Milk with Formula added Nipple Type: Slow - flow Length of feed: 30 min  LATCH Score/Interventions                      Lactation Tools Discussed/Used Tools: Flanges;Comfort gels (gave mom gels and instructed her in their use) Flange Size:  (decreased mom to 21 flanges with better fit. Mom has sore areolas from 24's)   Consult Status Consult Status: PRN Follow-up type: In-patient (NICU)    Alfred LevinsLee, Dody Smartt Anne 12/25/2013, 10:52 AM

## 2013-12-25 NOTE — Discharge Summary (Signed)
Obstetric Discharge Summary Reason for Admission: rupture of membranes Prenatal Procedures: ultrasound Intrapartum Procedures: cesarean: low cervical, transverse Postpartum Procedures: antibiotics Complications-Operative and Postpartum: none Hemoglobin  Date Value Ref Range Status  12/24/2013 7.5* 12.0 - 15.0 g/dL Final     HCT  Date Value Ref Range Status  12/24/2013 22.7* 36.0 - 46.0 % Final    Physical Exam:  General: alert, cooperative and no distress Lochia: appropriate Uterine Fundus: firm Incision: healing well DVT Evaluation: No evidence of DVT seen on physical exam.  Discharge Diagnoses: Term Pregnancy-delivered  Discharge Information: Date: 12/25/2013 Activity: pelvic rest Diet: routine Medications: Ibuprofen, Percocet and flexeril Condition: stable Instructions: refer to practice specific booklet Discharge to: home Follow-up Information   Follow up with Lazaro ArmsEURE,LUTHER H, MD On 12/28/2013. (afternoon appt)    Specialties:  Obstetrics and Gynecology, Radiology   Contact information:   [address] [place] [id number] [phone]       Newborn Data: Live born female  Birth Weight: 6 lb 8.1 oz (2951 g) APGAR: 1, 2  Baby to stay in NICU for a few days.  EURE,LUTHER H 12/25/2013, 8:13 AM

## 2013-12-25 NOTE — Progress Notes (Signed)
Sheila [name] was in good spirits today, even though she is sad to be leaving her baby here in the NICU.  She and her family have worked out rides for her for the next several days so that she can be up here visiting her baby.  She is physically feeling somewhat better and is looking forward to being home  We will continue to provide support in the NICU.  [address]Chaplain Katy Dickenslaussen Pager, [phone] 3:07 PM   12/25/13 1500  Clinical Encounter Type  Visited With Patient  Visit Type Spiritual support  Referral From Chaplain

## 2013-12-28 ENCOUNTER — Ambulatory Visit (INDEPENDENT_AMBULATORY_CARE_PROVIDER_SITE_OTHER): Payer: Medicaid Other | Admitting: Obstetrics & Gynecology

## 2013-12-28 ENCOUNTER — Encounter: Payer: Self-pay | Admitting: Obstetrics & Gynecology

## 2013-12-28 VITALS — BP 130/80 | Wt 130.0 lb

## 2013-12-28 DIAGNOSIS — Z98891 History of uterine scar from previous surgery: Secondary | ICD-10-CM

## 2013-12-28 DIAGNOSIS — Z9889 Other specified postprocedural states: Secondary | ICD-10-CM

## 2013-12-28 NOTE — Progress Notes (Signed)
Patient ID: Sheila [name], female   DOB: 06/27/1990, 23 y.o.   MRN: [id number] Blood pressure 130/80, weight 130 lb (58.968 kg), last menstrual period 04/04/2013, unknown if currently breastfeeding. 1 week post op from LTCS Incision clean dry intact Some swelling peripherally moderate 2+ Pain meds mostly at evening  Follow up 2 weeks

## 2013-12-29 ENCOUNTER — Ambulatory Visit: Payer: Self-pay

## 2013-12-29 NOTE — Lactation Note (Signed)
This note was copied from the chart of Sheila [name]. Lactation Consultation Note  Mom and baby roomed in last night.  She is pumping 20-50 mls but continues to give baby formula by bottle every feeding although she states she is breastfeeding some.  Breasts are both full and baby showing feeding cues.  Mom positioned baby in cradle hold and baby opened wide and easily latched well. Baby nursed very well with audible swallows and gulping throughout feeding.Breast softened well. Baby again [facility] to opposite [facility].  He actively nursed with many swallows then came off breast relaxed and sleeping.  Discharge teaching done and questions answered. Mom told to keep a feeding diary the first week.  Reviewed adequate output.  Pump and bottlefeed anytime baby doesnt latch.  I feel this baby will be able to exclusively breastfeed with adequate milk transfer.  Encouraged to call with concerns prn.  Patient Name: Sheila [name] ZOXWR'UToday's Date: 12/29/2013     Maternal Data    Feeding Feeding Type: Formula Nipple Type: Regular  LATCH Score/Interventions                      Lactation Tools Discussed/Used     Consult Status      Hansel Feinsteinowell, Kallyn Demarcus Ann 12/29/2013, 11:52 AM

## 2014-01-01 NOTE — Addendum Note (Signed)
Addendum created 01/01/14 1701 by Brayton CavesFreeman Judie Hollick, MD   Modules edited: Anesthesia Responsible Staff

## 2014-01-11 ENCOUNTER — Ambulatory Visit (INDEPENDENT_AMBULATORY_CARE_PROVIDER_SITE_OTHER): Payer: Medicaid Other | Admitting: Obstetrics & Gynecology

## 2014-01-11 ENCOUNTER — Encounter: Payer: Self-pay | Admitting: Obstetrics & Gynecology

## 2014-01-11 VITALS — BP 120/80 | Wt 116.0 lb

## 2014-01-11 DIAGNOSIS — Z9889 Other specified postprocedural states: Secondary | ICD-10-CM

## 2014-01-11 NOTE — Progress Notes (Signed)
Patient ID: Sheila [name], female   DOB: 07/09/1990, 23 y.o.   MRN: [id number] Incision clean dry intact, derabond still flaking off with gentian violet Swelling much better Abdomen soft non tender No pain meds Still deciding on BCM  Follow up 3 weeks for pp visit Needs creatinine at pp visit

## 2014-01-19 ENCOUNTER — Telehealth: Payer: Self-pay | Admitting: *Deleted

## 2014-01-19 NOTE — Telephone Encounter (Signed)
Spoke with Lucy AntiguaKaren Weddle [facility]. Clydie BraunKaren states pt scored a 14 on Edinberg scale today. Pt was on Zoloft in high school, but has been on nothing since then. I spoke with pt. She said the baby was fussy. Her mom helps her when she is home, but she works during the day. Pt states she is not suicidal at this point. I advised we could see her Monday, but if she got worse over the weekend, go to nearest ER. Pt voiced understanding. JSY

## 2014-01-22 ENCOUNTER — Ambulatory Visit (INDEPENDENT_AMBULATORY_CARE_PROVIDER_SITE_OTHER): Payer: Medicaid Other | Admitting: Obstetrics and Gynecology

## 2014-01-22 ENCOUNTER — Encounter: Payer: Self-pay | Admitting: Obstetrics and Gynecology

## 2014-01-22 VITALS — BP 118/62 | Ht 63.0 in | Wt 117.8 lb

## 2014-01-22 DIAGNOSIS — O99345 Other mental disorders complicating the puerperium: Secondary | ICD-10-CM

## 2014-01-22 DIAGNOSIS — F4329 Adjustment disorder with other symptoms: Secondary | ICD-10-CM

## 2014-01-22 MED ORDER — ESCITALOPRAM OXALATE 10 MG PO TABS: 10.0000 mg | ORAL_TABLET | Freq: Every day | ORAL | Status: DC

## 2014-01-22 NOTE — Progress Notes (Signed)
This chart was scribed by Leone Payor, Medical Scribe, for Dr. Christin Bach on 01/22/14 at 9:33 AM. This chart was reviewed by Dr. Christin Bach for accuracy.    [facility] Visit  Patient name: Sheila [name] MRN [id number]  Date of birth: [birth year]  CC & HPI:  Sheila [name] is a 23 y.o. female presenting today for possible post partum depression. Patient states her mother is helping her take care of the baby. Patient states the father of the baby is in prison which is contributing to her stress. She states she moved back in with her mother in May and will stay with her for about 1 year and patient is unsure what her plans will be afterwards. She states she does not have a job and stays at home with the baby. Patient states she feels trapped with the baby because she does not have help with childcare when her mother is working. Patient acknowledges she has not sought care and has no desire for anyone else to take care of the baby.   Patient states she cries when she is alone at home. She states the baby cries "a lot" and cries when he is not sleeping. Patient states she manages this by holding him, which she does the majority of the time the baby is not sleeping.   ROS:  She denies SI, HI.   Pertinent History Reviewed:   Reviewed: Significant for cesarean section  Medical         Past Medical History  Diagnosis Date  . Medical history non-contributory   . HSV-2 infection   . Nosebleed 09/06/2013  . Round ligament pain 09/06/2013                              Surgical Hx:    Past Surgical History  Procedure Laterality Date  . Bilateral breast implants  12/2011    . Tonsillectomy    . Cesarean section N/A 12/21/2013    Procedure: CESAREAN SECTION;  Surgeon: Lazaro Arms, MD;  Location: [facility];  Service: Obstetrics;  Laterality: N/A;   Medications: Reviewed & Updated - see associated section                      Current outpatient prescriptions:Iron-FA-B Cmp-C-Biot-Probiotic  (FUSION PLUS) CAPS, Take 1 capsule by mouth See admin instructions. 1 time daily between meals, Disp: 30 capsule, Rfl: 6;  Prenat-FeCbn-FeAspGl-FA-Omega (OB COMPLETE PETITE) 35-5-1-200 MG CAPS, Take 1 tablet by mouth daily., Disp: 30 capsule, Rfl: 11 cyclobenzaprine (FLEXERIL) 5 MG tablet, Take 1 tablet (5 mg total) by mouth 3 (three) times daily as needed for muscle spasms., Disp: 30 tablet, Rfl: 1;  FENUGREEK PO, Take by mouth., Disp: , Rfl: ;  ibuprofen (ADVIL,MOTRIN) 800 MG tablet, Take 1 tablet (800 mg total) by mouth every 8 (eight) hours as needed., Disp: 30 tablet, Rfl: 0 oxyCODONE-acetaminophen (ROXICET) 5-325 MG per tablet, Take 1-2 tablets by mouth every 4 (four) hours as needed for severe pain., Disp: 30 tablet, Rfl: 0;  valACYclovir (VALTREX) 1000 MG tablet, Take 1 tablet (1,000 mg total) by mouth daily., Disp: 30 tablet, Rfl: 3   Social History: Reviewed -  reports that she has never smoked. She has never used smokeless tobacco.  Objective Findings:  Vitals: Blood pressure 118/62, height  (1.6 m), weight 117 lb 12.8 oz (53.434 kg), currently breastfeeding.  Physical Examination: General appearance -  alert, well appearing, and in no distress and oriented to person, place, and time Mental status - alert, oriented to person, place, and time, normal mood, behavior, speech, dress, motor activity, and thought processes Pelvic - examination not indicated  Counseled patient for > 25 min.   Assessment & Plan:  A:  1. Mild situational stress, not major depression 2 Pt is well cared for in her mother's house at present, with all bills covered by mother.  P:  1. Trial Lexapro 10 daily,  2. Discuss whether to continue at [facility] visit with Cathie Beams Sep 3.

## 2014-01-22 NOTE — Patient Instructions (Signed)
Postpartum Depression and Baby Blues The postpartum period begins right after the birth of a baby. During this time, there is often a great amount of joy and excitement. It is also a time of many changes in the life of the parents. Regardless of how many times a mother gives birth, each child brings new challenges and dynamics to the family. It is not unusual to have feelings of excitement along with confusing shifts in moods, emotions, and thoughts. All mothers are at risk of developing postpartum depression or the "baby blues." These mood changes can occur right after giving birth, or they may occur many months after giving birth. The baby blues or postpartum depression can be mild or severe. Additionally, postpartum depression can go away rather quickly, or it can be a long-term condition.  CAUSES Raised hormone levels and the rapid drop in those levels are thought to be a main cause of postpartum depression and the baby blues. A number of hormones change during and after pregnancy. Estrogen and progesterone usually decrease right after the delivery of your baby. The levels of thyroid hormone and various cortisol steroids also rapidly drop. Other factors that play a role in these mood changes include major life events and genetics.  RISK FACTORS If you have any of the following risks for the baby blues or postpartum depression, know what symptoms to watch out for during the postpartum period. Risk factors that may increase the likelihood of getting the baby blues or postpartum depression include:  Having a personal or family history of depression.   Having depression while being pregnant.   Having premenstrual mood issues or mood issues related to oral contraceptives.  Having a lot of life stress.   Having marital conflict.   Lacking a social support network.   Having a baby with special needs.   Having health problems, such as diabetes.  SIGNS AND SYMPTOMS Symptoms of baby blues  include:  Brief changes in mood, such as going from extreme happiness to sadness.  Decreased concentration.   Difficulty sleeping.   Crying spells, tearfulness.   Irritability.   Anxiety.  Symptoms of postpartum depression typically begin within the first month after giving birth. These symptoms include:  Difficulty sleeping or excessive sleepiness.   Marked weight loss.   Agitation.   Feelings of worthlessness.   Lack of interest in activity or food.  Postpartum psychosis is a very serious condition and can be dangerous. Fortunately, it is rare. Displaying any of the following symptoms is cause for immediate medical attention. Symptoms of postpartum psychosis include:   Hallucinations and delusions.   Bizarre or disorganized behavior.   Confusion or disorientation.  DIAGNOSIS  A diagnosis is made by an evaluation of your symptoms. There are no medical or lab tests that lead to a diagnosis, but there are various questionnaires that a health care provider may use to identify those with the baby blues, postpartum depression, or psychosis. Often, a screening tool called the Edinburgh Postnatal Depression Scale is used to diagnose depression in the postpartum period.  TREATMENT The baby blues usually goes away on its own in 1-2 weeks. Social support is often all that is needed. You will be encouraged to get adequate sleep and rest. Occasionally, you may be given medicines to help you sleep.  Postpartum depression requires treatment because it can last several months or longer if it is not treated. Treatment may include individual or group therapy, medicine, or both to address any social, physiological, and psychological   factors that may play a role in the depression. Regular exercise, a healthy diet, rest, and social support may also be strongly recommended.  Postpartum psychosis is more serious and needs treatment right away. Hospitalization is often needed. HOME CARE  INSTRUCTIONS  Get as much rest as you can. Nap when the baby sleeps.   Exercise regularly. Some women find yoga and walking to be beneficial.   Eat a balanced and nourishing diet.   Do little things that you enjoy. Have a cup of tea, take a bubble bath, read your favorite magazine, or listen to your favorite music.  Avoid alcohol.   Ask for help with household chores, cooking, grocery shopping, or running errands as needed. Do not try to do everything.   Talk to people close to you about how you are feeling. Get support from your partner, family members, friends, or other new moms.  Try to stay positive in how you think. Think about the things you are grateful for.   Do not spend a lot of time alone.   Only take over-the-counter or prescription medicine as directed by your health care provider.  Keep all your postpartum appointments.   Let your health care provider know if you have any concerns.  SEEK MEDICAL CARE IF: You are having a reaction to or problems with your medicine. SEEK IMMEDIATE MEDICAL CARE IF:  You have suicidal feelings.   You think you may harm the baby or someone else. MAKE SURE YOU:  Understand these instructions.  Will watch your condition.  Will get help right away if you are not doing well or get worse. Document Released: 02/20/2004 Document Revised: 05/23/2013 Document Reviewed: 02/27/2013 ExitCare Patient Information 2015 [employer]. This information is not intended to replace advice given to you by your health care provider. Make sure you discuss any questions you have with your health care provider.  

## 2014-02-01 ENCOUNTER — Ambulatory Visit (INDEPENDENT_AMBULATORY_CARE_PROVIDER_SITE_OTHER): Payer: Medicaid Other | Admitting: Advanced Practice Midwife

## 2014-02-01 ENCOUNTER — Encounter: Payer: Self-pay | Admitting: Advanced Practice Midwife

## 2014-02-01 DIAGNOSIS — R7989 Other specified abnormal findings of blood chemistry: Secondary | ICD-10-CM

## 2014-02-01 DIAGNOSIS — D509 Iron deficiency anemia, unspecified: Secondary | ICD-10-CM

## 2014-02-01 LAB — CBC
HCT: 35 % — ABNORMAL LOW (ref 36.0–46.0)
Hemoglobin: 11.4 g/dL — ABNORMAL LOW (ref 12.0–15.0)
MCH: 26.1 pg (ref 26.0–34.0)
MCHC: 32.6 g/dL (ref 30.0–36.0)
MCV: 80.3 fL (ref 78.0–100.0)
Platelets: 347 10*3/uL (ref 150–400)
RBC: 4.36 MIL/uL (ref 3.87–5.11)
RDW: 13.6 % (ref 11.5–15.5)
WBC: 8.1 10*3/uL (ref 4.0–10.5)

## 2014-02-01 NOTE — Progress Notes (Signed)
  Sheila [name] is a 23 y.o. who presents for a postpartum visit. She is 6 weeks postpartum following a low cervical transverse Cesarean section. I have fully reviewed the prenatal and intrapartum course. The delivery was at 36.2 gestational weeks. She had PROM, then arrest of labor with baby OP with compound presentation plus chorio.  Her creatine was elevated postpartum with normal renal US and UOP.  Deemed to be dt lasix .Anesthesia: epidural. Postpartum course has been complicated by elevated creatinine immediately postpartum, deemed to be r/t Lasix.  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  She did not start it because she feels like she doesn't need it.  Doing OK, just "a little stressed" and crying less. Baby's course has been uneventful after a brief NICU stay for chorio. Baby is feeding by breast and bottle (pumping and mixing with formula). Bleeding: no bleeding. Bowel function is normal. Bladder function is normal. Patient is not sexually active. Contraception method is abstinence. Postpartum depression screening: equivocal at 12.    Review of Systems   Constitutional: Negative for fever and chills Eyes: Negative for visual disturbances Respiratory: Negative for shortness of breath, dyspnea Cardiovascular: Negative for chest pain or palpitations  Gastrointestinal: Negative for vomiting, diarrhea and constipation Genitourinary: Negative for dysuria and urgency Musculoskeletal: Negative for back pain, joint pain, myalgias  Neurological: Negative for dizziness and headaches   Objective:    There were no vitals filed for this visit. General:  alert, cooperative and no distress   Breasts:  negative  Lungs: clear to auscultation bilaterally  Heart:  regular rate and rhythm  Abdomen: Soft, nontender; incision well healed   Vulva:  normal  Vagina: normal vagina  Cervix:  closed  Corpus: Well involuted     Rectal Exam: no hemorrhoids        Assessment:     normal postpartum exam.  Plan:    1. Contraception: abstinence ([name] in prison) 2. CMET today to check creatinine and CBC d/t anemia in pregnancy and postpartum 2. Follow up in:   as needed.

## 2014-02-05 ENCOUNTER — Encounter: Payer: Self-pay | Admitting: Advanced Practice Midwife

## 2014-02-05 NOTE — Progress Notes (Signed)
  CMP cancelled in error.  Pt will come by to check CMP with dx: renal insufficiency (probably Lasix induced in hospital)

## 2014-04-02 ENCOUNTER — Encounter: Payer: Self-pay | Admitting: Advanced Practice Midwife

## 2014-10-17 ENCOUNTER — Ambulatory Visit (INDEPENDENT_AMBULATORY_CARE_PROVIDER_SITE_OTHER): Payer: Medicaid Other | Admitting: Advanced Practice Midwife

## 2014-10-17 VITALS — BP 94/50 | HR 100 | Ht 62.0 in | Wt 115.0 lb

## 2014-10-17 DIAGNOSIS — Z30011 Encounter for initial prescription of contraceptive pills: Secondary | ICD-10-CM | POA: Diagnosis not present

## 2014-10-17 DIAGNOSIS — Z3202 Encounter for pregnancy test, result negative: Secondary | ICD-10-CM | POA: Diagnosis not present

## 2014-10-17 LAB — POCT URINE PREGNANCY: Preg Test, Ur: NEGATIVE

## 2014-10-17 MED ORDER — NORETHIN-ETH ESTRAD-FE BIPHAS 1 MG-10 MCG / 10 MCG PO TABS: 1.0000 | ORAL_TABLET | Freq: Every day | ORAL | Status: DC

## 2014-10-17 NOTE — Progress Notes (Signed)
[facility] Visit  Patient name: Sheila NippleChynna L Boulais MRN [id number]  Date of birth: 11/11/1990  CC & HPI:  Sheila [name] is a 24 y.o. Caucasian female presenting today for initiation of birth control.  Her [name] is getting out of prison in a month, so she wants to go ahead and start COC's.  Other options discussed.  She has been on LoLoestrin in the past and likes it.   Pertinent History Reviewed:  Medical & Surgical Hx:   Past Medical History  Diagnosis Date  . Medical history non-contributory   . HSV-2 infection   . Nosebleed 09/06/2013  . Round ligament pain 09/06/2013   Past Surgical History  Procedure Laterality Date  . Bilateral breast implants  12/2011    . Tonsillectomy    . Cesarean section N/A 12/21/2013    Procedure: CESAREAN SECTION;  Surgeon: Lazaro ArmsLuther H Eure, MD;  Location: [facility];  Service: Obstetrics;  Laterality: N/A;   Family History  Problem Relation Age of Onset  . Cancer Maternal Grandmother   . Cancer Maternal Grandfather     Current outpatient prescriptions:  .  cyclobenzaprine (FLEXERIL) 5 MG tablet, Take 1 tablet (5 mg total) by mouth 3 (three) times daily as needed for muscle spasms. (Patient not taking: Reported on 10/17/2014), Disp: 30 tablet, Rfl: 1 .  FENUGREEK PO, Take by mouth., Disp: , Rfl:  .  ibuprofen (ADVIL,MOTRIN) 800 MG tablet, Take 1 tablet (800 mg total) by mouth every 8 (eight) hours as needed. (Patient not taking: Reported on 10/17/2014), Disp: 30 tablet, Rfl: 0 .  Iron-FA-B Cmp-C-Biot-Probiotic (FUSION PLUS) CAPS, Take 1 capsule by mouth See admin instructions. 1 time daily between meals (Patient not taking: Reported on 10/17/2014), Disp: 30 capsule, Rfl: 6 .  Norethindrone-Ethinyl Estradiol-Fe Biphas (LO LOESTRIN FE) 1 MG-10 MCG / 10 MCG tablet, Take 1 tablet by mouth daily., Disp: 1 Package, Rfl: 11 .  Prenat-FeCbn-FeAspGl-FA-Omega (OB COMPLETE PETITE) 35-5-1-200 MG CAPS, Take 1 tablet by mouth daily. (Patient not taking: Reported on  10/17/2014), Disp: 30 capsule, Rfl: 11 .  valACYclovir (VALTREX) 1000 MG tablet, Take 1 tablet (1,000 mg total) by mouth daily. (Patient not taking: Reported on 10/17/2014), Disp: 30 tablet, Rfl: 3 Social History: Reviewed -  reports that she has never smoked. She has never used smokeless tobacco.  Review of Systems:   Constitutional: Negative for fever and chills Eyes: Negative for visual disturbances Respiratory: Negative for shortness of breath, dyspnea Cardiovascular: Negative for chest pain or palpitations  Gastrointestinal: Negative for vomiting, diarrhea and constipation; no abdominal pain Genitourinary: Negative for dysuria and urgency, vaginal irritation or itching Musculoskeletal: Negative for back pain, joint pain, myalgias  Neurological: Negative for dizziness and headaches    Objective Findings:  Vitals: BP 94/50 mmHg  Pulse 100  Ht 5\' 2"  (1.575 m)  Wt 115 lb (52.164 kg)  BMI 21.03 kg/m2  LMP 09/27/2014  Physical Examination: General appearance - alert, well appearing, and in no distress Mental status - normal mood, behavior, speech, dress, motor activity, and thought processes Chest - respiratory effort noraml  Results for orders placed or performed in visit on 10/17/14 (from the past 24 hour(s))  POCT urine pregnancy   Collection Time: 10/17/14  2:56 PM  Result Value Ref Range   Preg Test, Ur Negative        Assessment & Plan:  A:   Contraception management P:  Rx Lo LOestrin. Start this week when she starts her period  F/U prn problems  CRESENZO-DISHMAN,Timotheus Salm CNM 10/17/2014 3:27 PM

## 2014-11-07 ENCOUNTER — Encounter: Payer: Self-pay | Admitting: Adult Health

## 2014-11-07 ENCOUNTER — Ambulatory Visit (INDEPENDENT_AMBULATORY_CARE_PROVIDER_SITE_OTHER): Payer: Medicaid Other | Admitting: Adult Health

## 2014-11-07 VITALS — BP 112/70 | HR 84 | Ht 63.0 in | Wt 109.0 lb

## 2014-11-07 DIAGNOSIS — R309 Painful micturition, unspecified: Secondary | ICD-10-CM | POA: Diagnosis not present

## 2014-11-07 DIAGNOSIS — R319 Hematuria, unspecified: Secondary | ICD-10-CM

## 2014-11-07 DIAGNOSIS — B009 Herpesviral infection, unspecified: Secondary | ICD-10-CM | POA: Diagnosis not present

## 2014-11-07 HISTORY — DX: Herpesviral infection, unspecified: B00.9

## 2014-11-07 HISTORY — DX: Hematuria, unspecified: R31.9

## 2014-11-07 HISTORY — DX: Painful micturition, unspecified: R30.9

## 2014-11-07 LAB — POCT URINALYSIS DIPSTICK
Glucose, UA: NEGATIVE
Nitrite, UA: NEGATIVE
Protein, UA: NEGATIVE

## 2014-11-07 MED ORDER — VALACYCLOVIR HCL 1 G PO TABS: 1000.0000 mg | ORAL_TABLET | Freq: Two times a day (BID) | ORAL | Status: DC

## 2014-11-07 MED ORDER — NITROFURANTOIN MONOHYD MACRO 100 MG PO CAPS: 100.0000 mg | ORAL_CAPSULE | Freq: Two times a day (BID) | ORAL | Status: DC

## 2014-11-07 NOTE — Progress Notes (Signed)
Subjective:     Patient ID: Sheila [name], female   DOB: 10/03/1990, 24 y.o.   MRN: [id number]  HPI Sheila [name] is a 24 year old white female in complaining of pain with urination and has itchy feeling.Had not had sex in a year and had sex Saturday, then this started.  Review of Systems Patient denies any headaches, hearing loss, fatigue, blurred vision, shortness of breath, chest pain, abdominal pain, problems with bowel movements,  or intercourse. No joint pain or mood swings. See HPI for positives. Reviewed past medical,surgical, social and family history. Reviewed medications and allergies.     Objective:   Physical Exam BP 112/70 mmHg  Pulse 84  Ht 5\' 3"  (1.6 m)  Wt 109 lb (49.442 kg)  BMI 19.31 kg/m2  LMP 11/03/2014  Breastfeeding? No urine 2+ leuks and 3+ blood cloudy with odor, Skin warm and dry.Pelvic: external genitalia is normal in appearance, but on inner labia on left at base and inner introitus is vesicle and ulcerated area, she says she has herpes but has never had here before, too tender to do speculum exam.    Assessment:     Pain with urination Hematuria Herpes     Plan:     Rx valtrex 1 gm take 1 bid x 10 days #20 with 2 refills Rx macrobid 1 bid x 7 days #14  UA C&S sent Increase fluids No sex Review handout on herpes and UTI

## 2014-11-07 NOTE — Patient Instructions (Signed)
Push fluids Take valtrex and macrobid no sex Urinary Tract Infection Urinary tract infections (UTIs) can develop anywhere along your urinary tract. Your urinary tract is your body's drainage system for removing wastes and extra water. Your urinary tract includes two kidneys, two ureters, a bladder, and a urethra. Your kidneys are a pair of bean-shaped organs. Each kidney is about the size of your fist. They are located below your ribs, one on each side of your spine. CAUSES Infections are caused by microbes, which are microscopic organisms, including fungi, viruses, and bacteria. These organisms are so small that they can only be seen through a microscope. Bacteria are the microbes that most commonly cause UTIs. SYMPTOMS  Symptoms of UTIs may vary by age and gender of the patient and by the location of the infection. Symptoms in young women typically include a frequent and intense urge to urinate and a painful, burning feeling in the bladder or urethra during urination. Older women and men are more likely to be tired, shaky, and weak and have muscle aches and abdominal pain. A fever may mean the infection is in your kidneys. Other symptoms of a kidney infection include pain in your back or sides below the ribs, nausea, and vomiting. DIAGNOSIS To diagnose a UTI, your caregiver will ask you about your symptoms. Your caregiver also will ask to provide a urine sample. The urine sample will be tested for bacteria and white blood cells. White blood cells are made by your body to help fight infection. TREATMENT  Typically, UTIs can be treated with medication. Because most UTIs are caused by a bacterial infection, they usually can be treated with the use of antibiotics. The choice of antibiotic and length of treatment depend on your symptoms and the type of bacteria causing your infection. HOME CARE INSTRUCTIONS  If you were prescribed antibiotics, take them exactly as your caregiver instructs you. Finish the  medication even if you feel better after you have only taken some of the medication.  Drink enough water and fluids to keep your urine clear or pale yellow.  Avoid caffeine, tea, and carbonated beverages. They tend to irritate your bladder.  Empty your bladder often. Avoid holding urine for long periods of time.  Empty your bladder before and after sexual intercourse.  After a bowel movement, women should cleanse from front to back. Use each tissue only once. SEEK MEDICAL CARE IF:   You have back pain.  You develop a fever.  Your symptoms do not begin to resolve within 3 days. SEEK IMMEDIATE MEDICAL CARE IF:   You have severe back pain or lower abdominal pain.  You develop chills.  You have nausea or vomiting.  You have continued burning or discomfort with urination. MAKE SURE YOU:   Understand these instructions.  Will watch your condition.  Will get help right away if you are not doing well or get worse. Document Released: 02/25/2005 Document Revised: 11/17/2011 Document Reviewed: 06/26/2011 [facility] Patient Information 2015 [place]. This information is not intended to replace advice given to you by your health care provider. Make sure you discuss any questions you have with your health care provider. Genital Herpes Genital herpes is a sexually transmitted disease. This means that it is a disease passed by having sex with an infected person. There is no cure for genital herpes. The time between attacks can be months to years. The virus may live in a person but produce no problems (symptoms). This infection can be passed to a  baby as it travels down the birth canal (vagina). In a newborn, this can cause central nervous system damage, eye damage, or even death. The virus that causes genital herpes is usually HSV-2 virus. The virus that causes oral herpes is usually HSV-1. The diagnosis (learning what is wrong) is made through culture results. SYMPTOMS  Usually symptoms  of pain and itching begin a few days to a week after contact. It first appears as small blisters that progress to small painful ulcers which then scab over and heal after several days. It affects the outer genitalia, birth canal, cervix, penis, anal area, buttocks, and thighs. HOME CARE INSTRUCTIONS   Keep ulcerated areas dry and clean.  Take medications as directed. Antiviral medications can speed up healing. They will not prevent recurrences or cure this infection. These medications can also be taken for suppression if there are frequent recurrences.  While the infection is active, it is contagious. Avoid all sexual contact during active infections.  Condoms may help prevent spread of the herpes virus.  Practice safe sex.  Wash your hands thoroughly after touching the genital area.  Avoid touching your eyes after touching your genital area.  Inform your caregiver if you have had genital herpes and become pregnant. It is your responsibility to insure a safe outcome for your baby in this pregnancy.  Only take over-the-counter or prescription medicines for pain, discomfort, or fever as directed by your caregiver. SEEK MEDICAL CARE IF:   You have a recurrence of this infection.  You do not respond to medications and are not improving.  You have new sources of pain or discharge which have changed from the original infection.  You have an oral temperature above 102 F (38.9 C).  You develop abdominal pain.  You develop eye pain or signs of eye infection. Document Released: 05/15/2000 Document Revised: 08/10/2011 Document Reviewed: 06/05/2009 [facility] Patient Information 2015 [place], [place]. This information is not intended to replace advice given to you by your health care provider. Make sure you discuss any questions you have with your health care provider.

## 2014-11-08 LAB — MICROSCOPIC EXAMINATION: CASTS: NONE SEEN /LPF

## 2014-11-08 LAB — URINALYSIS, ROUTINE W REFLEX MICROSCOPIC
BILIRUBIN UA: NEGATIVE
Glucose, UA: NEGATIVE
Ketones, UA: NEGATIVE
Nitrite, UA: NEGATIVE
PH UA: 6 (ref 5.0–7.5)
Specific Gravity, UA: 1.026 (ref 1.005–1.030)
Urobilinogen, Ur: 1 mg/dL (ref 0.2–1.0)

## 2014-11-08 LAB — URINE CULTURE

## 2015-03-27 ENCOUNTER — Other Ambulatory Visit: Payer: Self-pay | Admitting: Advanced Practice Midwife

## 2015-09-02 IMAGING — US US RENAL
1 series · 14 of 25 positions shown · non-contrast
Comparison: None.

CLINICAL DATA: One day postop from C-section delivery. Elevated
creatinine and edema. Patient has a uterine anomaly with didelphys.

EXAM:
RENAL/URINARY TRACT ULTRASOUND COMPLETE

[Series 1: us renal · 14 of 52 slices shown]
[im 1/52]
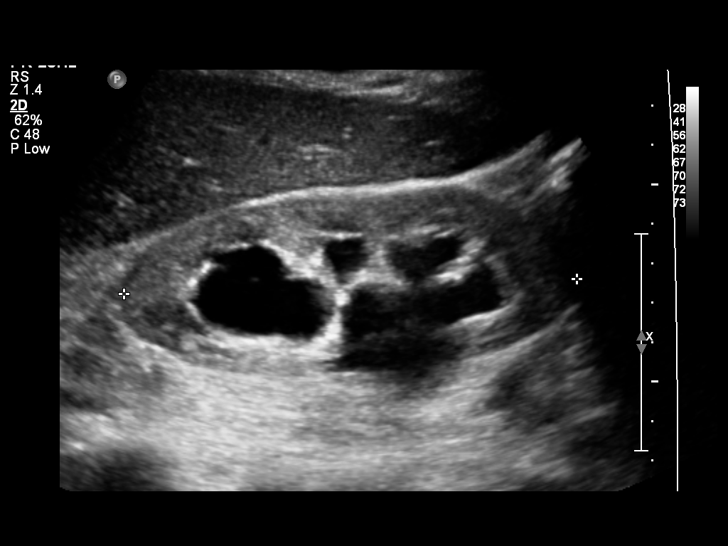
[im 5/52]
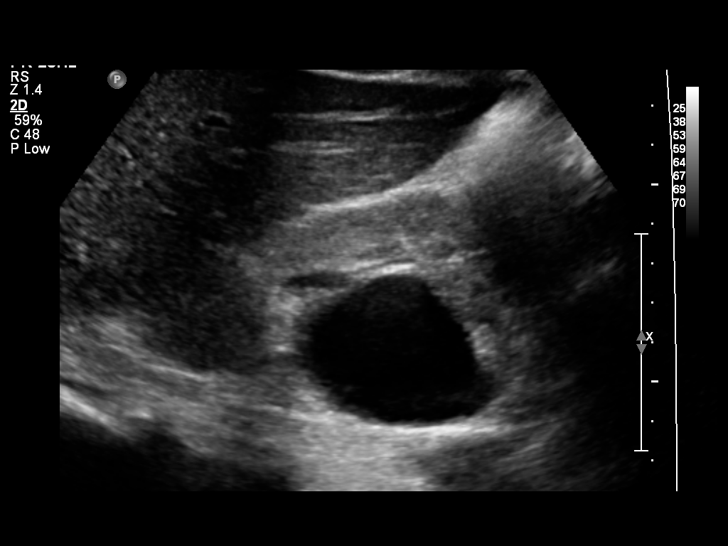
[im 9/52]
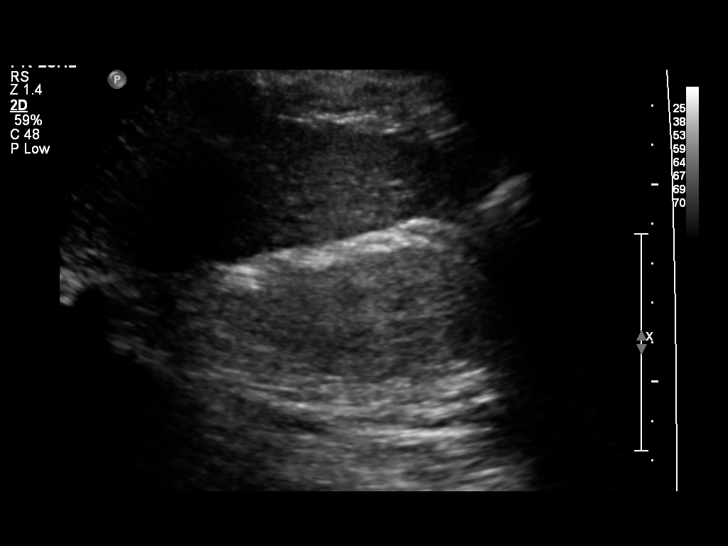
[im 13/52]
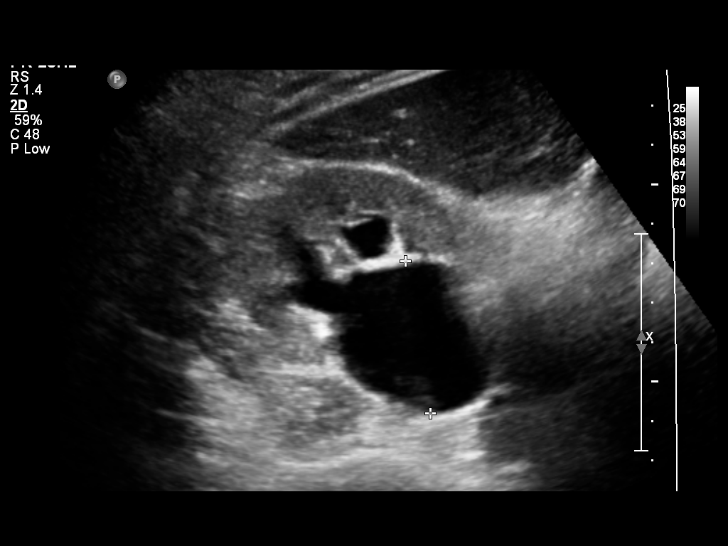
[im 18/52]
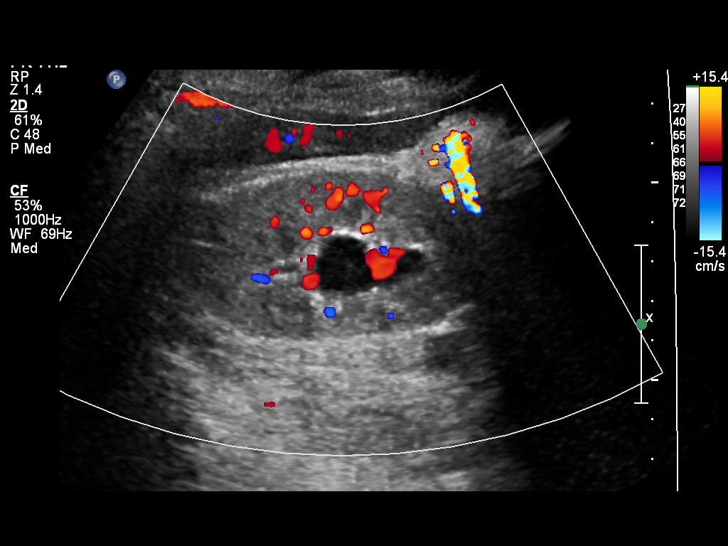
[im 20/52]
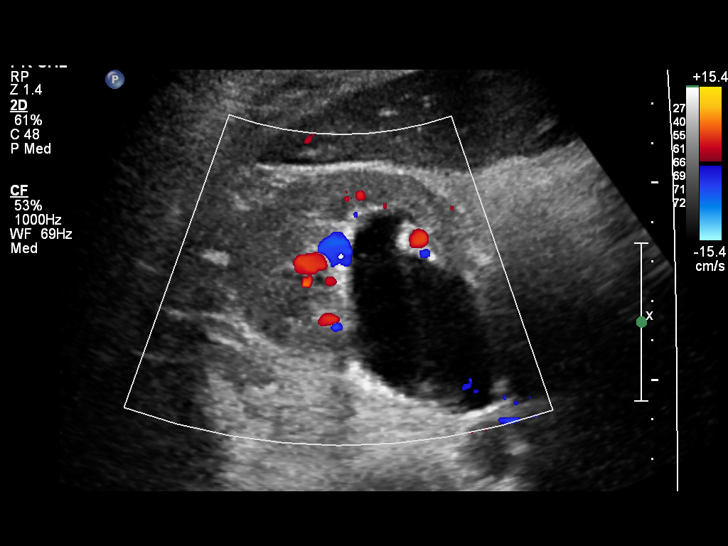
[im 24/52]
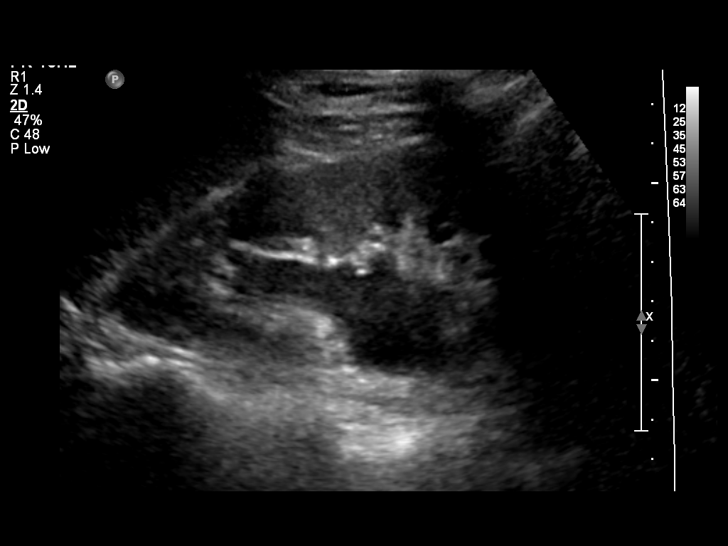
[im 28/52]
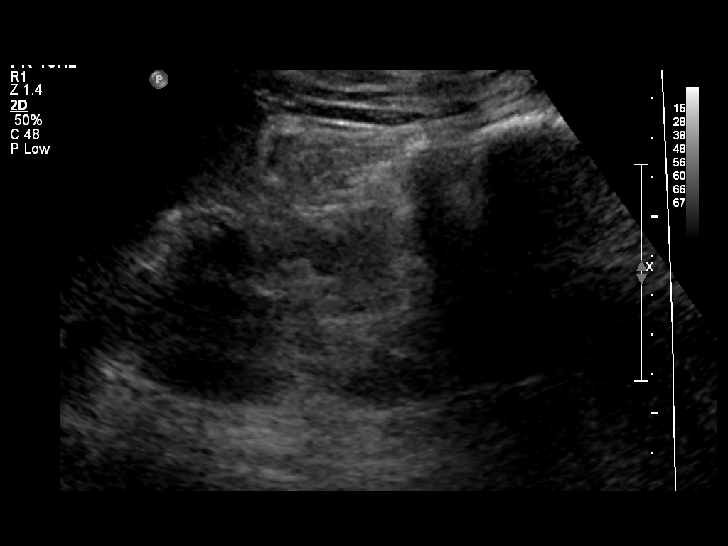
[im 32/52]
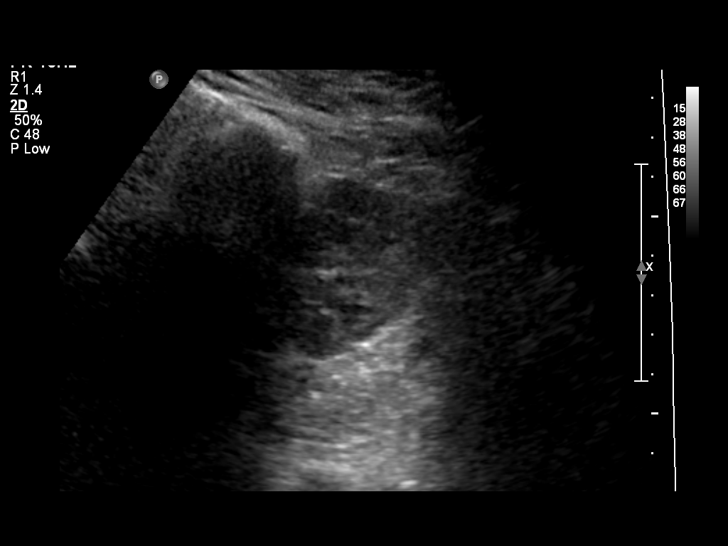
[im 35/52]
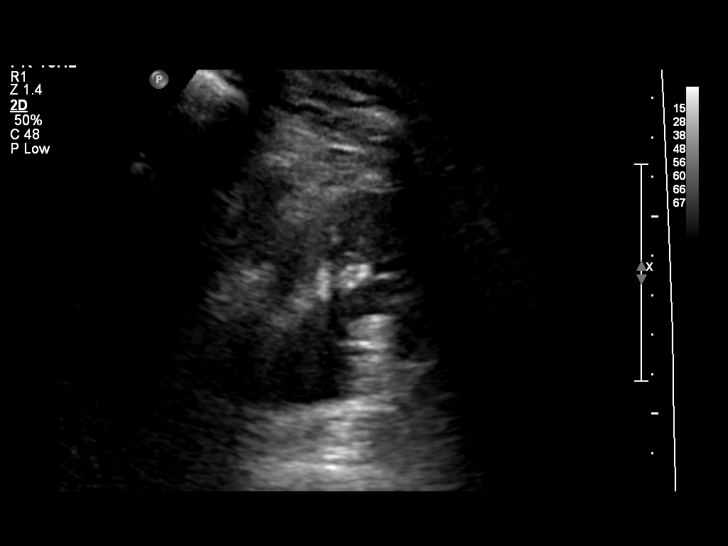
[im 39/52]
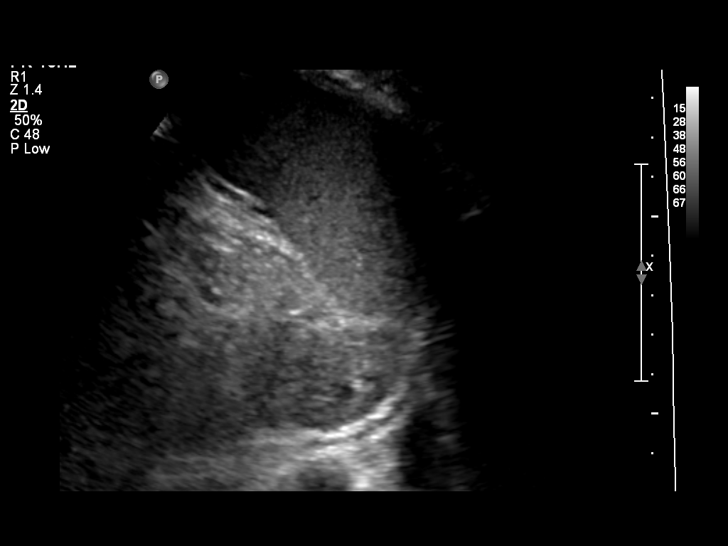
[im 43/52]
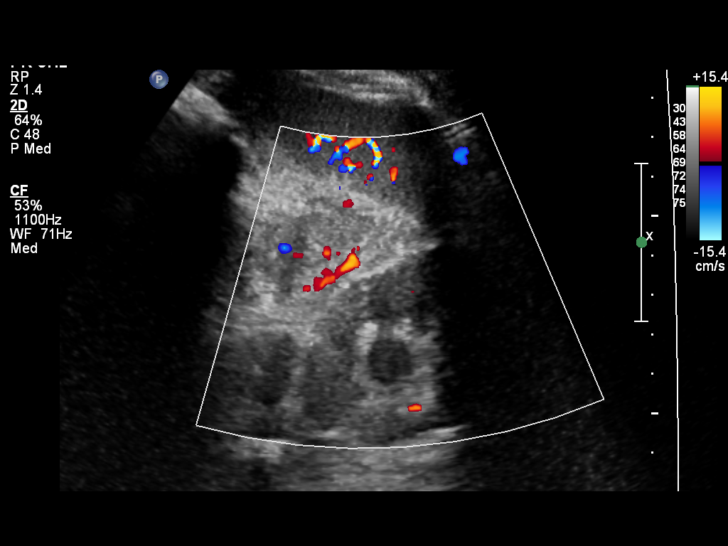
[im 47/52]
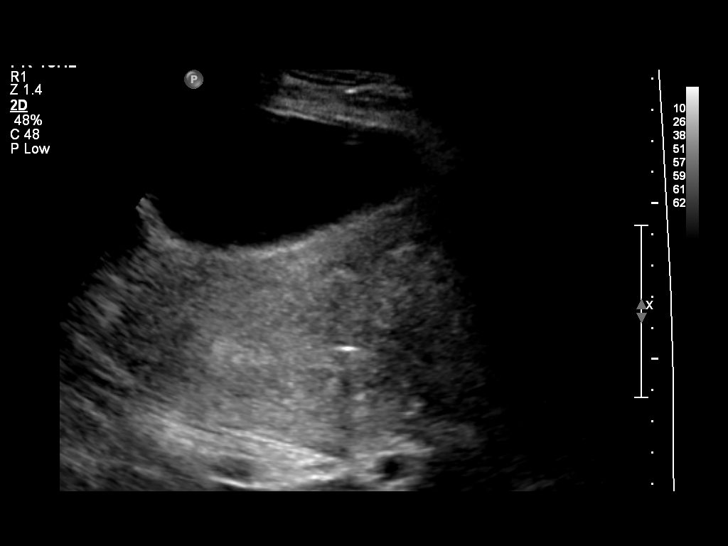
[im 52/52]
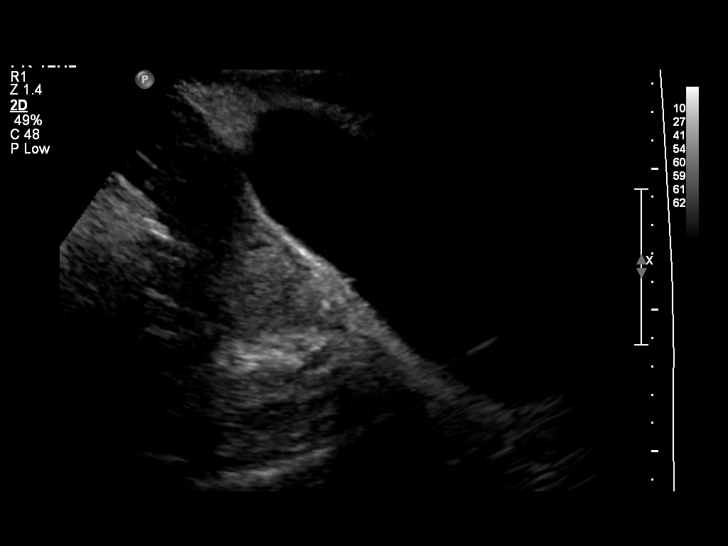

[14 of 25 positions shown; findings below may reference images not displayed]

FINDINGS: Right Kidney:

Length: 11.4 cm. Moderate hydronephrosis. Diffuse increased renal
parenchymal echogenicity. No renal mass or stone.

Left Kidney:

Length: 10.9 cm. Echogenicity is borderline increased. Moderate
hydronephrosis, less prominent than seen on the right. No mass or
stone.

Bladder:

Appears normal for degree of bladder distention. Neither ureteral
jet was visualized.

Neither ureter could be imaged.
IMPRESSION: 1. Moderate bilateral hydronephrosis, greater on the right. The
etiology of this is unclear. No renal stone is seen. Ureters were
not visualized. Neither ureteral jet was imaged in the bladder.
Imaging of the bladder was technically limited due to patient's
pelvic discomfort.
2. Increased parenchymal echogenicity on the right with borderline
increased parenchymal echogenicity on the left. This suggests
medical renal disease.
3. No other abnormality.

## 2016-02-12 ENCOUNTER — Encounter: Payer: Self-pay | Admitting: Adult Health

## 2016-02-12 ENCOUNTER — Ambulatory Visit (INDEPENDENT_AMBULATORY_CARE_PROVIDER_SITE_OTHER): Payer: Medicaid Other | Admitting: Adult Health

## 2016-02-12 VITALS — BP 90/50 | HR 102 | Ht 63.0 in | Wt 110.0 lb

## 2016-02-12 DIAGNOSIS — O3680X0 Pregnancy with inconclusive fetal viability, not applicable or unspecified: Secondary | ICD-10-CM

## 2016-02-12 DIAGNOSIS — N926 Irregular menstruation, unspecified: Secondary | ICD-10-CM | POA: Diagnosis not present

## 2016-02-12 DIAGNOSIS — Z3201 Encounter for pregnancy test, result positive: Secondary | ICD-10-CM | POA: Diagnosis not present

## 2016-02-12 DIAGNOSIS — Z3490 Encounter for supervision of normal pregnancy, unspecified, unspecified trimester: Secondary | ICD-10-CM

## 2016-02-12 LAB — POCT URINE PREGNANCY: PREG TEST UR: POSITIVE — AB

## 2016-02-12 MED ORDER — PRENATAL PLUS 27-1 MG PO TABS: 1.0000 | ORAL_TABLET | Freq: Every day | ORAL | 11 refills | Status: DC

## 2016-02-12 NOTE — Progress Notes (Signed)
Subjective:     Patient ID: Sheila [name], female   DOB: 09/16/1990, 25 y.o.   MRN: [id number]  HPI Sheila [name] is a 25 year old white female in for UPT, she has missed a period and has had 3+HPT just  3 days ago,she denies any bleeding or nausea.   Review of Systems +missed period, with +HPT Reviewed past medical,surgical, social and family history. Reviewed medications and allergies.     Objective:   Physical Exam BP (!) 90/50 (BP Location: Left Arm, Patient Position: Sitting, Cuff Size: Normal)   Pulse (!) 102   Ht 5\' 3"  (1.6 m)   Wt 110 lb (49.9 kg)   LMP 12/24/2015   BMI 19.49 kg/m UPT +, about 7+1 week by LMP with EDD 09/29/16, Skin warm and dry. Neck: mid line trachea, normal thyroid, good ROM, no lymphadenopathy noted. Lungs: clear to ausculation bilaterally. Cardiovascular: regular rate and rhythm.Abdomen is soft and non tender, medicaid form given.    Assessment:     1. Pregnancy examination or test, positive result   2. Pregnant   3. Pregnancy with inconclusive fetal viability, not applicable or unspecified fetus       Plan:     Rx prenatal plus #30 take 1 daily with 11 refills Return in 1 week for dating US Increase fluids and eat often  Review handout on first trimester

## 2016-02-12 NOTE — Patient Instructions (Signed)
First Trimester of Pregnancy The first trimester of pregnancy is from week 1 until the end of week 12 (months 1 through 3). A week after a sperm fertilizes an egg, the egg will implant on the wall of the uterus. This embryo will begin to develop into a baby. Genes from you and your partner are forming the baby. The female genes determine whether the baby is a boy or a girl. At 6-8 weeks, the eyes and face are formed, and the heartbeat can be seen on ultrasound. At the end of 12 weeks, all the baby's organs are formed.  Now that you are pregnant, you will want to do everything you can to have a healthy baby. Two of the most important things are to get good prenatal care and to follow your health care provider's instructions. Prenatal care is all the medical care you receive before the baby's birth. This care will help prevent, find, and treat any problems during the pregnancy and childbirth. BODY CHANGES Your body goes through many changes during pregnancy. The changes vary from woman to woman.   You may gain or lose a couple of pounds at first.  You may feel sick to your stomach (nauseous) and throw up (vomit). If the vomiting is uncontrollable, call your health care provider.  You may tire easily.  You may develop headaches that can be relieved by medicines approved by your health care provider.  You may urinate more often. Painful urination may mean you have a bladder infection.  You may develop heartburn as a result of your pregnancy.  You may develop constipation because certain hormones are causing the muscles that push waste through your intestines to slow down.  You may develop hemorrhoids or swollen, bulging veins (varicose veins).  Your breasts may begin to grow larger and become tender. Your nipples may stick out more, and the tissue that surrounds them (areola) may become darker.  Your gums may bleed and may be sensitive to brushing and flossing.  Dark spots or blotches (chloasma,  mask of pregnancy) may develop on your face. This will likely fade after the baby is born.  Your menstrual periods will stop.  You may have a loss of appetite.  You may develop cravings for certain kinds of food.  You may have changes in your emotions from day to day, such as being excited to be pregnant or being concerned that something may go wrong with the pregnancy and baby.  You may have more vivid and strange dreams.  You may have changes in your hair. These can include thickening of your hair, rapid growth, and changes in texture. Some women also have hair loss during or after pregnancy, or hair that feels dry or thin. Your hair will most likely return to normal after your baby is born. WHAT TO EXPECT AT YOUR PRENATAL VISITS During a routine prenatal visit:  You will be weighed to make sure you and the baby are growing normally.  Your blood pressure will be taken.  Your abdomen will be measured to track your baby's growth.  The fetal heartbeat will be listened to starting around week 10 or 12 of your pregnancy.  Test results from any previous visits will be discussed. Your health care provider may ask you:  How you are feeling.  If you are feeling the baby move.  If you have had any abnormal symptoms, such as leaking fluid, bleeding, severe headaches, or abdominal cramping.  If you are using any tobacco products,   including cigarettes, chewing tobacco, and electronic cigarettes.  If you have any questions. Other tests that may be performed during your first trimester include:  Blood tests to find your blood type and to check for the presence of any previous infections. They will also be used to check for low iron levels (anemia) and Rh antibodies. Later in the pregnancy, blood tests for diabetes will be done along with other tests if problems develop.  Urine tests to check for infections, diabetes, or protein in the urine.  An ultrasound to confirm the proper growth  and development of the baby.  An amniocentesis to check for possible genetic problems.  Fetal screens for spina bifida and Down syndrome.  You may need other tests to make sure you and the baby are doing well.  HIV (human immunodeficiency virus) testing. Routine prenatal testing includes screening for HIV, unless you choose not to have this test. HOME CARE INSTRUCTIONS  Medicines  Follow your health care provider's instructions regarding medicine use. Specific medicines may be either safe or unsafe to take during pregnancy.  Take your prenatal vitamins as directed.  If you develop constipation, try taking a stool softener if your health care provider approves. Diet  Eat regular, well-balanced meals. Choose a variety of foods, such as meat or vegetable-based protein, fish, milk and low-fat dairy products, vegetables, fruits, and whole grain breads and cereals. Your health care provider will help you determine the amount of weight gain that is right for you.  Avoid raw meat and uncooked cheese. These carry germs that can cause birth defects in the baby.  Eating four or five small meals rather than three large meals a day may help relieve nausea and vomiting. If you start to feel nauseous, eating a few soda crackers can be helpful. Drinking liquids between meals instead of during meals also seems to help nausea and vomiting.  If you develop constipation, eat more high-fiber foods, such as fresh vegetables or fruit and whole grains. Drink enough fluids to keep your urine clear or pale yellow. Activity and Exercise  Exercise only as directed by your health care provider. Exercising will help you:  Control your weight.  Stay in shape.  Be prepared for labor and delivery.  Experiencing pain or cramping in the lower abdomen or low back is a good sign that you should stop exercising. Check with your health care provider before continuing normal exercises.  Try to avoid standing for long  periods of time. Move your legs often if you must stand in one place for a long time.  Avoid heavy lifting.  Wear low-heeled shoes, and practice good posture.  You may continue to have sex unless your health care provider directs you otherwise. Relief of Pain or Discomfort  Wear a good support bra for breast tenderness.   Take warm sitz baths to soothe any pain or discomfort caused by hemorrhoids. Use hemorrhoid cream if your health care provider approves.   Rest with your legs elevated if you have leg cramps or low back pain.  If you develop varicose veins in your legs, wear support hose. Elevate your feet for 15 minutes, 3-4 times a day. Limit salt in your diet. Prenatal Care  Schedule your prenatal visits by the twelfth week of pregnancy. They are usually scheduled monthly at first, then more often in the last 2 months before delivery.  Write down your questions. Take them to your prenatal visits.  Keep all your prenatal visits as directed by your   health care provider. Safety  Wear your seat belt at all times when driving.  Make a list of emergency phone numbers, including numbers for family, friends, the hospital, and police and fire departments. General Tips  Ask your health care provider for a referral to a local prenatal education class. Begin classes no later than at the beginning of month 6 of your pregnancy.  Ask for help if you have counseling or nutritional needs during pregnancy. Your health care provider can offer advice or refer you to specialists for help with various needs.  Do not use hot tubs, steam rooms, or saunas.  Do not douche or use tampons or scented sanitary pads.  Do not cross your legs for long periods of time.  Avoid cat litter boxes and soil used by cats. These carry germs that can cause birth defects in the baby and possibly loss of the fetus by miscarriage or stillbirth.  Avoid all smoking, herbs, alcohol, and medicines not prescribed by  your health care provider. Chemicals in these affect the formation and growth of the baby.  Do not use any tobacco products, including cigarettes, chewing tobacco, and electronic cigarettes. If you need help quitting, ask your health care provider. You may receive counseling support and other resources to help you quit.  Schedule a dentist appointment. At home, brush your teeth with a soft toothbrush and be gentle when you floss. SEEK MEDICAL CARE IF:   You have dizziness.  You have mild pelvic cramps, pelvic pressure, or nagging pain in the abdominal area.  You have persistent nausea, vomiting, or diarrhea.  You have a bad smelling vaginal discharge.  You have pain with urination.  You notice increased swelling in your face, hands, legs, or ankles. SEEK IMMEDIATE MEDICAL CARE IF:   You have a fever.  You are leaking fluid from your vagina.  You have spotting or bleeding from your vagina.  You have severe abdominal cramping or pain.  You have rapid weight gain or loss.  You vomit blood or material that looks like coffee grounds.  You are exposed to German measles and have never had them.  You are exposed to fifth disease or chickenpox.  You develop a severe headache.  You have shortness of breath.  You have any kind of trauma, such as from a fall or a car accident.   This information is not intended to replace advice given to you by your health care provider. Make sure you discuss any questions you have with your health care provider.   Document Released: 05/12/2001 Document Revised: 06/08/2014 Document Reviewed: 03/28/2013 Elsevier Interactive Patient Education 2016 [employer] Return in 1 week for US 

## 2016-02-19 ENCOUNTER — Other Ambulatory Visit: Payer: Self-pay | Admitting: Adult Health

## 2016-02-19 ENCOUNTER — Ambulatory Visit (INDEPENDENT_AMBULATORY_CARE_PROVIDER_SITE_OTHER): Payer: Medicaid Other

## 2016-02-19 DIAGNOSIS — O3680X0 Pregnancy with inconclusive fetal viability, not applicable or unspecified: Secondary | ICD-10-CM

## 2016-02-19 DIAGNOSIS — Z3A01 Less than 8 weeks gestation of pregnancy: Secondary | ICD-10-CM

## 2016-02-19 NOTE — Patient Instructions (Signed)

## 2016-02-19 NOTE — Progress Notes (Addendum)
US 7+1 wks irregular GS w/ys,crl 4.4 mm w/no fht,uterine didelphys,IUP in right horn,normal ov's bilat,pt was seen by Drenda FreezeFran.  CRESENZO-DISHMAN,FRANCES:  Pt wants to check US next week "just to be sure".  Discussed watch and wait vs cytotec.  Info given on cytotec, along w/bleeding precautions.  Will schedule US for next week. CRESENZO-DISHMAN,FRANCES

## 2016-02-20 ENCOUNTER — Other Ambulatory Visit: Payer: Medicaid Other

## 2016-02-25 ENCOUNTER — Other Ambulatory Visit: Payer: Self-pay | Admitting: Obstetrics and Gynecology

## 2016-02-25 DIAGNOSIS — O3680X0 Pregnancy with inconclusive fetal viability, not applicable or unspecified: Secondary | ICD-10-CM

## 2016-02-26 ENCOUNTER — Ambulatory Visit: Payer: Medicaid Other

## 2016-02-28 ENCOUNTER — Ambulatory Visit (INDEPENDENT_AMBULATORY_CARE_PROVIDER_SITE_OTHER): Payer: Medicaid Other

## 2016-02-28 ENCOUNTER — Other Ambulatory Visit: Payer: Self-pay | Admitting: Obstetrics and Gynecology

## 2016-02-28 ENCOUNTER — Ambulatory Visit (INDEPENDENT_AMBULATORY_CARE_PROVIDER_SITE_OTHER): Payer: Medicaid Other | Admitting: Obstetrics and Gynecology

## 2016-02-28 DIAGNOSIS — O3680X0 Pregnancy with inconclusive fetal viability, not applicable or unspecified: Secondary | ICD-10-CM

## 2016-02-28 DIAGNOSIS — Z98891 History of uterine scar from previous surgery: Secondary | ICD-10-CM

## 2016-02-28 DIAGNOSIS — O021 Missed abortion: Secondary | ICD-10-CM | POA: Diagnosis not present

## 2016-02-28 DIAGNOSIS — Z3A01 Less than 8 weeks gestation of pregnancy: Secondary | ICD-10-CM | POA: Diagnosis not present

## 2016-02-28 DIAGNOSIS — O99019 Anemia complicating pregnancy, unspecified trimester: Secondary | ICD-10-CM

## 2016-02-28 MED ORDER — MISOPROSTOL 200 MCG PO TABS: 800.0000 ug | ORAL_TABLET | Freq: Once | ORAL | 1 refills | Status: DC

## 2016-02-28 MED ORDER — NORETHIN ACE-ETH ESTRAD-FE 1-20 MG-MCG(24) PO TABS: 1.0000 | ORAL_TABLET | Freq: Every day | ORAL | 11 refills | Status: DC

## 2016-02-28 NOTE — Progress Notes (Signed)
   [facility] Visit  02/28/2016            Patient name: Sheila [name] MRN [id number]  Date of birth: 01/29/1991  CC & HPI:  Sheila [name] is a 25 y.o. female presenting today for failed pregnancy. Pt had a transvaginal US on 02/19/16 that showed no fetal heart tones;and repeat u/s to confirm suspected dx, confirmed again today. She denies abdominal pain, spotting, and vaginal bleeding. Pt has no other acute complaints or symptoms at this time.   ROS:  ROS  Otherwise negative for acute change except as noted in the HPI.  Pertinent History Reviewed:   Reviewed:  Medical         Past Medical History:  Diagnosis Date  . Hematuria 11/07/2014  . 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 Medical history non-contributory   . Nosebleed 09/06/2013  . Pain with urination 11/07/2014  . Round ligament pain 09/06/2013                              Surgical Hx:    Past Surgical History:  Procedure Laterality Date  . Bilateral breast implants  12/2011    . CESAREAN SECTION N/A 12/21/2013   Procedure: CESAREAN SECTION;  Surgeon: Lazaro ArmsLuther H Eure, MD;  Location: [facility];  Service: Obstetrics;  Laterality: N/A;  . TONSILLECTOMY     Medications: Reviewed & Updated - see associated section                       Current Outpatient Prescriptions:  .  prenatal vitamin w/FE, FA (PRENATAL 1 + 1) 27-1 MG TABS tablet, Take 1 tablet by mouth daily at 12 noon., Disp: 30 each, Rfl: 11   Social History: Reviewed -  reports that she has quit smoking. Her smoking use included Cigarettes. She has never used smokeless tobacco.  Objective Findings:  Vitals: Last menstrual period 12/24/2015.  Physical Examination: General appearance - alert, well appearing, and in no distress and oriented to person, place, and time  Discussion only  The provider spent over 25 minutes with the visit , including previsit review, and documentation,with >than 50% spent in counseling and coordination of  care.   Assessment & Plan:   A:  1. Uterus didelphys   2. Missed AB  P:  1. Will discharge with Rx for Cytotec 800 mcg PV now and ref x 1 if no results in 1 d, begin OCP 2. Follow up in 4 weeks   By signing my name below, I, Freida Busmaniana Omoyeni, attest that this documentation has been prepared under the direction and in the presence of Tilda BurrowJohn V Milanie Rosenfield, MD . Electronically Signed: Freida Busmaniana Omoyeni, Scribe. 02/28/2016. 1:21 PM. I personally performed the services described in this documentation, which was SCRIBED in my presence. The recorded information has been reviewed and considered accurate. It has been edited as necessary during review. Tilda BurrowFERGUSON,Jakiya Bookbinder V, MD

## 2016-02-28 NOTE — Progress Notes (Signed)
US F/U  6+1 wks fetal pole,NO fht ,31.0 mm irregular GS,normal ov's bilat,uterine didelphys,IUP in right horn,pt will see Dr. Emelda FearFerguson after ultrasound

## 2016-03-25 ENCOUNTER — Telehealth: Payer: Self-pay | Admitting: *Deleted

## 2016-03-26 NOTE — Telephone Encounter (Signed)
Patient called stating she is having heavy bleeding with clots and thinks she is passing her miscarriage. She did not take her cytotec but thinks it is passing naturally. Patient informed that heavy bleeding and clots are normal to have during a miscarriage.Patient is already scheduled for appt tomorrow with Dr Emelda FearFerguson. Advised patient to keep appt. Pt verbalized understanding.

## 2016-03-27 ENCOUNTER — Ambulatory Visit: Payer: Medicaid Other | Admitting: Obstetrics and Gynecology

## 2016-04-01 ENCOUNTER — Ambulatory Visit: Payer: Medicaid Other | Admitting: Obstetrics and Gynecology

## 2016-05-13 ENCOUNTER — Other Ambulatory Visit: Payer: Self-pay | Admitting: Advanced Practice Midwife

## 2016-07-30 ENCOUNTER — Encounter (HOSPITAL_COMMUNITY): Payer: Self-pay | Admitting: *Deleted

## 2016-07-30 ENCOUNTER — Telehealth (HOSPITAL_COMMUNITY): Payer: Self-pay | Admitting: *Deleted

## 2016-07-30 ENCOUNTER — Emergency Department (HOSPITAL_COMMUNITY)
Admission: EM | Admit: 2016-07-30 | Discharge: 2016-07-30 | Disposition: A | Discharge status: Home / Self Care | Payer: Medicaid Other | Attending: Emergency Medicine | Admitting: Emergency Medicine

## 2016-07-30 DIAGNOSIS — Z87891 Personal history of nicotine dependence: Secondary | ICD-10-CM | POA: Diagnosis not present

## 2016-07-30 DIAGNOSIS — F332 Major depressive disorder, recurrent severe without psychotic features: Secondary | ICD-10-CM | POA: Diagnosis not present

## 2016-07-30 DIAGNOSIS — R45851 Suicidal ideations: Secondary | ICD-10-CM

## 2016-07-30 DIAGNOSIS — Z79899 Other long term (current) drug therapy: Secondary | ICD-10-CM | POA: Insufficient documentation

## 2016-07-30 LAB — CBC
HCT: 40.1 % (ref 36.0–46.0)
Hemoglobin: 12.4 g/dL (ref 12.0–15.0)
MCH: 26.1 pg (ref 26.0–34.0)
MCHC: 30.9 g/dL (ref 30.0–36.0)
MCV: 84.2 fL (ref 78.0–100.0)
PLATELETS: 198 10*3/uL (ref 150–400)
RBC: 4.76 MIL/uL (ref 3.87–5.11)
RDW: 12.4 % (ref 11.5–15.5)
WBC: 8.4 10*3/uL (ref 4.0–10.5)

## 2016-07-30 LAB — RAPID URINE DRUG SCREEN, HOSP PERFORMED
Amphetamines: NOT DETECTED
BARBITURATES: NOT DETECTED
Benzodiazepines: POSITIVE — AB
Cocaine: POSITIVE — AB
Opiates: NOT DETECTED
Tetrahydrocannabinol: POSITIVE — AB

## 2016-07-30 LAB — COMPREHENSIVE METABOLIC PANEL
ALK PHOS: 35 U/L — AB (ref 38–126)
ALT: 14 U/L (ref 14–54)
AST: 16 U/L (ref 15–41)
Albumin: 4.5 g/dL (ref 3.5–5.0)
Anion gap: 7 (ref 5–15)
BILIRUBIN TOTAL: 0.8 mg/dL (ref 0.3–1.2)
BUN: 7 mg/dL (ref 6–20)
CALCIUM: 9.2 mg/dL (ref 8.9–10.3)
CO2: 27 mmol/L (ref 22–32)
CREATININE: 0.64 mg/dL (ref 0.44–1.00)
Chloride: 104 mmol/L (ref 101–111)
GFR calc non Af Amer: >60 mL/min (ref >60)
Glucose, Bld: 95 mg/dL (ref 65–99)
Potassium: 3.5 mmol/L (ref 3.5–5.1)
Sodium: 138 mmol/L (ref 135–145)
Total Protein: 7.3 g/dL (ref 6.5–8.1)

## 2016-07-30 LAB — HCG, QUANTITATIVE, PREGNANCY: HCG, BETA CHAIN, QUANT, S: 1 m[IU]/mL (ref <5)

## 2016-07-30 LAB — ACETAMINOPHEN LEVEL: Acetaminophen (Tylenol), Serum: <10 ug/mL — ABNORMAL LOW (ref 10–30)

## 2016-07-30 LAB — ETHANOL

## 2016-07-30 LAB — SALICYLATE LEVEL: Salicylate Lvl: <7 mg/dL (ref 2.8–30.0)

## 2016-07-30 NOTE — Discharge Instructions (Signed)
[facility] treatment area in the next week

## 2016-07-30 NOTE — ED Triage Notes (Signed)
Security notified of pt in waiting area behind triage. Charge RN aware of SI pt and getting room ready for pt.

## 2016-07-30 NOTE — Telephone Encounter (Signed)
phone call from patient, she said she is having bad anxiety and suicidal thoughts, and depression.   When asked if she has any thoughts of harming herself, she said she has thought about it a lot, don't think she would, but she may if something bad happens.   the patient was asked to go to the emergency room for an assessment.   She agreed to go to emergency room.

## 2016-07-30 NOTE — BH Assessment (Signed)
Writer notified EDP of patient's disposition rcommended by Florina OuLorie Parks, NP to discharge home and follow up with outpatient. Dr. Estell HarpinZammit was ok with this plan as long as patient felt safe and agreed to follow up. Writer discussed recommendations with patient and she agreed.   Writer also attempted to contact patient's nurse. Writer was told that patient's nurse wasn't available and took another patient upstairs. Writer left a message with staff and requested for patient's nurse to call this writer so that he/she may be updated with patient's disposition and follow up instructions.

## 2016-07-30 NOTE — BH Assessment (Signed)
Assessment Note  Sheila [name] is an 26 y.o. female presenting to [place] with symptoms of depression, anxiety, and suicidal thoughts. Sts she called her mom whom lives in [place]. She told her mom that she wanted someone to talk to about her depression. Patient's mother gave patient a number to [facility] [place]. Patient called the office and spoke to a medical assistant Sheila [name]) and the following was noted: "Phone call from patient, she said she is having bad anxiety and suicidal thoughts, and depression. When asked if she has any thoughts of harming herself, she said she has thought about it a lot, don't think she would, but she may if something bad happens. The patient was asked to go to the emergency room for an assessment. She agreed to go to emergency room."  Writer met with patient to complete a TTS assessment. Patient admits that she has episodic depressive symptoms and suicidal thoughts. Sts that her depressive symptoms are often triggered by stress. Sts, "When I have suicidal thoughts they last for hours and then they go away like nothing ever happened". During the TTS assessment patient sts that she feels ok. She denies ever thinking of suicide plans. Sts, "I could never do such a thing" and "I scared to make an actual attempt". She feels that she is able to contract for safety today. She has no history of suicidal attempts or gestures. Sts that her stressors are often related to arguments with the father of her 2 yr child. She has a history of self mutilating "scratching and clawing arms" when angry. Sts that her mother is diagnosed with depression, anxiety, and Bipolar Disorder. Denies HI. No legal issues. No history of violent or aggressive behaviors. No AVH's. Patient does not appear to be responding to internal stimuli. Patient denies alcohol and drug use. No history of outpatient and inpatient treatment psychiatric treatment.    Diagnosis: Major Depressive  Disorder Recurrent, Severe, without psychotic features and Anxiety Disorder  Past Medical History:  Past Medical History:  Diagnosis Date  . Hematuria 11/07/2014  . 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 Medical history non-contributory   . Nosebleed 09/06/2013  . Pain with urination 11/07/2014  . Round ligament pain 09/06/2013    Past Surgical History:  Procedure Laterality Date  . Bilateral breast implants  12/2011    . CESAREAN SECTION N/A 12/21/2013   Procedure: CESAREAN SECTION;  Surgeon: Florian Buff, MD;  Location: [facility];  Service: Obstetrics;  Laterality: N/A;  . TONSILLECTOMY      Family History:  Family History  Problem Relation Age of Onset  . Cancer Maternal Grandmother   . Cancer Maternal Grandfather     Social History:  reports that she has quit smoking. Her smoking use included Cigarettes. She has never used smokeless tobacco. She reports that she does not drink alcohol or use drugs.  Additional Social History:     CIWA: CIWA-Ar BP: 130/93 Pulse Rate: 81 COWS:    Allergies: No Known Allergies  Home Medications:  (Not in a hospital admission)  OB/GYN Status:  Patient's last menstrual period was 07/02/2016.  General Assessment Data Location of Assessment: [facility] TTS Assessment: In system Is this a Tele or Face-to-Face Assessment?: Face-to-Face Is this an Initial Assessment or a Re-assessment for this encounter?: Initial Assessment Marital status: Single Maiden name:  (n/a) Is patient pregnant?: No Pregnancy Status: No Living Arrangements: Children, Other (Comment) (2 yr old child ) Can pt  return to current living arrangement?: Yes Admission Status: Voluntary Is patient capable of signing voluntary admission?: No Referral Source: Self/Family/Friend Insurance type:  (Medicaid)     Crisis Care Plan Living Arrangements: Children, Other (Comment) (2 yr old child ) Legal Guardian: Other: (no legal guardian ) Name of Psychiatrist:  (no  psychiatrist ) Name of Therapist:  (no therapist )  Education Status Is patient currently in school?: No Current Grade:  (n/a) Highest grade of school patient has completed:  (HS) Name of school:  (n/a) Contact person:  (n/a)  Risk to self with the past 6 months Suicidal Ideation: No-Not Currently/Within Last 6 Months Has patient been a risk to self within the past 6 months prior to admission? : Yes Suicidal Intent: No Has patient had any suicidal intent within the past 6 months prior to admission? : No Is patient at risk for suicide?: Yes Suicidal Plan?: No Has patient had any suicidal plan within the past 6 months prior to admission? : No Access to Means: No What has been your use of drugs/alcohol within the last 12 months?:  (denies ) Previous Attempts/Gestures: No How many times?:  (0) Other Self Harm Risks:  ("I clawed my arms before.Marland KitchenMarland KitchenI was angry") Triggers for Past Attempts: Other (Comment) (no triggers for past attempts or gestures) Intentional Self Injurious Behavior: None Family Suicide History: No Recent stressful life event(s): Other (Comment) (arguing with the father of my child) Persecutory voices/beliefs?: No Depression: Yes Depression Symptoms: Feeling angry/irritable, Feeling worthless/self pity, Loss of interest in usual pleasures, Isolating, Fatigue, Tearfulness Substance abuse history and/or treatment for substance abuse?: No Suicide prevention information given to non-admitted patients: Not applicable  Risk to Others within the past 6 months Homicidal Ideation: No Does patient have any lifetime risk of violence toward others beyond the six months prior to admission? : No Thoughts of Harm to Others: No Current Homicidal Intent: No Current Homicidal Plan: No Access to Homicidal Means: No Identified Victim:  (n/a) History of harm to others?: No Assessment of Violence: None Noted Violent Behavior Description:  (patient is calm and cooperative ) Does  patient have access to weapons?: No Criminal Charges Pending?: No Does patient have a court date: No Is patient on probation?: No  Psychosis Hallucinations: None noted Delusions: None noted  Mental Status Report Appearance/Hygiene: Disheveled Eye Contact: Good Motor Activity: Freedom of movement Speech: Logical/coherent Level of Consciousness: Alert Mood: Depressed Affect: Appropriate to circumstance Anxiety Level: None Thought Processes: Coherent, Relevant Judgement: Impaired Orientation: Person, Place, Time, Situation Obsessive Compulsive Thoughts/Behaviors: None  Cognitive Functioning Concentration: Decreased Memory: Recent Intact, Remote Intact IQ: Average Insight: Fair Impulse Control: Fair Appetite: Fair Weight Loss:  (none reported) Weight Gain:  (none reported) Sleep: Decreased Total Hours of Sleep:  (6 hrs per pnight ) Vegetative Symptoms: None  ADLScreening [facility] Assessment Services) Patient's cognitive ability adequate to safely complete daily activities?: Yes Patient able to express need for assistance with ADLs?: Yes Independently performs ADLs?: Yes (appropriate for developmental age)  Prior Inpatient Therapy Prior Inpatient Therapy: No Prior Therapy Dates:  (n/a) Prior Therapy Facilty/Provider(s):  (n/a) Reason for Treatment:  (n/a)  Prior Outpatient Therapy Prior Outpatient Therapy: No Prior Therapy Dates:  (n/a) Prior Therapy Facilty/Provider(s):  (n/a) Reason for Treatment:  (n/a) Does patient have an ACCT team?: No Does patient have Intensive In-House Services?  : No Does patient have [facility] services? : No Does patient have P4CC services?: No  ADL Screening (condition at time of admission) Patient's cognitive ability adequate  to safely complete daily activities?: Yes Is the patient deaf or have difficulty hearing?: No Does the patient have difficulty seeing, even when wearing glasses/contacts?: No Does the patient have difficulty  concentrating, remembering, or making decisions?: No Patient able to express need for assistance with ADLs?: Yes Does the patient have difficulty dressing or bathing?: No Independently performs ADLs?: Yes (appropriate for developmental age) Does the patient have difficulty walking or climbing stairs?: No Weakness of Legs: None Weakness of Arms/Hands: None  Home Assistive Devices/Equipment Home Assistive Devices/Equipment: None    Abuse/Neglect Assessment (Assessment to be complete while patient is alone) Physical Abuse: Denies Verbal Abuse: Denies Sexual Abuse: Denies Exploitation of patient/patient's resources: Denies Self-Neglect: Denies Values / Beliefs Cultural Requests During Hospitalization: None Spiritual Requests During Hospitalization: None   Advance Directives (For Healthcare) Does Patient Have a Medical Advance Directive?: No Would patient like information on creating a medical advance directive?: No - Patient declined Nutrition Screen- [place] [facility] Patient's home diet: Regular  Additional Information 1:1 In Past 12 Months?: No CIRT Risk: No Elopement Risk: No Does patient have medical clearance?: Yes     Disposition:  Disposition Initial Assessment Completed for this Encounter: Yes Disposition of Patient: Outpatient treatment, Referred to, Other dispositions (Refer to [facility] in [facility]) Type of outpatient treatment: Adult  On Site Evaluation by:   Reviewed with Physician:    Waldon Merl 07/30/2016 5:22 PM

## 2016-07-30 NOTE — ED Triage Notes (Signed)
Pt c/o suicidal thoughts, anxiety, trouble sleeping that has progressively gotten worse. Pt reports stressful home life. No plan in place at this time for how she would commit suicide. Pt has no hx of psychological problems.

## 2016-07-30 NOTE — ED Provider Notes (Addendum)
[facility] Provider Note   CSN: [id number] Arrival date & time: 07/30/16  1311     History   Chief Complaint Chief Complaint  Patient presents with  . Suicidal    HPI Sheila [name] is a 26 y.o. female.  Patient complains of being depressed and suicidal   The history is provided by the patient.  Altered Mental Status   This is a new problem. The problem has not changed since onset.Pertinent negatives include no seizures, no unresponsiveness and no hallucinations. Risk factors: Unknown. Her past medical history does not include seizures.    Past Medical History:  Diagnosis Date  . Hematuria 11/07/2014  . 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 Medical history non-contributory   . Nosebleed 09/06/2013  . Pain with urination 11/07/2014  . Round ligament pain 09/06/2013    Patient Active Problem List   Diagnosis Date Noted  . Pain with urination 11/07/2014  . Hematuria 11/07/2014  . Herpes infection 11/07/2014  . Stress and adjustment reaction 01/22/2014  . Anemia affecting pregnancy 11/13/2013  . 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 Bilateral breast implants  12/2011    . CESAREAN SECTION N/A 12/21/2013   Procedure: CESAREAN SECTION;  Surgeon: Lazaro Arms, MD;  Location: [facility];  Service: Obstetrics;  Laterality: N/A;  . TONSILLECTOMY      OB History    Gravida Para Term Preterm AB Living   2 1   1   1    SAB TAB Ectopic Multiple Live Births           1       Home Medications    Prior to Admission medications   Medication Sig Start Date End Date Taking? Authorizing Provider  LO LOESTRIN FE 1 MG-10 MCG / 10 MCG tablet TAKE 1 TABLET BY MOUTH EVERY DAY 05/13/16   Jacklyn Shell, CNM  misoprostol (CYTOTEC) 200 MCG tablet Place 4 tablets (800 mcg total) vaginally once. 02/28/16 02/28/16  Tilda Burrow, MD  Norethindrone Acetate-Ethinyl Estrad-FE  (LOESTRIN 24 FE) 1-20 MG-MCG(24) tablet Take 1 tablet by mouth daily. 02/28/16   Tilda Burrow, MD  prenatal vitamin w/FE, FA (PRENATAL 1 + 1) 27-1 MG TABS tablet Take 1 tablet by mouth daily at 12 noon. 02/12/16   Adline Potter, NP    Family History Family History  Problem Relation Age of Onset  . Cancer Maternal Grandmother   . Cancer Maternal Grandfather     Social History Social History  Substance Use Topics  . Smoking status: Former Smoker    Types: Cigarettes  . Smokeless tobacco: Never Used  . Alcohol use No     Allergies   Patient has no known allergies.   Review of Systems Review of Systems  Constitutional: Negative for appetite change and fatigue.  HENT: Negative for congestion, ear discharge and sinus pressure.   Eyes: Negative for discharge.  Respiratory: Negative for cough.   Cardiovascular: Negative for chest pain.  Gastrointestinal: Negative for abdominal pain and diarrhea.  Genitourinary: Negative for frequency and hematuria.  Musculoskeletal: Negative for back pain.  Skin: Negative for rash.  Neurological: Negative for seizures and headaches.  Psychiatric/Behavioral: Negative for hallucinations.       Depressed and suicidal     Physical Exam Updated Vital Signs BP 130/93   Pulse 81   Temp 98 F (36.7 C) (Oral)  Resp 16   Ht 5\' 2"  (1.575 m)   Wt 110 lb (49.9 kg)   LMP 07/02/2016   SpO2 100%   BMI 20.12 kg/m   Physical Exam  Constitutional: She is oriented to person, place, and time. She appears well-developed.  HENT:  Head: Normocephalic.  Eyes: Conjunctivae and EOM are normal. No scleral icterus.  Neck: Neck supple. No thyromegaly present.  Cardiovascular: Normal rate and regular rhythm.  Exam reveals no gallop and no friction rub.   No murmur heard. Pulmonary/Chest: No stridor. She has no wheezes. She has no rales. She exhibits no tenderness.  Abdominal: She exhibits no distension. There is no tenderness. There is no rebound.    Musculoskeletal: Normal range of motion. She exhibits no edema.  Lymphadenopathy:    She has no cervical adenopathy.  Neurological: She is oriented to person, place, and time. She exhibits normal muscle tone. Coordination normal.  Skin: No rash noted. No erythema.  Psychiatric:  Depressed and suicidal     ED Treatments / Results  Labs (all labs ordered are listed, but only abnormal results are displayed) Labs Reviewed  COMPREHENSIVE METABOLIC PANEL - Abnormal; Notable for the following:       Result Value   Alkaline Phosphatase 35 (*)    All other components within normal limits  ACETAMINOPHEN LEVEL - Abnormal; Notable for the following:    Acetaminophen (Tylenol), Serum <10 (*)    All other components within normal limits  RAPID URINE DRUG SCREEN, HOSP PERFORMED - Abnormal; Notable for the following:    Cocaine POSITIVE (*)    Benzodiazepines POSITIVE (*)    Tetrahydrocannabinol POSITIVE (*)    All other components within normal limits  ETHANOL  SALICYLATE LEVEL  CBC  HCG, QUANTITATIVE, PREGNANCY    EKG  EKG Interpretation None       Radiology No results found.  Procedures Procedures (including critical care time)  Medications Ordered in ED Medications - No data to display   Initial Impression / Assessment and Plan / ED Course  I have reviewed the triage vital signs and the nursing notes.  Pertinent labs & imaging results that were available during my care of the patient were reviewed by me and considered in my medical decision making (see chart for details).     Patient depressed. Patient does not meet inpatient criteria according to psychiatry. Patient will be discharged and referred to outpatient treatment  Final Clinical Impressions(s) / ED Diagnoses   Final diagnoses:  None    New Prescriptions New Prescriptions   No medications on file     Bethann BerkshireJoseph Ludie Pavlik, MD 07/30/16 1642    Bethann BerkshireJoseph Wynette Jersey, MD 07/30/16 1721

## 2016-08-21 ENCOUNTER — Ambulatory Visit (INDEPENDENT_AMBULATORY_CARE_PROVIDER_SITE_OTHER): Payer: Medicaid Other | Admitting: Adult Health

## 2016-08-21 ENCOUNTER — Encounter: Payer: Self-pay | Admitting: Adult Health

## 2016-08-21 VITALS — BP 92/64 | HR 76 | Ht 62.0 in | Wt 99.0 lb

## 2016-08-21 DIAGNOSIS — N941 Unspecified dyspareunia: Secondary | ICD-10-CM | POA: Diagnosis not present

## 2016-08-21 DIAGNOSIS — Z30013 Encounter for initial prescription of injectable contraceptive: Secondary | ICD-10-CM | POA: Diagnosis not present

## 2016-08-21 DIAGNOSIS — N898 Other specified noninflammatory disorders of vagina: Secondary | ICD-10-CM

## 2016-08-21 LAB — POCT WET PREP (WET MOUNT)
Clue Cells Wet Prep Whiff POC: NEGATIVE
WBC WET PREP: POSITIVE

## 2016-08-21 MED ORDER — MEDROXYPROGESTERONE ACETATE 150 MG/ML IM SUSP: 150.0000 mg | INTRAMUSCULAR | 4 refills | Status: DC

## 2016-08-21 NOTE — Progress Notes (Signed)
Subjective:     Patient ID: Sheila [name], female   DOB: 11/22/1990, 26 y.o.   MRN: [id number]  HPI Sheila [name] is a 26 year old white female, in complaining of pain with sex, has new partner and he is bigger and she wants to get on depo.  Review of Systems Pain with sex Reviewed past medical,surgical, social and family history. Reviewed medications and allergies.     Objective:   Physical Exam BP 92/64 (BP Location: Left Arm, Patient Position: Sitting, Cuff Size: Normal)   Pulse 76   Ht 5\' 2"  (1.575 m)   Wt 99 lb (44.9 kg)   LMP 08/13/2016 (Approximate)   Breastfeeding? No   BMI 18.11 kg/m  Skin warm and dry.Pelvic: external genitalia is normal in appearance no lesions, vagina: white discharge without odor,urethra has no lesions or masses noted, cervix:smooth, uterus: normal size, shape and contour, non tender, no masses felt, adnexa: no masses or tenderness noted. Bladder is non tender and no masses felt. Wet prep:+WBCs. GC/CHL obtained.     Assessment:     1. Dyspareunia, female   2. Vaginal discharge   3. Encounter for initial prescription of injectable contraceptive       Plan:     Meds ordered this encounter  Medications  . medroxyPROGESTERone (DEPO-PROVERA) 150 MG/ML injection    Sig: Inject 1 mL (150 mg total) into the muscle every 3 (three) months.    Dispense:  1 mL    Refill:  4    Order Specific Question:   Supervising Provider    Answer:   Lazaro ArmsEURE, LUTHER H [[id number]]  GC/CHL sent Call with period for depo Review handout on dyspareunia, change positions, increase foreplay,use good lubricate   Return in 6 weeks for pap and physical

## 2016-08-21 NOTE — Patient Instructions (Signed)
Dyspareunia, Female Dyspareunia is pain that is associated with sexual activity. This can affect any part of the genitals or lower abdomen, and there are many possible causes. This condition ranges from mild to severe. Depending on the cause, dyspareunia may get better with treatment, or it may return (recur) over time. What are the causes? The cause of this condition is not always known. Possible causes include:  Cancer.  Psychological factors, such as depression, anxiety, or previous traumatic experiences.  Severe pain and tenderness of the skin around the vagina (vulva) when it is touched (vulvar vestibulitis syndrome).  Infection of the pelvis or the vulva.  Infection of the vagina.  Painful, involuntary tightening (contraction) of the vaginal muscles when anything is put inside the vagina (vaginismus).  Allergic reaction.  Ovarian cysts.  Solid growths of tissue (tumors) in the ovaries or the uterus.  Scar tissue in the ovaries, vagina, or pelvis.  Vaginal dryness.  Thinning of the tissue (atrophy) of the vulva and vagina.  Skin conditions that affect the vulva (vulvar dermatoses), such as lichen sclerosus or lichen planus.  Endometriosis.  Tubal pregnancy.  A tilted uterus.  Uterine prolapse.  Adhesions in the vagina.  Bladder problems.  Intestinal problems.  Certain medicines.  Medical conditions such as diabetes, arthritis, or thyroid disease. What increases the risk? The following factors may make you more likely to develop this condition:  Having experienced physical or sexual trauma.  Having given birth more than once.  Taking birth control pills.  Having gone through menopause.  Having recently given birth, typically within the past 3-6 months.  Breastfeeding. What are the signs or symptoms? The main symptom of this condition is pain in any part of the genitals or lower abdomen during or after sexual activity. This may include pain during  sexual arousal, genital stimulation, or orgasm. Pain may get worse when anything is inserted into the vagina, or when the genitals are touched in any way, such as when sitting or wearing pants. Pain can range from mild to severe, depending on the cause of the condition. In some cases, symptoms go away with treatment and return (recur) at a later date. How is this diagnosed? This condition may be diagnosed based on:  Your symptoms, including:  Where your pain is located.  When your pain occurs.  Your medical history.  A physical exam. This may include a pelvic exam and a Pap test. This is a screening test that is used to check for signs of cancer of the vagina, cervix, and uterus.  Tests, including:  Blood tests.  Ultrasound. This uses sound waves to make a picture of the area that is being tested.  Urine culture. This test involves checking a urine sample for signs of infection.  Culture test. This is when your health care provider uses a swab to collect a sample of vaginal fluid. The sample is checked for signs of infection.  X-rays.  MRI.  CT scan.  Laparoscopy. This is a procedure in which a small incision is made in your lower abdomen and a lighted, pencil-sized instrument (laparoscope) is passed through the incision and used to look inside your pelvis. You may be referred to a health care provider who specializes in women's health (gynecologist). In some cases, diagnosing the cause of dyspareunia can be difficult. How is this treated? Treatment depends on the cause of your condition and your symptoms. In most cases, you may need to stop sexual activity until your symptoms improve. Treatment may   include:  Lubricants.  Kegel exercises or vaginal dilators.  Medicated skin creams.  Medicated vaginal creams.  Hormonal therapy.  Antibiotic medicine to prevent or fight infection.  Medicines that help to relieve pain.  Medicines that treat depression  (antidepressants).  Psychological counseling.  Sex therapy.  Surgery. Follow these instructions at home: Lifestyle   Avoid tight clothing and irritating materials around your genital and abdominal area.  Use water-based lubricants as needed. Avoid oil-based lubricants.  Do not use any products that irritate you. This may include certain condoms, spermicides, lubricants, soaps, tampons, vaginal sprays, or douches.  Always practice safe sex. Talk with your health care provider about which form of birth control (contraception) is best for you.  Maintain open communication with your sexual partner. General instructions   Take over-the-counter and prescription medicines only as told by your health care provider.  If you had tests done, it is your responsibility to get your tests results. Ask your health care provider or the department performing the test when your results will be ready.  Urinate before you engage in sexual activity.  Consider joining a support group.  Keep all follow-up visits as told by your health care provider. This is important. Contact a health care provider if:  You develop vaginal bleeding after sexual intercourse.  You develop a lump at the opening of your vagina. Seek medical care even if the lump is painless.  You have:  Abnormal vaginal discharge.  Vaginal dryness.  Itchiness or irritation of your vulva or vagina.  A new rash.  Symptoms that get worse or do not improve with treatment.  A fever.  Pain when you urinate.  Blood in your urine. Get help right away if:  You develop severe pain in your abdomen during or shortly after sexual intercourse.  You pass out after having sexual intercourse. This information is not intended to replace advice given to you by your health care provider. Make sure you discuss any questions you have with your health care provider. Document Released: 06/07/2007 Document Revised: 09/27/2015 Document  Reviewed: 12/18/2014 Elsevier Interactive Patient Education  2017 [employer]  

## 2016-08-23 LAB — GC/CHLAMYDIA PROBE AMP
Chlamydia trachomatis, NAA: NEGATIVE
NEISSERIA GONORRHOEAE BY PCR: NEGATIVE

## 2016-09-14 ENCOUNTER — Ambulatory Visit (INDEPENDENT_AMBULATORY_CARE_PROVIDER_SITE_OTHER): Payer: Medicaid Other | Admitting: *Deleted

## 2016-09-14 ENCOUNTER — Encounter: Payer: Self-pay | Admitting: *Deleted

## 2016-09-14 DIAGNOSIS — Z30013 Encounter for initial prescription of injectable contraceptive: Secondary | ICD-10-CM

## 2016-09-14 DIAGNOSIS — Z3042 Encounter for surveillance of injectable contraceptive: Secondary | ICD-10-CM | POA: Diagnosis not present

## 2016-09-14 DIAGNOSIS — Z3202 Encounter for pregnancy test, result negative: Secondary | ICD-10-CM | POA: Diagnosis not present

## 2016-09-14 LAB — POCT URINE PREGNANCY: PREG TEST UR: NEGATIVE

## 2016-09-14 MED ORDER — MEDROXYPROGESTERONE ACETATE 150 MG/ML IM SUSP
150.0000 mg | Freq: Once | INTRAMUSCULAR | Status: AC
  Administered 2016-09-14: 150 mg via INTRAMUSCULAR

## 2016-09-14 NOTE — Progress Notes (Signed)
Pt here for Depo injection, she started her period on Friday 4/13 and UPT was negative.  Injection given in Lt Deltoid without complication and pt instructed to return in 12 weeks for next injection.

## 2016-10-02 ENCOUNTER — Other Ambulatory Visit: Payer: Medicaid Other | Admitting: Adult Health

## 2016-12-07 ENCOUNTER — Ambulatory Visit: Payer: Medicaid Other

## 2016-12-07 ENCOUNTER — Encounter: Payer: Self-pay | Admitting: *Deleted

## 2016-12-22 ENCOUNTER — Telehealth: Payer: Self-pay | Admitting: Adult Health

## 2016-12-22 NOTE — Telephone Encounter (Signed)
Pt wants to get get on there pill, to make appt,did not get depo in July

## 2016-12-23 ENCOUNTER — Encounter: Payer: Self-pay | Admitting: Adult Health

## 2016-12-23 ENCOUNTER — Ambulatory Visit (INDEPENDENT_AMBULATORY_CARE_PROVIDER_SITE_OTHER): Payer: Medicaid Other | Admitting: Adult Health

## 2016-12-23 VITALS — BP 100/52 | HR 102 | Ht 63.0 in | Wt 105.0 lb

## 2016-12-23 DIAGNOSIS — Z3202 Encounter for pregnancy test, result negative: Secondary | ICD-10-CM | POA: Diagnosis not present

## 2016-12-23 DIAGNOSIS — Z30011 Encounter for initial prescription of contraceptive pills: Secondary | ICD-10-CM | POA: Diagnosis not present

## 2016-12-23 LAB — POCT URINE PREGNANCY: PREG TEST UR: NEGATIVE

## 2016-12-23 MED ORDER — NORETHIN-ETH ESTRAD-FE BIPHAS 1 MG-10 MCG / 10 MCG PO TABS: 1.0000 | ORAL_TABLET | Freq: Every day | ORAL | 11 refills | Status: DC

## 2016-12-23 NOTE — Progress Notes (Signed)
Subjective:     Patient ID: Sheila [name], female   DOB: 12/21/1990, 26 y.o.   MRN: [id number]  HPI [name] is a 26 year old white female in requesting to get back on OCs, use depo for 3 months and did not like it.She was due to get injection in July and did not.   Review of Systems  Patient denies any headaches, hearing loss, fatigue, blurred vision, shortness of breath, chest pain, abdominal pain, problems with bowel movements, urination, or intercourse. No joint pain or mood swings. Reviewed past medical,surgical, social and family history. Reviewed medications and allergies.     Objective:   Physical Exam BP (!) 100/52 (BP Location: Left Arm, Patient Position: Sitting, Cuff Size: Normal)   Pulse (!) 102   Ht 5\' 3"  (1.6 m)   Wt 105 lb (47.6 kg)   LMP 12/02/2016 (Approximate)   BMI 18.60 kg/m UPT negative, Skin warm and dry. Lungs: clear to ausculation bilaterally. Cardiovascular: regular rate and rhythm.   Was on lo loestrin in past will rx lo loestrin to start today.  Assessment:     1. Encounter for initial prescription of contraceptive pills   2. Pregnancy examination or test, negative result       Plan:    Use condoms x 1 pack at least Rx lo loestrin disp 1 pack take 1 daily starting today with 11 refills Follow up prn

## 2017-03-10 ENCOUNTER — Encounter (HOSPITAL_COMMUNITY): Payer: Self-pay

## 2017-03-10 ENCOUNTER — Emergency Department (HOSPITAL_COMMUNITY)
Admission: EM | Admit: 2017-03-10 | Discharge: 2017-03-10 | Disposition: A | Discharge status: Home / Self Care | Payer: Medicaid Other | Attending: Emergency Medicine | Admitting: Emergency Medicine

## 2017-03-10 DIAGNOSIS — Z87891 Personal history of nicotine dependence: Secondary | ICD-10-CM | POA: Diagnosis not present

## 2017-03-10 DIAGNOSIS — W57XXXA Bitten or stung by nonvenomous insect and other nonvenomous arthropods, initial encounter: Secondary | ICD-10-CM | POA: Insufficient documentation

## 2017-03-10 DIAGNOSIS — L089 Local infection of the skin and subcutaneous tissue, unspecified: Secondary | ICD-10-CM | POA: Insufficient documentation

## 2017-03-10 DIAGNOSIS — Z79899 Other long term (current) drug therapy: Secondary | ICD-10-CM | POA: Diagnosis not present

## 2017-03-10 DIAGNOSIS — R2242 Localized swelling, mass and lump, left lower limb: Secondary | ICD-10-CM | POA: Diagnosis present

## 2017-03-10 MED ORDER — DEXAMETHASONE 4 MG PO TABS: 4.0000 mg | ORAL_TABLET | Freq: Two times a day (BID) | ORAL | 0 refills | Status: DC

## 2017-03-10 MED ORDER — DOXYCYCLINE HYCLATE 100 MG PO CAPS: 100.0000 mg | ORAL_CAPSULE | Freq: Two times a day (BID) | ORAL | 0 refills | Status: DC

## 2017-03-10 NOTE — ED Triage Notes (Signed)
Pt reports something bit her left ankle last week.  Area is red and swollen.

## 2017-03-10 NOTE — Discharge Instructions (Signed)
Please soak your leg in a tub of warm water daily for 10-15 minutes until this has resolved. Please use Decadron and doxycycline 2 times daily with a meal. Please see your physicians at the [facility] if not improving.

## 2017-03-10 NOTE — ED Provider Notes (Signed)
[facility] Provider Note   CSN: [id number] Arrival date & time: 03/10/17  1227     History   Chief Complaint Chief Complaint  Patient presents with  . Insect Bite    HPI Sheila [name] is a 26 y.o. female.  Patient is a 25 year old female who presents to the emergency department with a complaint of a bite into her left ankle.  The patient states that sometime last week she sustained what she thinks may been insect bites or bug bites to her left ankle. She states that she had an itch and she scratched them quite a bit. She developed some hours there and now she has redness, swelling, and pain at her ankle. She's not had any fever or chills. She's not noted any purulent drainage. She states she is not diabetic. She presents now to see if they may be infected.      Past Medical History:  Diagnosis Date  . Hematuria 11/07/2014  . 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 Medical history non-contributory   . Nosebleed 09/06/2013  . Pain with urination 11/07/2014  . Round ligament pain 09/06/2013    Patient Active Problem List   Diagnosis Date Noted  . Pain with urination 11/07/2014  . Hematuria 11/07/2014  . Herpes infection 11/07/2014  . Stress and adjustment reaction 01/22/2014  . Anemia affecting pregnancy 11/13/2013  . 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 Bilateral breast implants  12/2011    . CESAREAN SECTION N/A 12/21/2013   Procedure: CESAREAN SECTION;  Surgeon: Lazaro Arms, MD;  Location: [facility];  Service: Obstetrics;  Laterality: N/A;  . TONSILLECTOMY      OB History    Gravida Para Term Preterm AB Living   SAB TAB Ectopic Multiple Live Births   1       1       Home Medications    Prior to Admission medications   Medication Sig Start Date End Date Taking? Authorizing Provider  Norethindrone-Ethinyl Estradiol-Fe Biphas (LO LOESTRIN  FE) 1 MG-10 MCG / 10 MCG tablet Take 1 tablet by mouth daily. Take 1 daily by mouth 12/23/16   Cyril Mourning A, NP  TRAZODONE HCL PO Take by mouth at bedtime.    [provider]    Family History Family History  Problem Relation Age of Onset  . Cancer Maternal Grandmother   . Cancer Maternal Grandfather     Social History Social History  Substance Use Topics  . Smoking status: Former Smoker    Types: Cigarettes  . Smokeless tobacco: Never Used  . Alcohol use No     Allergies   Patient has no known allergies.   Review of Systems Review of Systems  Constitutional: Negative for activity change.       All ROS Neg except as noted in HPI  HENT: Negative for nosebleeds.   Eyes: Negative for photophobia and discharge.  Respiratory: Negative for cough, shortness of breath and wheezing.   Cardiovascular: Negative for chest pain and palpitations.  Gastrointestinal: Negative for abdominal pain and blood in stool.  Genitourinary: Negative for dysuria, frequency and hematuria.  Musculoskeletal: Negative for arthralgias, back pain and neck pain.  Skin: Negative.   Neurological: Negative for dizziness, seizures and speech difficulty.  Psychiatric/Behavioral: Negative for confusion and hallucinations.     Physical Exam  Updated Vital Signs BP 111/65 (BP Location: Right Arm)   Pulse 93   Temp 97.7 F (36.5 C) (Oral)   Resp 18   Ht  (1.575 m)   Wt 48.7 kg (107 lb 4.8 oz)   LMP  (Approximate) Comment: irregular periods, lmp was approx 2 months ago.  SpO2 100%   BMI 19.63 kg/m   Physical Exam  Constitutional: She is oriented to person, place, and time. She appears well-developed and well-nourished.  Non-toxic appearance.  HENT:  Head: Normocephalic.  Right Ear: Tympanic membrane and external ear normal.  Left Ear: Tympanic membrane and external ear normal.  Eyes: Pupils are equal, round, and reactive to light. EOM and lids are normal.  Neck: Normal range of  motion. Neck supple. Carotid bruit is not present.  Cardiovascular: Normal rate, regular rhythm, normal heart sounds, intact distal pulses and normal pulses.   Pulmonary/Chest: Breath sounds normal. No respiratory distress.  Abdominal: Soft. Bowel sounds are normal. There is no tenderness. There is no guarding.  Musculoskeletal: Normal range of motion.  Patient has 4 raised areas with scabs present on the lateral aspect of the left ankle. She has 1 scratch mark area that is 1.1 cm long and has a scab. There is increased redness around all of these areas. The areas are warm, but not hot. There is no drainage at this time. There no red streaks noted going up the leg. The dorsalis pedis pulses 2+.  Lymphadenopathy:       Head (right side): No submandibular adenopathy present.       Head (left side): No submandibular adenopathy present.    She has no cervical adenopathy.  Neurological: She is alert and oriented to person, place, and time. She has normal strength. No cranial nerve deficit or sensory deficit.  Skin: Skin is warm and dry.  Psychiatric: She has a normal mood and affect. Her speech is normal.  Nursing note and vitals reviewed.    ED Treatments / Results  Labs (all labs ordered are listed, but only abnormal results are displayed) Labs Reviewed - No data to display  EKG  EKG Interpretation None       Radiology No results found.  Procedures Procedures (including critical care time)  Medications Ordered in ED Medications - No data to display   Initial Impression / Assessment and Plan / ED Course  I have reviewed the triage vital signs and the nursing notes.  Pertinent labs & imaging results that were available during my care of the patient were reviewed by me and considered in my medical decision making (see chart for details).       Final Clinical Impressions(s) / ED Diagnoses MDM Vital signs within normal limits. The patient has multiple areas of scratchedsores  with scabs present. There is some increased redness present at this time. No drainage at this time. The patient will be treated with antibiotic as well as short course of steroid for inflammation. I've asked patient to soak in a tub of warm Epsom salt water until this has resolved. The patient is to follow-up ith her physicians at [facility] if not improving.   Final diagnoses:  Bug bite with infection, initial encounter    New Prescriptions New Prescriptions   DEXAMETHASONE (DECADRON) 4 MG TABLET    Take 1 tablet (4 mg total) by mouth 2 (two) times daily with a meal.   DOXYCYCLINE (VIBRAMYCIN) 100 MG CAPSULE    Take 1 capsule (100 mg  total) by mouth 2 (two) times daily.     Ivery Quale, PA-C 03/10/17 1313    Donnetta Hutching, MD 03/10/17 1500

## 2017-05-31 ENCOUNTER — Ambulatory Visit (INDEPENDENT_AMBULATORY_CARE_PROVIDER_SITE_OTHER): Payer: Self-pay | Admitting: Women's Health

## 2017-05-31 ENCOUNTER — Other Ambulatory Visit: Payer: Self-pay

## 2017-05-31 ENCOUNTER — Encounter: Payer: Self-pay | Admitting: Women's Health

## 2017-05-31 VITALS — BP 118/62 | HR 108 | Ht 62.0 in | Wt 106.0 lb

## 2017-05-31 DIAGNOSIS — Z3490 Encounter for supervision of normal pregnancy, unspecified, unspecified trimester: Secondary | ICD-10-CM

## 2017-05-31 DIAGNOSIS — Z3201 Encounter for pregnancy test, result positive: Secondary | ICD-10-CM

## 2017-05-31 DIAGNOSIS — Z3491 Encounter for supervision of normal pregnancy, unspecified, first trimester: Secondary | ICD-10-CM

## 2017-05-31 LAB — POCT URINE PREGNANCY: PREG TEST UR: POSITIVE — AB

## 2017-05-31 NOTE — Patient Instructions (Addendum)
Sheila [name], I greatly value your feedback.  If you receive a survey following your visit with [place] today, we appreciate you taking the time to fill it out.  Thanks, Joellyn Haff, CNM, WHNP-BC  Begin taking a prenatal vitamin daily   Nausea & Vomiting  Have saltine crackers or pretzels by your bed and eat a few bites before you raise your head out of bed in the morning  Eat small frequent meals throughout the day instead of large meals  Drink plenty of fluids throughout the day to stay hydrated, just don't drink a lot of fluids with your meals.  This can make your stomach fill up faster making you feel sick  Do not brush your teeth right after you eat  Products with real ginger are good for nausea, like ginger ale and ginger hard candy Make sure it says made with real ginger!  Sucking on sour candy like lemon heads is also good for nausea  If your prenatal vitamins make you nauseated, take them at night so you will sleep through the nausea  Sea Bands  If you feel like you need medicine for the nausea & vomiting please let us know  If you are unable to keep any fluids or food down please let us know   Constipation  Drink plenty of fluid, preferably water, throughout the day  Eat foods high in fiber such as fruits, vegetables, and grains  Exercise, such as walking, is a good way to keep your bowels regular  Drink warm fluids, especially warm prune juice, or decaf coffee  Eat a 1/2 cup of real oatmeal (not instant), 1/2 cup applesauce, and 1/2-1 cup warm prune juice every day  If needed, you may take Colace (docusate sodium) stool softener once or twice a day to help keep the stool soft. If you are pregnant, wait until you are out of your first trimester (12-14 weeks of pregnancy)  If you still are having problems with constipation, you may take Miralax once daily as needed to help keep your bowels regular.  If you are pregnant, wait until you are out of your first trimester  (12-14 weeks of pregnancy)   First Trimester of Pregnancy The first trimester of pregnancy is from week 1 until the end of week 12 (months 1 through 3). A week after a sperm fertilizes an egg, the egg will implant on the wall of the uterus. This embryo will begin to develop into a baby. Genes from you and your partner are forming the baby. The female genes determine whether the baby is a boy or a girl. At 6-8 weeks, the eyes and face are formed, and the heartbeat can be seen on ultrasound. At the end of 12 weeks, all the baby's organs are formed.  Now that you are pregnant, you will want to do everything you can to have a healthy baby. Two of the most important things are to get good prenatal care and to follow your health care provider's instructions. Prenatal care is all the medical care you receive before the baby's birth. This care will help prevent, find, and treat any problems during the pregnancy and childbirth. BODY CHANGES Your body goes through many changes during pregnancy. The changes vary from woman to woman.   You may gain or lose a couple of pounds at first.  You may feel sick to your stomach (nauseous) and throw up (vomit). If the vomiting is uncontrollable, call your health care provider.  You may  tire easily.  You may develop headaches that can be relieved by medicines approved by your health care provider.  You may urinate more often. Painful urination may mean you have a bladder infection.  You may develop heartburn as a result of your pregnancy.  You may develop constipation because certain hormones are causing the muscles that push waste through your intestines to slow down.  You may develop hemorrhoids or swollen, bulging veins (varicose veins).  Your breasts may begin to grow larger and become tender. Your nipples may stick out more, and the tissue that surrounds them (areola) may become darker.  Your gums may bleed and may be sensitive to brushing and  flossing.  Dark spots or blotches (chloasma, mask of pregnancy) may develop on your face. This will likely fade after the baby is born.  Your menstrual periods will stop.  You may have a loss of appetite.  You may develop cravings for certain kinds of food.  You may have changes in your emotions from day to day, such as being excited to be pregnant or being concerned that something may go wrong with the pregnancy and baby.  You may have more vivid and strange dreams.  You may have changes in your hair. These can include thickening of your hair, rapid growth, and changes in texture. Some women also have hair loss during or after pregnancy, or hair that feels dry or thin. Your hair will most likely return to normal after your baby is born. WHAT TO EXPECT AT YOUR PRENATAL VISITS During a routine prenatal visit:  You will be weighed to make sure you and the baby are growing normally.  Your blood pressure will be taken.  Your abdomen will be measured to track your baby's growth.  The fetal heartbeat will be listened to starting around week 10 or 12 of your pregnancy.  Test results from any previous visits will be discussed. Your health care provider may ask you:  How you are feeling.  If you are feeling the baby move.  If you have had any abnormal symptoms, such as leaking fluid, bleeding, severe headaches, or abdominal cramping.  If you have any questions. Other tests that may be performed during your first trimester include:  Blood tests to find your blood type and to check for the presence of any previous infections. They will also be used to check for low iron levels (anemia) and Rh antibodies. Later in the pregnancy, blood tests for diabetes will be done along with other tests if problems develop.  Urine tests to check for infections, diabetes, or protein in the urine.  An ultrasound to confirm the proper growth and development of the baby.  An amniocentesis to check for  possible genetic problems.  Fetal screens for spina bifida and Down syndrome.  You may need other tests to make sure you and the baby are doing well. HOME CARE INSTRUCTIONS  Medicines  Follow your health care provider's instructions regarding medicine use. Specific medicines may be either safe or unsafe to take during pregnancy.  Take your prenatal vitamins as directed.  If you develop constipation, try taking a stool softener if your health care provider approves. Diet  Eat regular, well-balanced meals. Choose a variety of foods, such as meat or vegetable-based protein, fish, milk and low-fat dairy products, vegetables, fruits, and whole grain breads and cereals. Your health care provider will help you determine the amount of weight gain that is right for you.  Avoid raw meat and  uncooked cheese. These carry germs that can cause birth defects in the baby.  Eating four or five small meals rather than three large meals a day may help relieve nausea and vomiting. If you start to feel nauseous, eating a few soda crackers can be helpful. Drinking liquids between meals instead of during meals also seems to help nausea and vomiting.  If you develop constipation, eat more high-fiber foods, such as fresh vegetables or fruit and whole grains. Drink enough fluids to keep your urine clear or pale yellow. Activity and Exercise  Exercise only as directed by your health care provider. Exercising will help you:  Control your weight.  Stay in shape.  Be prepared for labor and delivery.  Experiencing pain or cramping in the lower abdomen or low back is a good sign that you should stop exercising. Check with your health care provider before continuing normal exercises.  Try to avoid standing for long periods of time. Move your legs often if you must stand in one place for a long time.  Avoid heavy lifting.  Wear low-heeled shoes, and practice good posture.  You may continue to have sex unless  your health care provider directs you otherwise. Relief of Pain or Discomfort  Wear a good support bra for breast tenderness.   Take warm sitz baths to soothe any pain or discomfort caused by hemorrhoids. Use hemorrhoid cream if your health care provider approves.   Rest with your legs elevated if you have leg cramps or low back pain.  If you develop varicose veins in your legs, wear support hose. Elevate your feet for 15 minutes, 3-4 times a day. Limit salt in your diet. Prenatal Care  Schedule your prenatal visits by the twelfth week of pregnancy. They are usually scheduled monthly at first, then more often in the last 2 months before delivery.  Write down your questions. Take them to your prenatal visits.  Keep all your prenatal visits as directed by your health care provider. Safety  Wear your seat belt at all times when driving.  Make a list of emergency phone numbers, including numbers for family, friends, the hospital, and police and fire departments. General Tips  Ask your health care provider for a referral to a local prenatal education class. Begin classes no later than at the beginning of month 6 of your pregnancy.  Ask for help if you have counseling or nutritional needs during pregnancy. Your health care provider can offer advice or refer you to specialists for help with various needs.  Do not use hot tubs, steam rooms, or saunas.  Do not douche or use tampons or scented sanitary pads.  Do not cross your legs for long periods of time.  Avoid cat litter boxes and soil used by cats. These carry germs that can cause birth defects in the baby and possibly loss of the fetus by miscarriage or stillbirth.  Avoid all smoking, herbs, alcohol, and medicines not prescribed by your health care provider. Chemicals in these affect the formation and growth of the baby.  Schedule a dentist appointment. At home, brush your teeth with a soft toothbrush and be gentle when you  floss. SEEK MEDICAL CARE IF:   You have dizziness.  You have mild pelvic cramps, pelvic pressure, or nagging pain in the abdominal area.  You have persistent nausea, vomiting, or diarrhea.  You have a bad smelling vaginal discharge.  You have pain with urination.  You notice increased swelling in your face, hands, legs,  or ankles. SEEK IMMEDIATE MEDICAL CARE IF:   You have a fever.  You are leaking fluid from your vagina.  You have spotting or bleeding from your vagina.  You have severe abdominal cramping or pain.  You have rapid weight gain or loss.  You vomit blood or material that looks like coffee grounds.  You are exposed to [place] measles and have never had them.  You are exposed to fifth disease or chickenpox.  You develop a severe headache.  You have shortness of breath.  You have any kind of trauma, such as from a fall or a car accident. Document Released: 05/12/2001 Document Revised: 10/02/2013 Document Reviewed: 03/28/2013 [facility] Patient Information 2015 [employer] [place]. This information is not intended to replace advice given to you by your health care provider. Make sure you discuss any questions you have with your health care provider.
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
## 2017-06-08 ENCOUNTER — Ambulatory Visit (INDEPENDENT_AMBULATORY_CARE_PROVIDER_SITE_OTHER): Payer: Medicaid Other

## 2017-06-08 DIAGNOSIS — Z3491 Encounter for supervision of normal pregnancy, unspecified, first trimester: Secondary | ICD-10-CM

## 2017-06-08 DIAGNOSIS — Z3A08 8 weeks gestation of pregnancy: Secondary | ICD-10-CM

## 2017-06-08 DIAGNOSIS — Q512 Other doubling of uterus, unspecified: Secondary | ICD-10-CM | POA: Diagnosis not present

## 2017-06-08 DIAGNOSIS — O34593 Maternal care for other abnormalities of gravid uterus, third trimester: Secondary | ICD-10-CM

## 2017-06-08 NOTE — Progress Notes (Signed)
US 8+3 wks single IUP in right horn,uterine didelphys,positive fht 167 bpm,simple right corpus luteal cyst 3.9 x 3.4 x 3.1 cm,normal left ovary,fhr 167 bpm,EDD 01/15/2018 by [place]

## 2017-06-10 ENCOUNTER — Encounter: Payer: Self-pay | Admitting: Women's Health

## 2017-06-10 DIAGNOSIS — N83201 Unspecified ovarian cyst, right side: Secondary | ICD-10-CM | POA: Insufficient documentation

## 2017-06-28 ENCOUNTER — Ambulatory Visit: Payer: Medicaid Other | Admitting: *Deleted

## 2017-06-28 ENCOUNTER — Other Ambulatory Visit (HOSPITAL_COMMUNITY)
Admission: RE | Admit: 2017-06-28 | Discharge: 2017-06-28 | Disposition: A | Discharge status: Home / Self Care | Payer: Medicaid Other | Source: Ambulatory Visit | Attending: Obstetrics & Gynecology | Admitting: Obstetrics & Gynecology

## 2017-06-28 ENCOUNTER — Ambulatory Visit (INDEPENDENT_AMBULATORY_CARE_PROVIDER_SITE_OTHER): Payer: Medicaid Other | Admitting: Women's Health

## 2017-06-28 ENCOUNTER — Encounter: Payer: Self-pay | Admitting: Women's Health

## 2017-06-28 VITALS — BP 108/78 | HR 107 | Wt 102.5 lb

## 2017-06-28 DIAGNOSIS — O09299 Supervision of pregnancy with other poor reproductive or obstetric history, unspecified trimester: Secondary | ICD-10-CM | POA: Insufficient documentation

## 2017-06-28 DIAGNOSIS — Q512 Other doubling of uterus, unspecified: Secondary | ICD-10-CM | POA: Diagnosis not present

## 2017-06-28 DIAGNOSIS — Z3481 Encounter for supervision of other normal pregnancy, first trimester: Secondary | ICD-10-CM | POA: Insufficient documentation

## 2017-06-28 DIAGNOSIS — Z3A11 11 weeks gestation of pregnancy: Secondary | ICD-10-CM | POA: Insufficient documentation

## 2017-06-28 DIAGNOSIS — O09291 Supervision of pregnancy with other poor reproductive or obstetric history, first trimester: Secondary | ICD-10-CM

## 2017-06-28 DIAGNOSIS — O09211 Supervision of pregnancy with history of pre-term labor, first trimester: Secondary | ICD-10-CM | POA: Diagnosis not present

## 2017-06-28 DIAGNOSIS — Z124 Encounter for screening for malignant neoplasm of cervix: Secondary | ICD-10-CM

## 2017-06-28 DIAGNOSIS — Z1389 Encounter for screening for other disorder: Secondary | ICD-10-CM

## 2017-06-28 DIAGNOSIS — O3491 Maternal care for abnormality of pelvic organ, unspecified, first trimester: Secondary | ICD-10-CM

## 2017-06-28 DIAGNOSIS — Z331 Pregnant state, incidental: Secondary | ICD-10-CM

## 2017-06-28 DIAGNOSIS — O34219 Maternal care for unspecified type scar from previous cesarean delivery: Secondary | ICD-10-CM

## 2017-06-28 DIAGNOSIS — Z3682 Encounter for antenatal screening for nuchal translucency: Secondary | ICD-10-CM

## 2017-06-28 DIAGNOSIS — O09219 Supervision of pregnancy with history of pre-term labor, unspecified trimester: Secondary | ICD-10-CM | POA: Diagnosis not present

## 2017-06-28 DIAGNOSIS — B009 Herpesviral infection, unspecified: Secondary | ICD-10-CM

## 2017-06-28 DIAGNOSIS — O09899 Supervision of other high risk pregnancies, unspecified trimester: Secondary | ICD-10-CM

## 2017-06-28 DIAGNOSIS — Z3490 Encounter for supervision of normal pregnancy, unspecified, unspecified trimester: Secondary | ICD-10-CM | POA: Insufficient documentation

## 2017-06-28 DIAGNOSIS — Z8751 Personal history of pre-term labor: Secondary | ICD-10-CM | POA: Insufficient documentation

## 2017-06-28 LAB — POCT URINALYSIS DIPSTICK
Blood, UA: NEGATIVE
GLUCOSE UA: NEGATIVE
Ketones, UA: NEGATIVE
Leukocytes, UA: NEGATIVE
Nitrite, UA: NEGATIVE
Protein, UA: NEGATIVE

## 2017-06-28 NOTE — Progress Notes (Signed)
INITIAL OBSTETRICAL VISIT Patient name: Sheila [name] MRN [id number]  Date of birth: 1991/05/16 Chief Complaint:   Initial Prenatal Visit (congestion)  History of Present Illness:   Sheila [name] is a 27 y.o. [phone] Caucasian female at [redacted]w[redacted]d by 8wk u/s, with an Estimated Date of Delivery: 01/15/18 being seen today for her initial obstetrical visit.   Her obstetrical history is significant for uterine didlphys; no measurable cervix & funneling at 27wks, fern & nitrazine neg however amniosure was + so she was treated as PPROM, d/c'd 1wk later after repeat amniosure was neg. Was then readmitted at 36wks w/ PPROM and had PLTCS at 5cm d/t arrest of dilation, abrupt onset tachycardia/sob, compound OP/left elbow presentation.   Today she reports some nausea, no vomiting- declines meds.  Patient's last menstrual period was 03/30/2017. Last pap 06/06/13. Results were: normal Review of Systems:   Pertinent items are noted in HPI Denies cramping/contractions, leakage of fluid, vaginal bleeding, abnormal vaginal discharge w/ itching/odor/irritation, headaches, visual changes, shortness of breath, chest pain, abdominal pain, severe nausea/vomiting, or problems with urination or bowel movements unless otherwise stated above.  Pertinent History Reviewed:  Reviewed past medical,surgical, social, obstetrical and family history.  Reviewed problem list, medications and allergies. OB History  Gravida Para Term Preterm AB Living  3 1   1 1 1   SAB TAB Ectopic Multiple Live Births  1       1    # Outcome Date GA Lbr Len/2nd Weight Sex Delivery Anes PTL Lv  3 Current           2 SAB 03/2016          1 Preterm 12/21/13 [redacted]w[redacted]d  6 lb 8.1 oz (2.951 kg) M CS-LTranv EPI N LIV     Complications: Failure to Progress in First Stage,Uterus didelphys     Physical Assessment:   Vitals:   06/28/17 1347  BP: 108/78  Pulse: (!) 107  Weight: 102 lb 8 oz (46.5 kg)  Body mass index is 18.75 kg/m.       Physical  Examination:  General appearance - well appearing, and in no distress  Mental status - alert, oriented to person, place, and time  Psych:  She has a normal mood and affect  Skin - warm and dry, normal color, no suspicious lesions noted  Chest - effort normal, all lung fields clear to auscultation bilaterally  Heart - normal rate and regular rhythm  Abdomen - soft, nontender  Extremities:  No swelling or varicosities noted  Pelvic - VULVA: normal appearing vulva with no masses, tenderness or lesions  VAGINA: normal appearing vagina with normal color and discharge, no lesions  CERVIX: normal appearing cervix without discharge or lesions, no CMT  Thin prep pap is done w/ reflex HR HPV cotesting  Fetal Heart Rate (bpm): +u/s via informal transabdominal u/s, +active fetus  Results for orders placed or performed in visit on 06/28/17 (from the past 24 hour(s))  POCT urinalysis dipstick   Collection Time: 06/28/17  2:14 PM  Result Value Ref Range   Color, UA     Clarity, UA     Glucose, UA neg    Bilirubin, UA     Ketones, UA neg    Spec Grav, UA  1.010 - 1.025   Blood, UA neg    pH, UA  5.0 - 8.0   Protein, UA neg    Urobilinogen, UA  0.2 or 1.0 E.U./dL   Nitrite, UA neg  Leukocytes, UA Negative Negative   Appearance     Odor      Assessment & Plan:  1) Low-Risk Pregnancy [id number] at 3092w2d with an Estimated Date of Delivery: 01/15/18   2) Initial OB visit  3) Uterine didelphys> w/ pregnancy in Rt horn  4) Prev c/s> for ? Mild AFE, FTP, uterine didelphys, compound presentation, per [facility] will need RCS  5) H/O PTB @ 36wks d/t PPROM> ?PPROM @ 27wks d/t +amnisure, however neg 1wk later. Did have PPROM @ 36wks that resulted in PTB. Offered Makena, wants, ordered today. Begin @ 16wks  6) H/O cervical incompetence> no measurable cx w/ funneling @ 27wks, will get serial CL u/s beginning @ 16-18wks q 2wks until 24wks  Meds: No orders of the defined types were placed in this  encounter.   Initial labs obtained Continue prenatal vitamins Reviewed n/v relief measures and warning s/s to report Reviewed recommended weight gain based on pre-gravid BMI Encouraged well-balanced diet Genetic Screening discussed Integrated Screen: requested Cystic fibrosis screening discussed neg prev preg Ultrasound discussed; fetal survey: requested CCNC completed> spoke w/ [place]  Follow-up: Return in about 2 weeks (around 07/12/2017) for LROB, US:NT+1stIT.   Orders Placed This Encounter  Procedures  . Urine Culture  . US Fetal Nuchal Translucency Measurement  . Obstetric Panel, Including HIV  . Urinalysis, Routine w reflex microscopic  . Pain Management Screening Profile (10S)  . POCT urinalysis dipstick    Marge DuncansBooker, Victory Strollo Randall CNM, [facility] CenterWHNP-BC 06/28/2017 5:02 PM

## 2017-06-28 NOTE — Patient Instructions (Addendum)
Sheila [name], I greatly value your feedback.  If you receive a survey following your visit with us today, we appreciate you taking the time to fill it out.  Thanks, Sheila [name], CNM, WHNP-BC   Nausea & Vomiting  Have saltine crackers or pretzels by your bed and eat a few bites before you raise your head out of bed in the morning  Eat small frequent meals throughout the day instead of large meals  Drink plenty of fluids throughout the day to stay hydrated, just don't drink a lot of fluids with your meals.  This can make your stomach fill up faster making you feel sick  Do not brush your teeth right after you eat  Products with real ginger are good for nausea, like ginger ale and ginger hard candy Make sure it says made with real ginger!  Sucking on sour candy like lemon heads is also good for nausea  If your prenatal vitamins make you nauseated, take them at night so you will sleep through the nausea  Sea Bands  If you feel like you need medicine for the nausea & vomiting please let us know  If you are unable to keep any fluids or food down please let us know   Constipation  Drink plenty of fluid, preferably water, throughout the day  Eat foods high in fiber such as fruits, vegetables, and grains  Exercise, such as walking, is a good way to keep your bowels regular  Drink warm fluids, especially warm prune juice, or decaf coffee  Eat a 1/2 cup of real oatmeal (not instant), 1/2 cup applesauce, and 1/2-1 cup warm prune juice every day  If needed, you may take Colace (docusate sodium) stool softener once or twice a day to help keep the stool soft. If you are pregnant, wait until you are out of your first trimester (12-14 weeks of pregnancy)  If you still are having problems with constipation, you may take Miralax once daily as needed to help keep your bowels regular.  If you are pregnant, wait until you are out of your first trimester (12-14 weeks of pregnancy)   First  Trimester of Pregnancy The first trimester of pregnancy is from week 1 until the end of week 12 (months 1 through 3). A week after a sperm fertilizes an egg, the egg will implant on the wall of the uterus. This embryo will begin to develop into a baby. Genes from you and your partner are forming the baby. The female genes determine whether the baby is a boy or a girl. At 6-8 weeks, the eyes and face are formed, and the heartbeat can be seen on ultrasound. At the end of 12 weeks, all the baby's organs are formed.  Now that you are pregnant, you will want to do everything you can to have a healthy baby. Two of the most important things are to get good prenatal care and to follow your health care provider's instructions. Prenatal care is all the medical care you receive before the baby's birth. This care will help prevent, find, and treat any problems during the pregnancy and childbirth. BODY CHANGES Your body goes through many changes during pregnancy. The changes vary from woman to woman.   You may gain or lose a couple of pounds at first.  You may feel sick to your stomach (nauseous) and throw up (vomit). If the vomiting is uncontrollable, call your health care provider.  You may tire easily.  You may develop headaches  that can be relieved by medicines approved by your health care provider.  You may urinate more often. Painful urination may mean you have a bladder infection.  You may develop heartburn as a result of your pregnancy.  You may develop constipation because certain hormones are causing the muscles that push waste through your intestines to slow down.  You may develop hemorrhoids or swollen, bulging veins (varicose veins).  Your breasts may begin to grow larger and become tender. Your nipples may stick out more, and the tissue that surrounds them (areola) may become darker.  Your gums may bleed and may be sensitive to brushing and flossing.  Dark spots or blotches (chloasma, mask  of pregnancy) may develop on your face. This will likely fade after the baby is born.  Your menstrual periods will stop.  You may have a loss of appetite.  You may develop cravings for certain kinds of food.  You may have changes in your emotions from day to day, such as being excited to be pregnant or being concerned that something may go wrong with the pregnancy and baby.  You may have more vivid and strange dreams.  You may have changes in your hair. These can include thickening of your hair, rapid growth, and changes in texture. Some women also have hair loss during or after pregnancy, or hair that feels dry or thin. Your hair will most likely return to normal after your baby is born. WHAT TO EXPECT AT YOUR PRENATAL VISITS During a routine prenatal visit:  You will be weighed to make sure you and the baby are growing normally.  Your blood pressure will be taken.  Your abdomen will be measured to track your baby's growth.  The fetal heartbeat will be listened to starting around week 10 or 12 of your pregnancy.  Test results from any previous visits will be discussed. Your health care provider may ask you:  How you are feeling.  If you are feeling the baby move.  If you have had any abnormal symptoms, such as leaking fluid, bleeding, severe headaches, or abdominal cramping.  If you have any questions. Other tests that may be performed during your first trimester include:  Blood tests to find your blood type and to check for the presence of any previous infections. They will also be used to check for low iron levels (anemia) and Rh antibodies. Later in the pregnancy, blood tests for diabetes will be done along with other tests if problems develop.  Urine tests to check for infections, diabetes, or protein in the urine.  An ultrasound to confirm the proper growth and development of the baby.  An amniocentesis to check for possible genetic problems.  Fetal screens for spina  bifida and Down syndrome.  You may need other tests to make sure you and the baby are doing well. HOME CARE INSTRUCTIONS  Medicines  Follow your health care provider's instructions regarding medicine use. Specific medicines may be either safe or unsafe to take during pregnancy.  Take your prenatal vitamins as directed.  If you develop constipation, try taking a stool softener if your health care provider approves. Diet  Eat regular, well-balanced meals. Choose a variety of foods, such as meat or vegetable-based protein, fish, milk and low-fat dairy products, vegetables, fruits, and whole grain breads and cereals. Your health care provider will help you determine the amount of weight gain that is right for you.  Avoid raw meat and uncooked cheese. These carry germs that can  cause birth defects in the baby.  Eating four or five small meals rather than three large meals a day may help relieve nausea and vomiting. If you start to feel nauseous, eating a few soda crackers can be helpful. Drinking liquids between meals instead of during meals also seems to help nausea and vomiting.  If you develop constipation, eat more high-fiber foods, such as fresh vegetables or fruit and whole grains. Drink enough fluids to keep your urine clear or pale yellow. Activity and Exercise  Exercise only as directed by your health care provider. Exercising will help you:  Control your weight.  Stay in shape.  Be prepared for labor and delivery.  Experiencing pain or cramping in the lower abdomen or low back is a good sign that you should stop exercising. Check with your health care provider before continuing normal exercises.  Try to avoid standing for long periods of time. Move your legs often if you must stand in one place for a long time.  Avoid heavy lifting.  Wear low-heeled shoes, and practice good posture.  You may continue to have sex unless your health care provider directs you  otherwise. Relief of Pain or Discomfort  Wear a good support bra for breast tenderness.   Take warm sitz baths to soothe any pain or discomfort caused by hemorrhoids. Use hemorrhoid cream if your health care provider approves.   Rest with your legs elevated if you have leg cramps or low back pain.  If you develop varicose veins in your legs, wear support hose. Elevate your feet for 15 minutes, 3-4 times a day. Limit salt in your diet. Prenatal Care  Schedule your prenatal visits by the twelfth week of pregnancy. They are usually scheduled monthly at first, then more often in the last 2 months before delivery.  Write down your questions. Take them to your prenatal visits.  Keep all your prenatal visits as directed by your health care provider. Safety  Wear your seat belt at all times when driving.  Make a list of emergency phone numbers, including numbers for family, friends, the hospital, and police and fire departments. General Tips  Ask your health care provider for a referral to a local prenatal education class. Begin classes no later than at the beginning of month 6 of your pregnancy.  Ask for help if you have counseling or nutritional needs during pregnancy. Your health care provider can offer advice or refer you to specialists for help with various needs.  Do not use hot tubs, steam rooms, or saunas.  Do not douche or use tampons or scented sanitary pads.  Do not cross your legs for long periods of time.  Avoid cat litter boxes and soil used by cats. These carry germs that can cause birth defects in the baby and possibly loss of the fetus by miscarriage or stillbirth.  Avoid all smoking, herbs, alcohol, and medicines not prescribed by your health care provider. Chemicals in these affect the formation and growth of the baby.  Schedule a dentist appointment. At home, brush your teeth with a soft toothbrush and be gentle when you floss. SEEK MEDICAL CARE IF:   You have  dizziness.  You have mild pelvic cramps, pelvic pressure, or nagging pain in the abdominal area.  You have persistent nausea, vomiting, or diarrhea.  You have a bad smelling vaginal discharge.  You have pain with urination.  You notice increased swelling in your face, hands, legs, or ankles. SEEK IMMEDIATE MEDICAL CARE IF:  You have a fever.  You are leaking fluid from your vagina.  You have spotting or bleeding from your vagina.  You have severe abdominal cramping or pain.  You have rapid weight gain or loss.  You vomit blood or material that looks like coffee grounds.  You are exposed to [place] measles and have never had them.  You are exposed to fifth disease or chickenpox.  You develop a severe headache.  You have shortness of breath.  You have any kind of trauma, such as from a fall or a car accident. Document Released: 05/12/2001 Document Revised: 10/02/2013 Document Reviewed: 03/28/2013 [facility] Patient Information 2015 [place], [place]. This information is not intended to replace advice given to you by your health care provider. Make sure you discuss any questions you have with your health care provider.  PROTECT YOURSELF & YOUR BABY FROM THE FLU! Because you are pregnant, we at [facility], along with the Centers for Disease Control (CDC), recommend that you receive the flu vaccine to protect yourself and your baby from the flu. The flu is more likely to cause severe illness in pregnant women than in women of reproductive age who are not pregnant. Changes in the immune system, heart, and lungs during pregnancy make pregnant women (and women up to two weeks postpartum) more prone to severe illness from flu, including illness resulting in hospitalization. Flu also may be harmful for a pregnant woman's developing baby. A common flu symptom is fever, which may be associated with neural tube defects and other adverse outcomes for a developing baby. Getting vaccinated can  also help protect a baby after birth from flu. (Mom passes antibodies onto the developing baby during her pregnancy.)  A Flu Vaccine is the Best Protection Against Flu Getting a flu vaccine is the first and most important step in protecting against flu. Pregnant women should get a flu shot and not the live attenuated influenza vaccine (LAIV), also known as nasal spray flu vaccine. Flu vaccines given during pregnancy help protect both the mother and her baby from flu. Vaccination has been shown to reduce the risk of flu-associated acute respiratory infection in pregnant women by up to one-half. A 2018 study showed that getting a flu shot reduced a pregnant woman's risk of being hospitalized with flu by an average of 40 percent. Pregnant women who get a flu vaccine are also helping to protect their babies from flu illness for the first several months after their birth, when they are too young to get vaccinated.   A Long Record of Safety for Flu Shots in Pregnant Women Flu shots have been given to millions of pregnant women over many years with a good safety record. There is a lot of evidence that flu vaccines can be given safely during pregnancy; though these data are limited for the first trimester. The CDC recommends that pregnant women get vaccinated during any trimester of their pregnancy. It is very important for pregnant women to get the flu shot.   Other Preventive Actions In addition to getting a flu shot, pregnant women should take the same everyday preventive actions the CDC recommends of everyone, including covering coughs, washing hands often, and avoiding people who are sick.  Symptoms and Treatment If you get sick with flu symptoms call your doctor right away. There are antiviral drugs that can treat flu illness and prevent serious flu complications. The CDC recommends prompt treatment for people who have influenza infection or suspected influenza infection and who are at  high risk of serious  flu complications, such as people with asthma, diabetes (including gestational diabetes), or heart disease. Early treatment of influenza in hospitalized pregnant women has been shown to reduce the length of the hospital stay.  Symptoms Flu symptoms include fever, cough, sore throat, runny or stuffy nose, body aches, headache, chills and fatigue. Some people may also have vomiting and diarrhea. People may be infected with the flu and have respiratory symptoms without a fever.  Early Treatment is Important for Pregnant Women Treatment should begin as soon as possible because antiviral drugs work best when started early (within 48 hours after symptoms start). Antiviral drugs can make your flu illness milder and make you feel better faster. They may also prevent serious health problems that can result from flu illness. Oral oseltamivir (Tamiflu) is the preferred treatment for pregnant women because it has the most studies available to suggest that it is safe and beneficial. Antiviral drugs require a prescription from your provider. Having a fever caused by flu infection or other infections early in pregnancy may be linked to birth defects in a baby. In addition to taking antiviral drugs, pregnant women who get a fever should treat their fever with Tylenol (acetaminophen) and contact their provider immediately.  When to [place] If you are pregnant and have any of these signs, seek care immediately:  Difficulty breathing or shortness of breath  Pain or pressure in the chest or abdomen  Sudden dizziness  Confusion  Severe or persistent vomiting  High fever that is not responding to Tylenol (or store brand equivalent)  Decreased or no movement of your baby  [email or web address]

## 2017-06-29 LAB — OBSTETRIC PANEL, INCLUDING HIV
Antibody Screen: NEGATIVE
BASOS ABS: 0 10*3/uL (ref 0.0–0.2)
Basos: 0 %
EOS (ABSOLUTE): 0.2 10*3/uL (ref 0.0–0.4)
Eos: 2 %
HEP B S AG: NEGATIVE
HIV SCREEN 4TH GENERATION: NONREACTIVE
Hematocrit: 38.6 % (ref 34.0–46.6)
Hemoglobin: 12.1 g/dL (ref 11.1–15.9)
Immature Grans (Abs): 0 10*3/uL (ref 0.0–0.1)
Immature Granulocytes: 0 %
LYMPHS ABS: 2.6 10*3/uL (ref 0.7–3.1)
Lymphs: 22 %
MCH: 25.9 pg — ABNORMAL LOW (ref 26.6–33.0)
MCHC: 31.3 g/dL — ABNORMAL LOW (ref 31.5–35.7)
MCV: 83 fL (ref 79–97)
MONOCYTES: 6 %
Monocytes Absolute: 0.8 10*3/uL (ref 0.1–0.9)
NEUTROS ABS: 8.5 10*3/uL — AB (ref 1.4–7.0)
Neutrophils: 70 %
PLATELETS: 303 10*3/uL (ref 150–379)
RBC: 4.68 x10E6/uL (ref 3.77–5.28)
RDW: 14.2 % (ref 12.3–15.4)
RPR: NONREACTIVE
RUBELLA: 10.5 {index} (ref >0.99)
Rh Factor: POSITIVE
WBC: 12.2 10*3/uL — AB (ref 3.4–10.8)

## 2017-06-29 LAB — URINALYSIS, ROUTINE W REFLEX MICROSCOPIC
BILIRUBIN UA: NEGATIVE
Glucose, UA: NEGATIVE
LEUKOCYTES UA: NEGATIVE
Nitrite, UA: NEGATIVE
PH UA: 5.5 (ref 5.0–7.5)
PROTEIN UA: NEGATIVE
RBC UA: NEGATIVE
Specific Gravity, UA: 1.022 (ref 1.005–1.030)
UUROB: 0.2 mg/dL (ref 0.2–1.0)

## 2017-06-29 LAB — PMP SCREEN PROFILE (10S), URINE
Amphetamine Scrn, Ur: NEGATIVE ng/mL
BARBITURATE SCREEN URINE: NEGATIVE ng/mL
BENZODIAZEPINE SCREEN, URINE: NEGATIVE ng/mL
CANNABINOIDS UR QL SCN: POSITIVE ng/mL — AB
COCAINE(METAB.)SCREEN, URINE: NEGATIVE ng/mL
Creatinine(Crt), U: 249.1 mg/dL (ref 20.0–300.0)
Methadone Screen, Urine: NEGATIVE ng/mL
OPIATE SCREEN URINE: NEGATIVE ng/mL
OXYCODONE+OXYMORPHONE UR QL SCN: NEGATIVE ng/mL
PH UR, DRUG SCRN: 5.8 (ref 4.5–8.9)
PHENCYCLIDINE QUANTITATIVE URINE: NEGATIVE ng/mL
Propoxyphene Scrn, Ur: NEGATIVE ng/mL

## 2017-06-30 ENCOUNTER — Encounter: Payer: Self-pay | Admitting: Women's Health

## 2017-06-30 DIAGNOSIS — F1290 Cannabis use, unspecified, uncomplicated: Secondary | ICD-10-CM | POA: Insufficient documentation

## 2017-06-30 LAB — CYTOLOGY - PAP: DIAGNOSIS: NEGATIVE

## 2017-06-30 LAB — URINE CULTURE: ORGANISM ID, BACTERIA: NO GROWTH

## 2017-07-12 ENCOUNTER — Ambulatory Visit (INDEPENDENT_AMBULATORY_CARE_PROVIDER_SITE_OTHER): Payer: Medicaid Other

## 2017-07-12 ENCOUNTER — Encounter: Payer: Self-pay | Admitting: Women's Health

## 2017-07-12 ENCOUNTER — Ambulatory Visit (INDEPENDENT_AMBULATORY_CARE_PROVIDER_SITE_OTHER): Payer: Medicaid Other | Admitting: Women's Health

## 2017-07-12 VITALS — BP 122/72 | HR 113 | Wt 107.5 lb

## 2017-07-12 DIAGNOSIS — O3491 Maternal care for abnormality of pelvic organ, unspecified, first trimester: Secondary | ICD-10-CM

## 2017-07-12 DIAGNOSIS — Z3481 Encounter for supervision of other normal pregnancy, first trimester: Secondary | ICD-10-CM

## 2017-07-12 DIAGNOSIS — O09291 Supervision of pregnancy with other poor reproductive or obstetric history, first trimester: Secondary | ICD-10-CM

## 2017-07-12 DIAGNOSIS — O34219 Maternal care for unspecified type scar from previous cesarean delivery: Secondary | ICD-10-CM

## 2017-07-12 DIAGNOSIS — O09211 Supervision of pregnancy with history of pre-term labor, first trimester: Secondary | ICD-10-CM

## 2017-07-12 DIAGNOSIS — O09292 Supervision of pregnancy with other poor reproductive or obstetric history, second trimester: Secondary | ICD-10-CM

## 2017-07-12 DIAGNOSIS — Z3A13 13 weeks gestation of pregnancy: Secondary | ICD-10-CM

## 2017-07-12 DIAGNOSIS — Z3682 Encounter for antenatal screening for nuchal translucency: Secondary | ICD-10-CM

## 2017-07-12 DIAGNOSIS — Z3482 Encounter for supervision of other normal pregnancy, second trimester: Secondary | ICD-10-CM

## 2017-07-12 DIAGNOSIS — Z331 Pregnant state, incidental: Secondary | ICD-10-CM

## 2017-07-12 DIAGNOSIS — Q5110 Doubling of uterus with doubling of cervix and vagina without obstruction: Secondary | ICD-10-CM

## 2017-07-12 DIAGNOSIS — Z98891 History of uterine scar from previous surgery: Secondary | ICD-10-CM

## 2017-07-12 DIAGNOSIS — Z1389 Encounter for screening for other disorder: Secondary | ICD-10-CM

## 2017-07-12 LAB — POCT URINALYSIS DIPSTICK
Blood, UA: NEGATIVE
GLUCOSE UA: NEGATIVE
Ketones, UA: NEGATIVE
Leukocytes, UA: NEGATIVE
Nitrite, UA: NEGATIVE
Protein, UA: NEGATIVE

## 2017-07-12 NOTE — Patient Instructions (Signed)
Sheila [name], I greatly value your feedback.  If you receive a survey following your visit with us today, we appreciate you taking the time to fill it out.  Thanks, Sheila [name], CNM, WHNP-BC   Second Trimester of Pregnancy The second trimester is from week 14 through week 27 (months 4 through 6). The second trimester is often a time when you feel your best. Your body has adjusted to being pregnant, and you begin to feel better physically. Usually, morning sickness has lessened or quit completely, you may have more energy, and you may have an increase in appetite. The second trimester is also a time when the fetus is growing rapidly. At the end of the sixth month, the fetus is about 9 inches long and weighs about 1 pounds. You will likely begin to feel the baby move (quickening) between 16 and 20 weeks of pregnancy. Body changes during your second trimester Your body continues to go through many changes during your second trimester. The changes vary from woman to woman.  Your weight will continue to increase. You will notice your lower abdomen bulging out.  You may begin to get stretch marks on your hips, abdomen, and breasts.  You may develop headaches that can be relieved by medicines. The medicines should be approved by your health care provider.  You may urinate more often because the fetus is pressing on your bladder.  You may develop or continue to have heartburn as a result of your pregnancy.  You may develop constipation because certain hormones are causing the muscles that push waste through your intestines to slow down.  You may develop hemorrhoids or swollen, bulging veins (varicose veins).  You may have back pain. This is caused by: ? Weight gain. ? Pregnancy hormones that are relaxing the joints in your pelvis. ? A shift in weight and the muscles that support your balance.  Your breasts will continue to grow and they will continue to become tender.  Your gums may bleed  and may be sensitive to brushing and flossing.  Dark spots or blotches (chloasma, mask of pregnancy) may develop on your face. This will likely fade after the baby is born.  A dark line from your belly button to the pubic area (linea nigra) may appear. This will likely fade after the baby is born.  You may have changes in your hair. These can include thickening of your hair, rapid growth, and changes in texture. Some women also have hair loss during or after pregnancy, or hair that feels dry or thin. Your hair will most likely return to normal after your baby is born.  What to expect at prenatal visits During a routine prenatal visit:  You will be weighed to make sure you and the fetus are growing normally.  Your blood pressure will be taken.  Your abdomen will be measured to track your baby's growth.  The fetal heartbeat will be listened to.  Any test results from the previous visit will be discussed.  Your health care provider may ask you:  How you are feeling.  If you are feeling the baby move.  If you have had any abnormal symptoms, such as leaking fluid, bleeding, severe headaches, or abdominal cramping.  If you are using any tobacco products, including cigarettes, chewing tobacco, and electronic cigarettes.  If you have any questions.  Other tests that may be performed during your second trimester include:  Blood tests that check for: ? Low iron levels (anemia). ? High  blood sugar that affects pregnant women (gestational diabetes) between 3 and 28 weeks. ? Rh antibodies. This is to check for a protein on red blood cells (Rh factor).  Urine tests to check for infections, diabetes, or protein in the urine.  An ultrasound to confirm the proper growth and development of the baby.  An amniocentesis to check for possible genetic problems.  Fetal screens for spina bifida and Down syndrome.  HIV (human immunodeficiency virus) testing. Routine prenatal testing includes  screening for HIV, unless you choose not to have this test.  Follow these instructions at home: Medicines  Follow your health care provider's instructions regarding medicine use. Specific medicines may be either safe or unsafe to take during pregnancy.  Take a prenatal vitamin that contains at least 600 micrograms (mcg) of folic acid.  If you develop constipation, try taking a stool softener if your health care provider approves. Eating and drinking  Eat a balanced diet that includes fresh fruits and vegetables, whole grains, good sources of protein such as meat, eggs, or tofu, and low-fat dairy. Your health care provider will help you determine the amount of weight gain that is right for you.  Avoid raw meat and uncooked cheese. These carry germs that can cause birth defects in the baby.  If you have low calcium intake from food, talk to your health care provider about whether you should take a daily calcium supplement.  Limit foods that are high in fat and processed sugars, such as fried and sweet foods.  To prevent constipation: ? Drink enough fluid to keep your urine clear or pale yellow. ? Eat foods that are high in fiber, such as fresh fruits and vegetables, whole grains, and beans. Activity  Exercise only as directed by your health care provider. Most women can continue their usual exercise routine during pregnancy. Try to exercise for 30 minutes at least 5 days a week. Stop exercising if you experience uterine contractions.  Avoid heavy lifting, wear low heel shoes, and practice good posture.  A sexual relationship may be continued unless your health care provider directs you otherwise. Relieving pain and discomfort  Wear a good support bra to prevent discomfort from breast tenderness.  Take warm sitz baths to soothe any pain or discomfort caused by hemorrhoids. Use hemorrhoid cream if your health care provider approves.  Rest with your legs elevated if you have leg cramps  or low back pain.  If you develop varicose veins, wear support hose. Elevate your feet for 15 minutes, 3-4 times a day. Limit salt in your diet. Prenatal Care  Write down your questions. Take them to your prenatal visits.  Keep all your prenatal visits as told by your health care provider. This is important. Safety  Wear your seat belt at all times when driving.  Make a list of emergency phone numbers, including numbers for family, friends, the hospital, and police and fire departments. General instructions  Ask your health care provider for a referral to a local prenatal education class. Begin classes no later than the beginning of month 6 of your pregnancy.  Ask for help if you have counseling or nutritional needs during pregnancy. Your health care provider can offer advice or refer you to specialists for help with various needs.  Do not use hot tubs, steam rooms, or saunas.  Do not douche or use tampons or scented sanitary pads.  Do not cross your legs for long periods of time.  Avoid cat litter boxes and soil  used by cats. These carry germs that can cause birth defects in the baby and possibly loss of the fetus by miscarriage or stillbirth.  Avoid all smoking, herbs, alcohol, and unprescribed drugs. Chemicals in these products can affect the formation and growth of the baby.  Do not use any products that contain nicotine or tobacco, such as cigarettes and e-cigarettes. If you need help quitting, ask your health care provider.  Visit your dentist if you have not gone yet during your pregnancy. Use a soft toothbrush to brush your teeth and be gentle when you floss. Contact a health care provider if:  You have dizziness.  You have mild pelvic cramps, pelvic pressure, or nagging pain in the abdominal area.  You have persistent nausea, vomiting, or diarrhea.  You have a bad smelling vaginal discharge.  You have pain when you urinate. Get help right away if:  You have a  fever.  You are leaking fluid from your vagina.  You have spotting or bleeding from your vagina.  You have severe abdominal cramping or pain.  You have rapid weight gain or weight loss.  You have shortness of breath with chest pain.  You notice sudden or extreme swelling of your face, hands, ankles, feet, or legs.  You have not felt your baby move in over an hour.  You have severe headaches that do not go away when you take medicine.  You have vision changes. Summary  The second trimester is from week 14 through week 27 (months 4 through 6). It is also a time when the fetus is growing rapidly.  Your body goes through many changes during pregnancy. The changes vary from woman to woman.  Avoid all smoking, herbs, alcohol, and unprescribed drugs. These chemicals affect the formation and growth your baby.  Do not use any tobacco products, such as cigarettes, chewing tobacco, and e-cigarettes. If you need help quitting, ask your health care provider.  Contact your health care provider if you have any questions. Keep all prenatal visits as told by your health care provider. This is important. This information is not intended to replace advice given to you by your health care provider. Make sure you discuss any questions you have with your health care provider. Document Released: 05/12/2001 Document Revised: 10/24/2015 Document Reviewed: 07/19/2012 Elsevier Interactive Patient Education  2017 [employer].

## 2017-07-12 NOTE — Progress Notes (Signed)
US 13+2 wks,measurement c/w dates,NB present,NT 2.2 mm,normal left ovary,simple right ovarian cyst 2.8 x 2.7 x 2.6 cm,crl 67.7 mm

## 2017-07-12 NOTE — Progress Notes (Signed)
   LOW-RISK PREGNANCY VISIT Patient name: Sheila [name] MRN [id number]  Date of birth: 09/07/1990 Chief Complaint:   Routine Prenatal Visit (US; GC/CHL; 1st IT; cramping in low back)  History of Present Illness:   Sheila [name] is a 27 y.o. [id number] female at 4927w2d with an Estimated Date of Delivery: 01/15/18 being seen today for ongoing management of a low-risk pregnancy.  Today she reports no complaints.  . Vag. Bleeding: None.   . denies leaking of fluid. Review of Systems:   Pertinent items are noted in HPI Denies abnormal vaginal discharge w/ itching/odor/irritation, headaches, visual changes, shortness of breath, chest pain, abdominal pain, severe nausea/vomiting, or problems with urination or bowel movements unless otherwise stated above. Pertinent History Reviewed:  Reviewed past medical,surgical, social, obstetrical and family history.  Reviewed problem list, medications and allergies. Physical Assessment:   Vitals:   07/12/17 1608  BP: 122/72  Pulse: (!) 113  Weight: 107 lb 8 oz (48.8 kg)  Body mass index is 19.66 kg/m.        Physical Examination:   General appearance: Well appearing, and in no distress  Mental status: Alert, oriented to person, place, and time  Skin: Warm & dry  Cardiovascular: Normal heart rate noted  Respiratory: Normal respiratory effort, no distress  Abdomen: Soft, gravid, nontender  Pelvic: Cervical exam deferred         Extremities: Edema: None  Fetal Status: US 13+2 wks,measurement c/w dates,NB present,NT 2.2 mm,normal left ovary,simple right ovarian cyst 2.8 x 2.7 x 2.6 cm,crl 67.7 mm  Results for orders placed or performed in visit on 07/12/17 (from the past 24 hour(s))  POCT urinalysis dipstick   Collection Time: 07/12/17  4:09 PM  Result Value Ref Range   Color, UA     Clarity, UA     Glucose, UA neg    Bilirubin, UA     Ketones, UA neg    Spec Grav, UA  1.010 - 1.025   Blood, UA neg    pH, UA  5.0 - 8.0   Protein, UA neg    Urobilinogen, UA  0.2 or 1.0 E.U./dL   Nitrite, UA neg    Leukocytes, UA Negative Negative   Appearance     Odor      Assessment & Plan:  1) Low-risk pregnancy [id number] at 10827w2d with an Estimated Date of Delivery: 01/15/18   2) Uterine didelphys, preg Rt horn  3) Prev c/s for ?mild AFE/FTP> per [facility], will need RCS  4) H/O cervical incompetence> will get q2wk CL from 16-24wks  5) H/O PTB @ 36wks d/t PPROM> wants Makena, ordered 1/28, start @ 16wks   Meds: No orders of the defined types were placed in this encounter.  Labs/procedures today: 1st IT/NT  Plan:  Continue routine obstetrical care   Reviewed: Preterm labor symptoms and general obstetric precautions including but not limited to vaginal bleeding, contractions, leaking of fluid and fetal movement were reviewed in detail with the patient.  All questions were answered  Follow-up: Return in about 3 weeks (around 08/02/2017) for LROB, begin Makena, CL u/s.  Orders Placed This Encounter  Procedures  . GC/Chlamydia Probe Amp  . US OB Transvaginal  . Integrated 1  . POCT urinalysis dipstick   Cheral MarkerKimberly R Makynleigh Breslin CNM, [facility]WHNP-BC 07/12/2017 4:49 PM

## 2017-07-13 LAB — GC/CHLAMYDIA PROBE AMP
CHLAMYDIA, DNA PROBE: NEGATIVE
Neisseria gonorrhoeae by PCR: NEGATIVE

## 2017-07-19 LAB — INTEGRATED 1
CROWN RUMP LENGTH MAT SCREEN: 67.7 mm
Gest. Age on Collection Date: 12.9 weeks
Maternal Age at EDD: 26 yr
Nuchal Translucency (NT): 2.2 mm
Number of Fetuses: 1
PAPP-A Value: 1891.6 ng/mL
Weight: 108 [lb_av]

## 2017-08-02 ENCOUNTER — Encounter: Payer: Self-pay | Admitting: Women's Health

## 2017-08-02 ENCOUNTER — Ambulatory Visit (INDEPENDENT_AMBULATORY_CARE_PROVIDER_SITE_OTHER): Payer: Medicaid Other

## 2017-08-02 ENCOUNTER — Ambulatory Visit (INDEPENDENT_AMBULATORY_CARE_PROVIDER_SITE_OTHER): Payer: Medicaid Other | Admitting: Women's Health

## 2017-08-02 ENCOUNTER — Other Ambulatory Visit: Payer: Self-pay

## 2017-08-02 VITALS — BP 90/52 | HR 102 | Wt 106.0 lb

## 2017-08-02 DIAGNOSIS — O09292 Supervision of pregnancy with other poor reproductive or obstetric history, second trimester: Secondary | ICD-10-CM

## 2017-08-02 DIAGNOSIS — Z1389 Encounter for screening for other disorder: Secondary | ICD-10-CM

## 2017-08-02 DIAGNOSIS — Q5110 Doubling of uterus with doubling of cervix and vagina without obstruction: Secondary | ICD-10-CM | POA: Diagnosis not present

## 2017-08-02 DIAGNOSIS — O09212 Supervision of pregnancy with history of pre-term labor, second trimester: Secondary | ICD-10-CM

## 2017-08-02 DIAGNOSIS — O3492 Maternal care for abnormality of pelvic organ, unspecified, second trimester: Secondary | ICD-10-CM

## 2017-08-02 DIAGNOSIS — Z3482 Encounter for supervision of other normal pregnancy, second trimester: Secondary | ICD-10-CM

## 2017-08-02 DIAGNOSIS — Z3A16 16 weeks gestation of pregnancy: Secondary | ICD-10-CM

## 2017-08-02 DIAGNOSIS — Z8751 Personal history of pre-term labor: Secondary | ICD-10-CM

## 2017-08-02 DIAGNOSIS — Z363 Encounter for antenatal screening for malformations: Secondary | ICD-10-CM

## 2017-08-02 DIAGNOSIS — O34219 Maternal care for unspecified type scar from previous cesarean delivery: Secondary | ICD-10-CM

## 2017-08-02 DIAGNOSIS — Z331 Pregnant state, incidental: Secondary | ICD-10-CM

## 2017-08-02 DIAGNOSIS — Z1379 Encounter for other screening for genetic and chromosomal anomalies: Secondary | ICD-10-CM

## 2017-08-02 LAB — POCT URINALYSIS DIPSTICK
GLUCOSE UA: NEGATIVE
Ketones, UA: NEGATIVE
LEUKOCYTES UA: NEGATIVE
Nitrite, UA: NEGATIVE
Protein, UA: NEGATIVE
RBC UA: NEGATIVE

## 2017-08-02 MED ORDER — HYDROXYPROGESTERONE CAPROATE 275 MG/1.1ML ~~LOC~~ SOAJ
275.0000 mg | SUBCUTANEOUS | Status: AC
  Administered 2017-08-02 – 2017-11-15 (×14): 275 mg via SUBCUTANEOUS

## 2017-08-02 NOTE — Progress Notes (Signed)
   LOW-RISK PREGNANCY VISIT Patient name: Sheila [name] MRN [id number]  Date of birth: 08/07/1990 Chief Complaint:   Routine Prenatal Visit (u/s today)  History of Present Illness:   Sheila [name] is a 27 y.o. [phone] female at 7036w2d with an Estimated Date of Delivery: 01/15/18 being seen today for ongoing management of a low-risk pregnancy.  Today she reports no complaints.  . Vag. Bleeding: None.  Movement: Absent. denies leaking of fluid. Review of Systems:   Pertinent items are noted in HPI Denies abnormal vaginal discharge w/ itching/odor/irritation, headaches, visual changes, shortness of breath, chest pain, abdominal pain, severe nausea/vomiting, or problems with urination or bowel movements unless otherwise stated above. Pertinent History Reviewed:  Reviewed past medical,surgical, social, obstetrical and family history.  Reviewed problem list, medications and allergies. Physical Assessment:   Vitals:   08/02/17 1535  BP: (!) 90/52  Pulse: (!) 102  Weight: 106 lb (48.1 kg)  Body mass index is 19.39 kg/m.        Physical Examination:   General appearance: Well appearing, and in no distress  Mental status: Alert, oriented to person, place, and time  Skin: Warm & dry  Cardiovascular: Normal heart rate noted  Respiratory: Normal respiratory effort, no distress  Abdomen: Soft, gravid, nontender  Pelvic: Cervical exam deferred         Extremities: Edema: None  Fetal Status: Fetal Heart Rate (bpm): +u/s   Movement: Absent    US TV:16+2 wks,cephalic,posterior pl gr 0,normal ovaries bilat,cx 5 cm w/and w/o pressure,SVP of fluid 4.2 cm,fhr 143 bpm  Results for orders placed or performed in visit on 08/02/17 (from the past 24 hour(s))  POCT urinalysis dipstick   Collection Time: 08/02/17  3:37 PM  Result Value Ref Range   Color, UA     Clarity, UA     Glucose, UA neg    Bilirubin, UA     Ketones, UA neg    Spec Grav, UA  1.010 - 1.025   Blood, UA neg    pH, UA  5.0 -  8.0   Protein, UA neg    Urobilinogen, UA  0.2 or 1.0 E.U./dL   Nitrite, UA neg    Leukocytes, UA Negative Negative   Appearance     Odor      Assessment & Plan:  1) Low-risk pregnancy [id number] at 2136w2d with an Estimated Date of Delivery: 01/15/18   2) Uterine didelphys, preg Rt horn  3) Prev c/s fro ?mild AFE, FTP> per LHE, will need RCS  4) H/O cervical incompetence> will get q2wk CL from 16-24wks, 5cm today  5) H/O PTB @ 36wks d/t PPROM> Makena weekly   Meds:  Meds ordered this encounter  Medications  . HYDROXYprogesterone Caproate SOAJ 275 mg   Labs/procedures today: 2nd IT, CL u/s  Plan:  Continue routine obstetrical care   Reviewed: Preterm labor symptoms and general obstetric precautions including but not limited to vaginal bleeding, contractions, leaking of fluid and fetal movement were reviewed in detail with the patient.  All questions were answered  Follow-up: Return in about 2 weeks (around 08/16/2017) for 17P, LROB, [id number]:Anatomy, Weekly 17P.  Orders Placed This Encounter  Procedures  . US OB Comp + 14 Wk  . INTEGRATED 2  . POCT urinalysis dipstick   Cheral MarkerKimberly R Booker CNM, [facility]NP-BC 08/02/2017 3:58 PM

## 2017-08-02 NOTE — Patient Instructions (Signed)
Sheila [name], I greatly value your feedback.  If you receive a survey following your visit with us today, we appreciate you taking the time to fill it out.  Thanks, Kim Kenae Lindquist, CNM, WHNP-BC   Second Trimester of Pregnancy The second trimester is from week 14 through week 27 (months 4 through 6). The second trimester is often a time when you feel your best. Your body has adjusted to being pregnant, and you begin to feel better physically. Usually, morning sickness has lessened or quit completely, you may have more energy, and you may have an increase in appetite. The second trimester is also a time when the fetus is growing rapidly. At the end of the sixth month, the fetus is about 9 inches long and weighs about 1 pounds. You will likely begin to feel the baby move (quickening) between 16 and 20 weeks of pregnancy. Body changes during your second trimester Your body continues to go through many changes during your second trimester. The changes vary from woman to woman.  Your weight will continue to increase. You will notice your lower abdomen bulging out.  You may begin to get stretch marks on your hips, abdomen, and breasts.  You may develop headaches that can be relieved by medicines. The medicines should be approved by your health care provider.  You may urinate more often because the fetus is pressing on your bladder.  You may develop or continue to have heartburn as a result of your pregnancy.  You may develop constipation because certain hormones are causing the muscles that push waste through your intestines to slow down.  You may develop hemorrhoids or swollen, bulging veins (varicose veins).  You may have back pain. This is caused by: ? Weight gain. ? Pregnancy hormones that are relaxing the joints in your pelvis. ? A shift in weight and the muscles that support your balance.  Your breasts will continue to grow and they will continue to become tender.  Your gums may bleed  and may be sensitive to brushing and flossing.  Dark spots or blotches (chloasma, mask of pregnancy) may develop on your face. This will likely fade after the baby is born.  A dark line from your belly button to the pubic area (linea nigra) may appear. This will likely fade after the baby is born.  You may have changes in your hair. These can include thickening of your hair, rapid growth, and changes in texture. Some women also have hair loss during or after pregnancy, or hair that feels dry or thin. Your hair will most likely return to normal after your baby is born.  What to expect at prenatal visits During a routine prenatal visit:  You will be weighed to make sure you and the fetus are growing normally.  Your blood pressure will be taken.  Your abdomen will be measured to track your baby's growth.  The fetal heartbeat will be listened to.  Any test results from the previous visit will be discussed.  Your health care provider may ask you:  How you are feeling.  If you are feeling the baby move.  If you have had any abnormal symptoms, such as leaking fluid, bleeding, severe headaches, or abdominal cramping.  If you are using any tobacco products, including cigarettes, chewing tobacco, and electronic cigarettes.  If you have any questions.  Other tests that may be performed during your second trimester include:  Blood tests that check for: ? Low iron levels (anemia). ? High   blood sugar that affects pregnant women (gestational diabetes) between 3 and 28 weeks. ? Rh antibodies. This is to check for a protein on red blood cells (Rh factor).  Urine tests to check for infections, diabetes, or protein in the urine.  An ultrasound to confirm the proper growth and development of the baby.  An amniocentesis to check for possible genetic problems.  Fetal screens for spina bifida and Down syndrome.  HIV (human immunodeficiency virus) testing. Routine prenatal testing includes  screening for HIV, unless you choose not to have this test.  Follow these instructions at home: Medicines  Follow your health care provider's instructions regarding medicine use. Specific medicines may be either safe or unsafe to take during pregnancy.  Take a prenatal vitamin that contains at least 600 micrograms (mcg) of folic acid.  If you develop constipation, try taking a stool softener if your health care provider approves. Eating and drinking  Eat a balanced diet that includes fresh fruits and vegetables, whole grains, good sources of protein such as meat, eggs, or tofu, and low-fat dairy. Your health care provider will help you determine the amount of weight gain that is right for you.  Avoid raw meat and uncooked cheese. These carry germs that can cause birth defects in the baby.  If you have low calcium intake from food, talk to your health care provider about whether you should take a daily calcium supplement.  Limit foods that are high in fat and processed sugars, such as fried and sweet foods.  To prevent constipation: ? Drink enough fluid to keep your urine clear or pale yellow. ? Eat foods that are high in fiber, such as fresh fruits and vegetables, whole grains, and beans. Activity  Exercise only as directed by your health care provider. Most women can continue their usual exercise routine during pregnancy. Try to exercise for 30 minutes at least 5 days a week. Stop exercising if you experience uterine contractions.  Avoid heavy lifting, wear low heel shoes, and practice good posture.  A sexual relationship may be continued unless your health care provider directs you otherwise. Relieving pain and discomfort  Wear a good support bra to prevent discomfort from breast tenderness.  Take warm sitz baths to soothe any pain or discomfort caused by hemorrhoids. Use hemorrhoid cream if your health care provider approves.  Rest with your legs elevated if you have leg cramps  or low back pain.  If you develop varicose veins, wear support hose. Elevate your feet for 15 minutes, 3-4 times a day. Limit salt in your diet. Prenatal Care  Write down your questions. Take them to your prenatal visits.  Keep all your prenatal visits as told by your health care provider. This is important. Safety  Wear your seat belt at all times when driving.  Make a list of emergency phone numbers, including numbers for family, friends, the hospital, and police and fire departments. General instructions  Ask your health care provider for a referral to a local prenatal education class. Begin classes no later than the beginning of month 6 of your pregnancy.  Ask for help if you have counseling or nutritional needs during pregnancy. Your health care provider can offer advice or refer you to specialists for help with various needs.  Do not use hot tubs, steam rooms, or saunas.  Do not douche or use tampons or scented sanitary pads.  Do not cross your legs for long periods of time.  Avoid cat litter boxes and soil  used by cats. These carry germs that can cause birth defects in the baby and possibly loss of the fetus by miscarriage or stillbirth.  Avoid all smoking, herbs, alcohol, and unprescribed drugs. Chemicals in these products can affect the formation and growth of the baby.  Do not use any products that contain nicotine or tobacco, such as cigarettes and e-cigarettes. If you need help quitting, ask your health care provider.  Visit your dentist if you have not gone yet during your pregnancy. Use a soft toothbrush to brush your teeth and be gentle when you floss. Contact a health care provider if:  You have dizziness.  You have mild pelvic cramps, pelvic pressure, or nagging pain in the abdominal area.  You have persistent nausea, vomiting, or diarrhea.  You have a bad smelling vaginal discharge.  You have pain when you urinate. Get help right away if:  You have a  fever.  You are leaking fluid from your vagina.  You have spotting or bleeding from your vagina.  You have severe abdominal cramping or pain.  You have rapid weight gain or weight loss.  You have shortness of breath with chest pain.  You notice sudden or extreme swelling of your face, hands, ankles, feet, or legs.  You have not felt your baby move in over an hour.  You have severe headaches that do not go away when you take medicine.  You have vision changes. Summary  The second trimester is from week 14 through week 27 (months 4 through 6). It is also a time when the fetus is growing rapidly.  Your body goes through many changes during pregnancy. The changes vary from woman to woman.  Avoid all smoking, herbs, alcohol, and unprescribed drugs. These chemicals affect the formation and growth your baby.  Do not use any tobacco products, such as cigarettes, chewing tobacco, and e-cigarettes. If you need help quitting, ask your health care provider.  Contact your health care provider if you have any questions. Keep all prenatal visits as told by your health care provider. This is important. This information is not intended to replace advice given to you by your health care provider. Make sure you discuss any questions you have with your health care provider. Document Released: 05/12/2001 Document Revised: 10/24/2015 Document Reviewed: 07/19/2012 Elsevier Interactive Patient Education  2017 [employer].

## 2017-08-02 NOTE — Progress Notes (Signed)
US TV:16+2 wks,cephalic,posterior pl gr 0,normal ovaries bilat,cx 5 cm w/and w/o pressure,SVP of fluid 4.2 cm,fhr 143 bpm

## 2017-08-02 NOTE — Progress Notes (Signed)
Pt given Makena 275mg SQ right arm without complications. Advised pt to return in 1 week for next injection.  

## 2017-08-06 LAB — INTEGRATED 2
AFP MARKER: 62.7 ng/mL
AFP MOM: 1.77
CROWN RUMP LENGTH: 67.7 mm
DIA MoM: 2.07
DIA Value: 442.7 pg/mL
ESTRIOL UNCONJUGATED: 1.26 ng/mL
GEST. AGE ON COLLECTION DATE: 12.9 wk
Gestational Age: 15.9 weeks
HCG MOM: 2.85
MATERNAL AGE AT EDD: 26 a
Nuchal Translucency (NT): 2.2 mm
Nuchal Translucency MoM: 1.31
Number of Fetuses: 1
PAPP-A MoM: 1.15
PAPP-A Value: 1891.6 ng/mL
Test Results:: NEGATIVE
WEIGHT: 108 [lb_av]
Weight: 108 [lb_av]
hCG Value: 120.1 IU/mL
uE3 MoM: 1.54

## 2017-08-09 ENCOUNTER — Encounter: Payer: Self-pay | Admitting: *Deleted

## 2017-08-09 ENCOUNTER — Ambulatory Visit (INDEPENDENT_AMBULATORY_CARE_PROVIDER_SITE_OTHER): Payer: Medicaid Other | Admitting: *Deleted

## 2017-08-09 VITALS — BP 108/60 | HR 114 | Ht 63.0 in | Wt 110.0 lb

## 2017-08-09 DIAGNOSIS — Z1389 Encounter for screening for other disorder: Secondary | ICD-10-CM

## 2017-08-09 DIAGNOSIS — O09892 Supervision of other high risk pregnancies, second trimester: Secondary | ICD-10-CM

## 2017-08-09 DIAGNOSIS — Z331 Pregnant state, incidental: Secondary | ICD-10-CM | POA: Diagnosis not present

## 2017-08-09 DIAGNOSIS — Z3A17 17 weeks gestation of pregnancy: Secondary | ICD-10-CM

## 2017-08-09 DIAGNOSIS — O09212 Supervision of pregnancy with history of pre-term labor, second trimester: Secondary | ICD-10-CM

## 2017-08-09 DIAGNOSIS — Z3482 Encounter for supervision of other normal pregnancy, second trimester: Secondary | ICD-10-CM

## 2017-08-09 LAB — POCT URINALYSIS DIPSTICK
GLUCOSE UA: NEGATIVE
KETONES UA: NEGATIVE
Nitrite, UA: NEGATIVE
Protein, UA: NEGATIVE
RBC UA: NEGATIVE

## 2017-08-09 NOTE — Progress Notes (Signed)
Pt here for 17P. Pt tolerated shot well. Return in 1 week for next shot. JSY 

## 2017-08-13 ENCOUNTER — Other Ambulatory Visit: Payer: Self-pay | Admitting: Women's Health

## 2017-08-13 DIAGNOSIS — Z363 Encounter for antenatal screening for malformations: Secondary | ICD-10-CM

## 2017-08-13 DIAGNOSIS — Z3482 Encounter for supervision of other normal pregnancy, second trimester: Secondary | ICD-10-CM

## 2017-08-13 DIAGNOSIS — O09299 Supervision of pregnancy with other poor reproductive or obstetric history, unspecified trimester: Secondary | ICD-10-CM

## 2017-08-16 ENCOUNTER — Ambulatory Visit (INDEPENDENT_AMBULATORY_CARE_PROVIDER_SITE_OTHER): Payer: Medicaid Other | Admitting: Women's Health

## 2017-08-16 ENCOUNTER — Encounter: Payer: Self-pay | Admitting: Women's Health

## 2017-08-16 ENCOUNTER — Ambulatory Visit (INDEPENDENT_AMBULATORY_CARE_PROVIDER_SITE_OTHER): Payer: Medicaid Other

## 2017-08-16 VITALS — BP 102/60 | HR 115 | Wt 109.0 lb

## 2017-08-16 DIAGNOSIS — O09212 Supervision of pregnancy with history of pre-term labor, second trimester: Secondary | ICD-10-CM

## 2017-08-16 DIAGNOSIS — Z363 Encounter for antenatal screening for malformations: Secondary | ICD-10-CM

## 2017-08-16 DIAGNOSIS — Z3A18 18 weeks gestation of pregnancy: Secondary | ICD-10-CM | POA: Diagnosis not present

## 2017-08-16 DIAGNOSIS — O09892 Supervision of other high risk pregnancies, second trimester: Secondary | ICD-10-CM

## 2017-08-16 DIAGNOSIS — O3492 Maternal care for abnormality of pelvic organ, unspecified, second trimester: Secondary | ICD-10-CM

## 2017-08-16 DIAGNOSIS — O09292 Supervision of pregnancy with other poor reproductive or obstetric history, second trimester: Secondary | ICD-10-CM | POA: Diagnosis not present

## 2017-08-16 DIAGNOSIS — Q5110 Doubling of uterus with doubling of cervix and vagina without obstruction: Secondary | ICD-10-CM

## 2017-08-16 DIAGNOSIS — Z1389 Encounter for screening for other disorder: Secondary | ICD-10-CM

## 2017-08-16 DIAGNOSIS — O09299 Supervision of pregnancy with other poor reproductive or obstetric history, unspecified trimester: Secondary | ICD-10-CM

## 2017-08-16 DIAGNOSIS — Z331 Pregnant state, incidental: Secondary | ICD-10-CM

## 2017-08-16 DIAGNOSIS — O34219 Maternal care for unspecified type scar from previous cesarean delivery: Secondary | ICD-10-CM

## 2017-08-16 DIAGNOSIS — Z3482 Encounter for supervision of other normal pregnancy, second trimester: Secondary | ICD-10-CM

## 2017-08-16 LAB — POCT URINALYSIS DIPSTICK
Glucose, UA: NEGATIVE
KETONES UA: NEGATIVE
Leukocytes, UA: NEGATIVE
Nitrite, UA: NEGATIVE
Protein, UA: NEGATIVE
RBC UA: NEGATIVE

## 2017-08-16 NOTE — Patient Instructions (Signed)
Sheila [name], I greatly value your feedback.  If you receive a survey following your visit with us today, we appreciate you taking the time to fill it out.  Thanks, Joellyn HaffKim Glendine Swetz, CNM, WHNP-BC   Second Trimester of Pregnancy The second trimester is from week 14 through week 27 (months 4 through 6). The second trimester is often a time when you feel your best. Your body has adjusted to being pregnant, and you begin to feel better physically. Usually, morning sickness has lessened or quit completely, you may have more energy, and you may have an increase in appetite. The second trimester is also a time when the fetus is growing rapidly. At the end of the sixth month, the fetus is about 9 inches long and weighs about 1 pounds. You will likely begin to feel the baby move (quickening) between 16 and 20 weeks of pregnancy. Body changes during your second trimester Your body continues to go through many changes during your second trimester. The changes vary from woman to woman.  Your weight will continue to increase. You will notice your lower abdomen bulging out.  You may begin to get stretch marks on your hips, abdomen, and breasts.  You may develop headaches that can be relieved by medicines. The medicines should be approved by your health care provider.  You may urinate more often because the fetus is pressing on your bladder.  You may develop or continue to have heartburn as a result of your pregnancy.  You may develop constipation because certain hormones are causing the muscles that push waste through your intestines to slow down.  You may develop hemorrhoids or swollen, bulging veins (varicose veins).  You may have back pain. This is caused by: ? Weight gain. ? Pregnancy hormones that are relaxing the joints in your pelvis. ? A shift in weight and the muscles that support your balance.  Your breasts will continue to grow and they will continue to become tender.  Your gums may bleed  and may be sensitive to brushing and flossing.  Dark spots or blotches (chloasma, mask of pregnancy) may develop on your face. This will likely fade after the baby is born.  A dark line from your belly button to the pubic area (linea nigra) may appear. This will likely fade after the baby is born.  You may have changes in your hair. These can include thickening of your hair, rapid growth, and changes in texture. Some women also have hair loss during or after pregnancy, or hair that feels dry or thin. Your hair will most likely return to normal after your baby is born.  What to expect at prenatal visits During a routine prenatal visit:  You will be weighed to make sure you and the fetus are growing normally.  Your blood pressure will be taken.  Your abdomen will be measured to track your baby's growth.  The fetal heartbeat will be listened to.  Any test results from the previous visit will be discussed.  Your health care provider may ask you:  How you are feeling.  If you are feeling the baby move.  If you have had any abnormal symptoms, such as leaking fluid, bleeding, severe headaches, or abdominal cramping.  If you are using any tobacco products, including cigarettes, chewing tobacco, and electronic cigarettes.  If you have any questions.  Other tests that may be performed during your second trimester include:  Blood tests that check for: ? Low iron levels (anemia). ? High  blood sugar that affects pregnant women (gestational diabetes) between 3 and 28 weeks. ? Rh antibodies. This is to check for a protein on red blood cells (Rh factor).  Urine tests to check for infections, diabetes, or protein in the urine.  An ultrasound to confirm the proper growth and development of the baby.  An amniocentesis to check for possible genetic problems.  Fetal screens for spina bifida and Down syndrome.  HIV (human immunodeficiency virus) testing. Routine prenatal testing includes  screening for HIV, unless you choose not to have this test.  Follow these instructions at home: Medicines  Follow your health care provider's instructions regarding medicine use. Specific medicines may be either safe or unsafe to take during pregnancy.  Take a prenatal vitamin that contains at least 600 micrograms (mcg) of folic acid.  If you develop constipation, try taking a stool softener if your health care provider approves. Eating and drinking  Eat a balanced diet that includes fresh fruits and vegetables, whole grains, good sources of protein such as meat, eggs, or tofu, and low-fat dairy. Your health care provider will help you determine the amount of weight gain that is right for you.  Avoid raw meat and uncooked cheese. These carry germs that can cause birth defects in the baby.  If you have low calcium intake from food, talk to your health care provider about whether you should take a daily calcium supplement.  Limit foods that are high in fat and processed sugars, such as fried and sweet foods.  To prevent constipation: ? Drink enough fluid to keep your urine clear or pale yellow. ? Eat foods that are high in fiber, such as fresh fruits and vegetables, whole grains, and beans. Activity  Exercise only as directed by your health care provider. Most women can continue their usual exercise routine during pregnancy. Try to exercise for 30 minutes at least 5 days a week. Stop exercising if you experience uterine contractions.  Avoid heavy lifting, wear low heel shoes, and practice good posture.  A sexual relationship may be continued unless your health care provider directs you otherwise. Relieving pain and discomfort  Wear a good support bra to prevent discomfort from breast tenderness.  Take warm sitz baths to soothe any pain or discomfort caused by hemorrhoids. Use hemorrhoid cream if your health care provider approves.  Rest with your legs elevated if you have leg cramps  or low back pain.  If you develop varicose veins, wear support hose. Elevate your feet for 15 minutes, 3-4 times a day. Limit salt in your diet. Prenatal Care  Write down your questions. Take them to your prenatal visits.  Keep all your prenatal visits as told by your health care provider. This is important. Safety  Wear your seat belt at all times when driving.  Make a list of emergency phone numbers, including numbers for family, friends, the hospital, and police and fire departments. General instructions  Ask your health care provider for a referral to a local prenatal education class. Begin classes no later than the beginning of month 6 of your pregnancy.  Ask for help if you have counseling or nutritional needs during pregnancy. Your health care provider can offer advice or refer you to specialists for help with various needs.  Do not use hot tubs, steam rooms, or saunas.  Do not douche or use tampons or scented sanitary pads.  Do not cross your legs for long periods of time.  Avoid cat litter boxes and soil  used by cats. These carry germs that can cause birth defects in the baby and possibly loss of the fetus by miscarriage or stillbirth.  Avoid all smoking, herbs, alcohol, and unprescribed drugs. Chemicals in these products can affect the formation and growth of the baby.  Do not use any products that contain nicotine or tobacco, such as cigarettes and e-cigarettes. If you need help quitting, ask your health care provider.  Visit your dentist if you have not gone yet during your pregnancy. Use a soft toothbrush to brush your teeth and be gentle when you floss. Contact a health care provider if:  You have dizziness.  You have mild pelvic cramps, pelvic pressure, or nagging pain in the abdominal area.  You have persistent nausea, vomiting, or diarrhea.  You have a bad smelling vaginal discharge.  You have pain when you urinate. Get help right away if:  You have a  fever.  You are leaking fluid from your vagina.  You have spotting or bleeding from your vagina.  You have severe abdominal cramping or pain.  You have rapid weight gain or weight loss.  You have shortness of breath with chest pain.  You notice sudden or extreme swelling of your face, hands, ankles, feet, or legs.  You have not felt your baby move in over an hour.  You have severe headaches that do not go away when you take medicine.  You have vision changes. Summary  The second trimester is from week 14 through week 27 (months 4 through 6). It is also a time when the fetus is growing rapidly.  Your body goes through many changes during pregnancy. The changes vary from woman to woman.  Avoid all smoking, herbs, alcohol, and unprescribed drugs. These chemicals affect the formation and growth your baby.  Do not use any tobacco products, such as cigarettes, chewing tobacco, and e-cigarettes. If you need help quitting, ask your health care provider.  Contact your health care provider if you have any questions. Keep all prenatal visits as told by your health care provider. This is important. This information is not intended to replace advice given to you by your health care provider. Make sure you discuss any questions you have with your health care provider. Document Released: 05/12/2001 Document Revised: 10/24/2015 Document Reviewed: 07/19/2012 Elsevier Interactive Patient Education  2017 [employer].

## 2017-08-16 NOTE — Progress Notes (Signed)
Pt received 17P shot. Pt tolerated shot well. Return in 1 week for 17P. JSY

## 2017-08-16 NOTE — Progress Notes (Signed)
US 18+2 wks,cephalic,cx 5.4 cm,posterior pl gr 0,normal ovaries bilat,svp of fluid 4.4 cm,fhr 146,EFW 243 g anatomy complete,no obvious abnormalities

## 2017-08-16 NOTE — Progress Notes (Signed)
   LOW-RISK PREGNANCY VISIT Patient name: Sheila [name] Nored MRN [id number]  Date of birth: 08/15/1990 Chief Complaint:   Routine Prenatal Visit (US today; 17P today)  History of Present Illness:   Sheila [name] Conery is a 27 y.o. [id number] female at 9252w2d with an Estimated Date of Delivery: 01/15/18 being seen today for ongoing management of a low-risk pregnancy. H/O PTB d/t PPROM, cervical incompetence.  Today she reports no complaints. Contractions: Not present. Vag. Bleeding: None.  Movement: Present. denies leaking of fluid. Review of Systems:   Pertinent items are noted in HPI Denies abnormal vaginal discharge w/ itching/odor/irritation, headaches, visual changes, shortness of breath, chest pain, abdominal pain, severe nausea/vomiting, or problems with urination or bowel movements unless otherwise stated above. Pertinent History Reviewed:  Reviewed past medical,surgical, social, obstetrical and family history.  Reviewed problem list, medications and allergies. Physical Assessment:   Vitals:   08/16/17 1601  BP: 102/60  Pulse: (!) 115  Weight: 109 lb (49.4 kg)  Body mass index is 19.31 kg/m.        Physical Examination:   General appearance: Well appearing, and in no distress  Mental status: Alert, oriented to person, place, and time  Skin: Warm & dry  Cardiovascular: Normal heart rate noted  Respiratory: Normal respiratory effort, no distress  Abdomen: Soft, gravid, nontender  Pelvic: Cervical exam deferred         Extremities: Edema: None  Fetal Status: Fetal Heart Rate (bpm): 146 u/s   Movement: Present    Results for orders placed or performed in visit on 08/16/17 (from the past 24 hour(s))  POCT urinalysis dipstick   Collection Time: 08/16/17  4:03 PM  Result Value Ref Range   Color, UA     Clarity, UA     Glucose, UA neg    Bilirubin, UA     Ketones, UA neg    Spec Grav, UA  1.010 - 1.025   Blood, UA neg    pH, UA  5.0 - 8.0   Protein, UA neg    Urobilinogen, UA  0.2  or 1.0 E.U./dL   Nitrite, UA neg    Leukocytes, UA Negative Negative   Appearance     Odor      Assessment & Plan:  1) Low-risk pregnancy [id number] at 4952w2d with an Estimated Date of Delivery: 01/15/18   2) H/O PTB @ 36wks d/t PPROM, Makena weekly  3) 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 Mild AFE, FTP> per LHE, will need RCS  5) H/O cervical incompetence> will get q2wk CL from 16-24wks, 5+cm today   Meds: No orders of the defined types were placed in this encounter.  Labs/procedures today: CL u/s, anatomy u/s, Makena  Plan:  Continue routine obstetrical care   Reviewed: Preterm labor symptoms and general obstetric precautions including but not limited to vaginal bleeding, contractions, leaking of fluid and fetal movement were reviewed in detail with the patient.  All questions were answered  Follow-up: Return in about 2 weeks (around 08/30/2017) for LROB, US:OB F/U cervix, , Weekly 17P.  Orders Placed This Encounter  Procedures  . POCT urinalysis dipstick   Cheral MarkerKimberly R Breckyn Ticas CNM, [facility]edoWHNP-BC 08/16/2017 5:02 PM

## 2017-08-23 ENCOUNTER — Ambulatory Visit (INDEPENDENT_AMBULATORY_CARE_PROVIDER_SITE_OTHER): Payer: Medicaid Other | Admitting: *Deleted

## 2017-08-23 ENCOUNTER — Encounter: Payer: Self-pay | Admitting: *Deleted

## 2017-08-23 VITALS — BP 98/60 | HR 103 | Ht 63.0 in | Wt 113.0 lb

## 2017-08-23 DIAGNOSIS — O09212 Supervision of pregnancy with history of pre-term labor, second trimester: Secondary | ICD-10-CM

## 2017-08-23 DIAGNOSIS — Z1389 Encounter for screening for other disorder: Secondary | ICD-10-CM

## 2017-08-23 DIAGNOSIS — Z331 Pregnant state, incidental: Secondary | ICD-10-CM | POA: Diagnosis not present

## 2017-08-23 DIAGNOSIS — Z3A19 19 weeks gestation of pregnancy: Secondary | ICD-10-CM

## 2017-08-23 DIAGNOSIS — Z3482 Encounter for supervision of other normal pregnancy, second trimester: Secondary | ICD-10-CM

## 2017-08-23 DIAGNOSIS — O09892 Supervision of other high risk pregnancies, second trimester: Secondary | ICD-10-CM

## 2017-08-23 LAB — POCT URINALYSIS DIPSTICK
Glucose, UA: NEGATIVE
KETONES UA: NEGATIVE
NITRITE UA: NEGATIVE
PROTEIN UA: NEGATIVE
RBC UA: NEGATIVE

## 2017-08-23 NOTE — Progress Notes (Signed)
Pt here for 17P. Pt tolerated shot well. Return in 1 week for next shot. JSY 

## 2017-08-27 ENCOUNTER — Other Ambulatory Visit: Payer: Self-pay | Admitting: Obstetrics and Gynecology

## 2017-08-27 DIAGNOSIS — Z0375 Encounter for suspected cervical shortening ruled out: Secondary | ICD-10-CM

## 2017-08-30 ENCOUNTER — Ambulatory Visit (INDEPENDENT_AMBULATORY_CARE_PROVIDER_SITE_OTHER): Payer: Medicaid Other | Admitting: Obstetrics & Gynecology

## 2017-08-30 ENCOUNTER — Encounter: Payer: Self-pay | Admitting: Obstetrics & Gynecology

## 2017-08-30 ENCOUNTER — Ambulatory Visit (INDEPENDENT_AMBULATORY_CARE_PROVIDER_SITE_OTHER): Payer: Medicaid Other

## 2017-08-30 VITALS — BP 96/60 | HR 104 | Wt 114.5 lb

## 2017-08-30 DIAGNOSIS — Z3A20 20 weeks gestation of pregnancy: Secondary | ICD-10-CM

## 2017-08-30 DIAGNOSIS — Z1389 Encounter for screening for other disorder: Secondary | ICD-10-CM

## 2017-08-30 DIAGNOSIS — O09212 Supervision of pregnancy with history of pre-term labor, second trimester: Secondary | ICD-10-CM

## 2017-08-30 DIAGNOSIS — Z3482 Encounter for supervision of other normal pregnancy, second trimester: Secondary | ICD-10-CM

## 2017-08-30 DIAGNOSIS — Z331 Pregnant state, incidental: Secondary | ICD-10-CM

## 2017-08-30 DIAGNOSIS — Z0375 Encounter for suspected cervical shortening ruled out: Secondary | ICD-10-CM

## 2017-08-30 DIAGNOSIS — O3492 Maternal care for abnormality of pelvic organ, unspecified, second trimester: Secondary | ICD-10-CM

## 2017-08-30 DIAGNOSIS — Z3402 Encounter for supervision of normal first pregnancy, second trimester: Secondary | ICD-10-CM

## 2017-08-30 DIAGNOSIS — Q5110 Doubling of uterus with doubling of cervix and vagina without obstruction: Secondary | ICD-10-CM

## 2017-08-30 LAB — POCT URINALYSIS DIPSTICK
Glucose, UA: NEGATIVE
PROTEIN UA: NEGATIVE

## 2017-08-30 NOTE — Progress Notes (Signed)
US 20+2 wks,uterine didelphys,fetus in right horn,cx length right 4.8 cm, cx length left 4.8 with and without pressure,cephalic,posterior pl gr 0,normal ovaries bilat,fhr 152 bpm,svp of fluid 5.2 cm,

## 2017-08-30 NOTE — Progress Notes (Signed)
[id number] 5142w2d Estimated Date of Delivery: 01/15/18  Blood pressure 96/60, pulse (!) 104, weight 114 lb 8 oz (51.9 kg), last menstrual period 03/30/2017.   BP weight and urine results all reviewed and noted.  Please refer to the obstetrical flow sheet for the fundal height and fetal heart rate documentation:  Patient reports good fetal movement, denies any bleeding and no rupture of membranes symptoms or regular contractions. Patient is without complaints. All questions were answered.  Orders Placed This Encounter  Procedures  . 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

## 2017-09-06 ENCOUNTER — Encounter: Payer: Self-pay | Admitting: *Deleted

## 2017-09-06 ENCOUNTER — Ambulatory Visit (INDEPENDENT_AMBULATORY_CARE_PROVIDER_SITE_OTHER): Payer: Medicaid Other | Admitting: *Deleted

## 2017-09-06 DIAGNOSIS — Z1389 Encounter for screening for other disorder: Secondary | ICD-10-CM

## 2017-09-06 DIAGNOSIS — Z331 Pregnant state, incidental: Secondary | ICD-10-CM | POA: Diagnosis not present

## 2017-09-06 DIAGNOSIS — O09212 Supervision of pregnancy with history of pre-term labor, second trimester: Secondary | ICD-10-CM

## 2017-09-06 DIAGNOSIS — Z3A21 21 weeks gestation of pregnancy: Secondary | ICD-10-CM

## 2017-09-06 DIAGNOSIS — Z3482 Encounter for supervision of other normal pregnancy, second trimester: Secondary | ICD-10-CM

## 2017-09-06 LAB — POCT URINALYSIS DIPSTICK
GLUCOSE UA: NEGATIVE
Ketones, UA: NEGATIVE
LEUKOCYTES UA: NEGATIVE
Nitrite, UA: NEGATIVE
Protein, UA: NEGATIVE
RBC UA: NEGATIVE

## 2017-09-06 NOTE — Progress Notes (Signed)
Pt here for 17P. Pt tolerated shot well. Return in 1 week for next shot. JSY 

## 2017-09-13 ENCOUNTER — Ambulatory Visit (INDEPENDENT_AMBULATORY_CARE_PROVIDER_SITE_OTHER): Payer: Medicaid Other | Admitting: *Deleted

## 2017-09-13 VITALS — BP 102/50 | HR 76 | Wt 117.5 lb

## 2017-09-13 DIAGNOSIS — O09212 Supervision of pregnancy with history of pre-term labor, second trimester: Secondary | ICD-10-CM | POA: Diagnosis not present

## 2017-09-13 DIAGNOSIS — Z1389 Encounter for screening for other disorder: Secondary | ICD-10-CM

## 2017-09-13 DIAGNOSIS — Z3A22 22 weeks gestation of pregnancy: Secondary | ICD-10-CM

## 2017-09-13 DIAGNOSIS — Z331 Pregnant state, incidental: Secondary | ICD-10-CM

## 2017-09-13 LAB — POCT URINALYSIS DIPSTICK
Blood, UA: NEGATIVE
Glucose, UA: NEGATIVE
KETONES UA: NEGATIVE
Leukocytes, UA: NEGATIVE
NITRITE UA: NEGATIVE
PROTEIN UA: NEGATIVE

## 2017-09-13 NOTE — Progress Notes (Signed)
Pt in for Makena injection. Given in right arm.

## 2017-09-20 ENCOUNTER — Ambulatory Visit (INDEPENDENT_AMBULATORY_CARE_PROVIDER_SITE_OTHER): Payer: Medicaid Other | Admitting: *Deleted

## 2017-09-20 VITALS — BP 118/70 | HR 76 | Ht 62.0 in | Wt 124.0 lb

## 2017-09-20 DIAGNOSIS — O09212 Supervision of pregnancy with history of pre-term labor, second trimester: Secondary | ICD-10-CM

## 2017-09-20 DIAGNOSIS — Z331 Pregnant state, incidental: Secondary | ICD-10-CM | POA: Diagnosis not present

## 2017-09-20 DIAGNOSIS — Z3A23 23 weeks gestation of pregnancy: Secondary | ICD-10-CM | POA: Diagnosis not present

## 2017-09-20 DIAGNOSIS — Z1389 Encounter for screening for other disorder: Secondary | ICD-10-CM

## 2017-09-20 LAB — POCT URINALYSIS DIPSTICK
Blood, UA: NEGATIVE
GLUCOSE UA: NEGATIVE
KETONES UA: NEGATIVE
Leukocytes, UA: NEGATIVE
Nitrite, UA: NEGATIVE
Protein, UA: NEGATIVE

## 2017-09-20 NOTE — Progress Notes (Addendum)
Pt in for Makena injection. Given in left arm. Patient tolerated well. Reports no complaints, baby is moving well. Pt to return in one week for next dose. Called the compounding pharmacy for refill on makena autoinjectors they will be shipped to the office before next Monday.

## 2017-09-27 ENCOUNTER — Ambulatory Visit (INDEPENDENT_AMBULATORY_CARE_PROVIDER_SITE_OTHER): Payer: Medicaid Other | Admitting: Women's Health

## 2017-09-27 ENCOUNTER — Encounter: Payer: Self-pay | Admitting: Women's Health

## 2017-09-27 VITALS — BP 110/50 | HR 98 | Wt 122.2 lb

## 2017-09-27 DIAGNOSIS — O09212 Supervision of pregnancy with history of pre-term labor, second trimester: Secondary | ICD-10-CM | POA: Diagnosis not present

## 2017-09-27 DIAGNOSIS — Z1389 Encounter for screening for other disorder: Secondary | ICD-10-CM

## 2017-09-27 DIAGNOSIS — Z331 Pregnant state, incidental: Secondary | ICD-10-CM

## 2017-09-27 DIAGNOSIS — Z3A24 24 weeks gestation of pregnancy: Secondary | ICD-10-CM

## 2017-09-27 DIAGNOSIS — O09299 Supervision of pregnancy with other poor reproductive or obstetric history, unspecified trimester: Secondary | ICD-10-CM

## 2017-09-27 DIAGNOSIS — O3402 Maternal care for unspecified congenital malformation of uterus, second trimester: Secondary | ICD-10-CM

## 2017-09-27 DIAGNOSIS — Q512 Other doubling of uterus, unspecified: Secondary | ICD-10-CM

## 2017-09-27 DIAGNOSIS — O09892 Supervision of other high risk pregnancies, second trimester: Secondary | ICD-10-CM

## 2017-09-27 DIAGNOSIS — Z3482 Encounter for supervision of other normal pregnancy, second trimester: Secondary | ICD-10-CM

## 2017-09-27 DIAGNOSIS — O34219 Maternal care for unspecified type scar from previous cesarean delivery: Secondary | ICD-10-CM

## 2017-09-27 LAB — POCT URINALYSIS DIPSTICK
Blood, UA: NEGATIVE
GLUCOSE UA: NEGATIVE
KETONES UA: NEGATIVE
Leukocytes, UA: NEGATIVE
NITRITE UA: NEGATIVE
PROTEIN UA: NEGATIVE

## 2017-09-27 MED ORDER — HYDROXYPROGESTERONE CAPROATE 275 MG/1.1ML ~~LOC~~ SOAJ
275.0000 mg | Freq: Once | SUBCUTANEOUS | Status: AC
  Administered 2017-09-27: 275 mg via SUBCUTANEOUS

## 2017-09-27 NOTE — Patient Instructions (Signed)
Sheila [name], I greatly value your feedback.  If you receive a survey following your visit with [place] today, we appreciate you taking the time to fill it out.  Thanks, Joellyn Haff, CNM, WHNP-BC   You will have your sugar test next visit.  Please do not eat or drink anything after midnight the night before you come, not even water.  You will be here for at least two hours.     Call the office [phone]) or go to [facility] if:  You begin to have strong, frequent contractions  Your water breaks.  Sometimes it is a big gush of fluid, sometimes it is just a trickle that keeps getting your panties wet or running down your legs  You have vaginal bleeding.  It is normal to have a small amount of spotting if your cervix was checked.   You don't feel your baby moving like normal.  If you don't, get you something to eat and drink and lay down and focus on feeling your baby move.   If your baby is still not moving like normal, you should call the office or go to [facility].  Second Trimester of Pregnancy The second trimester is from week 13 through week 28, months 4 through 6. The second trimester is often a time when you feel your best. Your body has also adjusted to being pregnant, and you begin to feel better physically. Usually, morning sickness has lessened or quit completely, you may have more energy, and you may have an increase in appetite. The second trimester is also a time when the fetus is growing rapidly. At the end of the sixth month, the fetus is about 9 inches long and weighs about 1 pounds. You will likely begin to feel the baby move (quickening) between 18 and 20 weeks of the pregnancy. BODY CHANGES Your body goes through many changes during pregnancy. The changes vary from woman to woman.   Your weight will continue to increase. You will notice your lower abdomen bulging out.  You may begin to get stretch marks on your hips, abdomen, and breasts.  You may develop  headaches that can be relieved by medicines approved by your health care provider.  You may urinate more often because the fetus is pressing on your bladder.  You may develop or continue to have heartburn as a result of your pregnancy.  You may develop constipation because certain hormones are causing the muscles that push waste through your intestines to slow down.  You may develop hemorrhoids or swollen, bulging veins (varicose veins).  You may have back pain because of the weight gain and pregnancy hormones relaxing your joints between the bones in your pelvis and as a result of a shift in weight and the muscles that support your balance.  Your breasts will continue to grow and be tender.  Your gums may bleed and may be sensitive to brushing and flossing.  Dark spots or blotches (chloasma, mask of pregnancy) may develop on your face. This will likely fade after the baby is born.  A dark line from your belly button to the pubic area (linea nigra) may appear. This will likely fade after the baby is born.  You may have changes in your hair. These can include thickening of your hair, rapid growth, and changes in texture. Some women also have hair loss during or after pregnancy, or hair that feels dry or thin. Your hair will most likely return to normal after your  baby is born. WHAT TO EXPECT AT YOUR PRENATAL VISITS During a routine prenatal visit:  You will be weighed to make sure you and the fetus are growing normally.  Your blood pressure will be taken.  Your abdomen will be measured to track your baby's growth.  The fetal heartbeat will be listened to.  Any test results from the previous visit will be discussed. Your health care provider may ask you:  How you are feeling.  If you are feeling the baby move.  If you have had any abnormal symptoms, such as leaking fluid, bleeding, severe headaches, or abdominal cramping.  If you have any questions. Other tests that may be  performed during your second trimester include:  Blood tests that check for:  Low iron levels (anemia).  Gestational diabetes (between 24 and 28 weeks).  Rh antibodies.  Urine tests to check for infections, diabetes, or protein in the urine.  An ultrasound to confirm the proper growth and development of the baby.  An amniocentesis to check for possible genetic problems.  Fetal screens for spina bifida and Down syndrome. HOME CARE INSTRUCTIONS   Avoid all smoking, herbs, alcohol, and unprescribed drugs. These chemicals affect the formation and growth of the baby.  Follow your health care provider's instructions regarding medicine use. There are medicines that are either safe or unsafe to take during pregnancy.  Exercise only as directed by your health care provider. Experiencing uterine cramps is a good sign to stop exercising.  Continue to eat regular, healthy meals.  Wear a good support bra for breast tenderness.  Do not use hot tubs, steam rooms, or saunas.  Wear your seat belt at all times when driving.  Avoid raw meat, uncooked cheese, cat litter boxes, and soil used by cats. These carry germs that can cause birth defects in the baby.  Take your prenatal vitamins.  Try taking a stool softener (if your health care provider approves) if you develop constipation. Eat more high-fiber foods, such as fresh vegetables or fruit and whole grains. Drink plenty of fluids to keep your urine clear or pale yellow.  Take warm sitz baths to soothe any pain or discomfort caused by hemorrhoids. Use hemorrhoid cream if your health care provider approves.  If you develop varicose veins, wear support hose. Elevate your feet for 15 minutes, 3-4 times a day. Limit salt in your diet.  Avoid heavy lifting, wear low heel shoes, and practice good posture.  Rest with your legs elevated if you have leg cramps or low back pain.  Visit your dentist if you have not gone yet during your pregnancy.  Use a soft toothbrush to brush your teeth and be gentle when you floss.  A sexual relationship may be continued unless your health care provider directs you otherwise.  Continue to go to all your prenatal visits as directed by your health care provider. SEEK MEDICAL CARE IF:   You have dizziness.  You have mild pelvic cramps, pelvic pressure, or nagging pain in the abdominal area.  You have persistent nausea, vomiting, or diarrhea.  You have a bad smelling vaginal discharge.  You have pain with urination. SEEK IMMEDIATE MEDICAL CARE IF:   You have a fever.  You are leaking fluid from your vagina.  You have spotting or bleeding from your vagina.  You have severe abdominal cramping or pain.  You have rapid weight gain or loss.  You have shortness of breath with chest pain.  You notice sudden or extreme   swelling of your face, hands, ankles, feet, or legs.  You have not felt your baby move in over an hour.  You have severe headaches that do not go away with medicine.  You have vision changes. Document Released: 05/12/2001 Document Revised: 05/23/2013 Document Reviewed: 07/19/2012 [facility] Patient Information 2015 [place], [place]. This information is not intended to replace advice given to you by your health care provider. Make sure you discuss any questions you have with your health care provider.

## 2017-09-27 NOTE — Progress Notes (Signed)
   LOW-RISK PREGNANCY VISIT Patient name: Sheila [name] MRN [id number]  Date of birth: 04-21-1991 Chief Complaint:   low risk pregnancy (17p)  History of Present Illness:   Sheila [name] is a 27 y.o. [phone] female at [redacted]w[redacted]d with an Estimated Date of Delivery: 01/15/18 being seen today for ongoing management of a low-risk pregnancy.  Today she reports no complaints. Contractions: Not present.  .  Movement: Present. denies leaking of fluid. Review of Systems:   Pertinent items are noted in HPI Denies abnormal vaginal discharge w/ itching/odor/irritation, headaches, visual changes, shortness of breath, chest pain, abdominal pain, severe nausea/vomiting, or problems with urination or bowel movements unless otherwise stated above. Pertinent History Reviewed:  Reviewed past medical,surgical, social, obstetrical and family history.  Reviewed problem list, medications and allergies. Physical Assessment:   Vitals:   09/27/17 1358  BP: (!) 110/50  Pulse: 98  Weight: 122 lb 3.2 oz (55.4 kg)  Body mass index is 22.35 kg/m.        Physical Examination:   General appearance: Well appearing, and in no distress  Mental status: Alert, oriented to person, place, and time  Skin: Warm & dry  Cardiovascular: Normal heart rate noted  Respiratory: Normal respiratory effort, no distress  Abdomen: Soft, gravid, nontender  Pelvic: Cervical exam deferred         Extremities: Edema: None  Fetal Status: Fetal Heart Rate (bpm): 153 Fundal Height: 22 cm Movement: Present    Results for orders placed or performed in visit on 09/27/17 (from the past 24 hour(s))  POCT urinalysis dipstick   Collection Time: 09/27/17  2:05 PM  Result Value Ref Range   Color, UA     Clarity, UA     Glucose, UA neg    Bilirubin, UA     Ketones, UA neg    Spec Grav, UA  1.010 - 1.025   Blood, UA neg    pH, UA  5.0 - 8.0   Protein, UA neg    Urobilinogen, UA  0.2 or 1.0 E.U./dL   Nitrite, UA neg    Leukocytes, UA  Negative Negative   Appearance     Odor      Assessment & Plan:  1) Low-risk pregnancy [id number] at [redacted]w[redacted]d with an Estimated Date of Delivery: 01/15/18   2) Uterine didelphys with vaginal septum, pregnancy Rt horn  3) Prev c/s> for repeat  4) H/O PTB d/t PPROM> Makena weekly, received today   Meds:  Meds ordered this encounter  Medications  . HYDROXYprogesterone Caproate SOAJ 275 mg   Labs/procedures today: Makena  Plan:  Continue routine obstetrical care   Reviewed: Preterm labor symptoms and general obstetric precautions including but not limited to vaginal bleeding, contractions, leaking of fluid and fetal movement were reviewed in detail with the patient.  All questions were answered  Follow-up: Return in about 1 month (around 10/25/2017) for LROB, PN2, Weekly 17P.  Orders Placed This Encounter  Procedures  . POCT urinalysis dipstick   Cheral Marker CNM, [facility] 09/27/2017 2:38 PM

## 2017-10-04 ENCOUNTER — Ambulatory Visit (INDEPENDENT_AMBULATORY_CARE_PROVIDER_SITE_OTHER): Payer: Medicaid Other

## 2017-10-04 VITALS — BP 110/60 | HR 93 | Ht 63.0 in | Wt 123.8 lb

## 2017-10-04 DIAGNOSIS — Z1389 Encounter for screening for other disorder: Secondary | ICD-10-CM

## 2017-10-04 DIAGNOSIS — Z331 Pregnant state, incidental: Secondary | ICD-10-CM

## 2017-10-04 DIAGNOSIS — O09212 Supervision of pregnancy with history of pre-term labor, second trimester: Secondary | ICD-10-CM

## 2017-10-04 DIAGNOSIS — O09892 Supervision of other high risk pregnancies, second trimester: Secondary | ICD-10-CM

## 2017-10-04 DIAGNOSIS — Z3A25 25 weeks gestation of pregnancy: Secondary | ICD-10-CM

## 2017-10-04 LAB — POCT URINALYSIS DIPSTICK
Glucose, UA: NEGATIVE
KETONES UA: NEGATIVE
Leukocytes, UA: NEGATIVE
Nitrite, UA: NEGATIVE
Protein, UA: NEGATIVE
RBC UA: NEGATIVE

## 2017-10-04 MED ORDER — HYDROXYPROGESTERONE CAPROATE 275 MG/1.1ML ~~LOC~~ SOAJ
275.0000 mg | Freq: Once | SUBCUTANEOUS | Status: AC
  Administered 2017-10-04: 275 mg via SUBCUTANEOUS

## 2017-10-04 NOTE — Progress Notes (Signed)
Pt here for Makena 275 mg given Lt arm SQ. Tolerated well.return 1 week for next injection. PAD CMA

## 2017-10-04 NOTE — Addendum Note (Signed)
Addended by: Federico Flake A on: 10/04/2017 02:22 PM   Modules accepted: Level of Service

## 2017-10-11 ENCOUNTER — Encounter: Payer: Self-pay | Admitting: *Deleted

## 2017-10-11 ENCOUNTER — Ambulatory Visit (INDEPENDENT_AMBULATORY_CARE_PROVIDER_SITE_OTHER): Payer: Medicaid Other | Admitting: *Deleted

## 2017-10-11 VITALS — BP 110/60 | HR 99 | Ht 63.0 in | Wt 125.5 lb

## 2017-10-11 DIAGNOSIS — Z331 Pregnant state, incidental: Secondary | ICD-10-CM

## 2017-10-11 DIAGNOSIS — Z3A26 26 weeks gestation of pregnancy: Secondary | ICD-10-CM | POA: Diagnosis not present

## 2017-10-11 DIAGNOSIS — O09892 Supervision of other high risk pregnancies, second trimester: Secondary | ICD-10-CM

## 2017-10-11 DIAGNOSIS — O09212 Supervision of pregnancy with history of pre-term labor, second trimester: Secondary | ICD-10-CM

## 2017-10-11 DIAGNOSIS — Z1389 Encounter for screening for other disorder: Secondary | ICD-10-CM

## 2017-10-11 DIAGNOSIS — Z3482 Encounter for supervision of other normal pregnancy, second trimester: Secondary | ICD-10-CM

## 2017-10-11 LAB — POCT URINALYSIS DIPSTICK
Glucose, UA: NEGATIVE
KETONES UA: NEGATIVE
Leukocytes, UA: NEGATIVE
NITRITE UA: NEGATIVE
PROTEIN UA: NEGATIVE
RBC UA: NEGATIVE

## 2017-10-11 NOTE — Progress Notes (Signed)
Pt here for 17P. Pt tolerated shot well. Return in 1 week for next shot. JSY 

## 2017-10-18 ENCOUNTER — Ambulatory Visit (INDEPENDENT_AMBULATORY_CARE_PROVIDER_SITE_OTHER): Payer: Medicaid Other | Admitting: *Deleted

## 2017-10-18 VITALS — Ht 63.0 in | Wt 128.0 lb

## 2017-10-18 DIAGNOSIS — O09212 Supervision of pregnancy with history of pre-term labor, second trimester: Secondary | ICD-10-CM | POA: Diagnosis not present

## 2017-10-18 DIAGNOSIS — Z331 Pregnant state, incidental: Secondary | ICD-10-CM

## 2017-10-18 DIAGNOSIS — Z3A27 27 weeks gestation of pregnancy: Secondary | ICD-10-CM | POA: Diagnosis not present

## 2017-10-18 DIAGNOSIS — Z1389 Encounter for screening for other disorder: Secondary | ICD-10-CM

## 2017-10-18 LAB — POCT URINALYSIS DIPSTICK
Blood, UA: NEGATIVE
GLUCOSE UA: NEGATIVE
Ketones, UA: NEGATIVE
Leukocytes, UA: NEGATIVE
Nitrite, UA: NEGATIVE
Protein, UA: NEGATIVE

## 2017-10-26 ENCOUNTER — Other Ambulatory Visit: Payer: Medicaid Other

## 2017-10-26 ENCOUNTER — Ambulatory Visit (INDEPENDENT_AMBULATORY_CARE_PROVIDER_SITE_OTHER): Payer: Medicaid Other | Admitting: Women's Health

## 2017-10-26 ENCOUNTER — Encounter: Payer: Self-pay | Admitting: Women's Health

## 2017-10-26 VITALS — BP 110/60 | HR 99 | Wt 128.4 lb

## 2017-10-26 DIAGNOSIS — Z3A28 28 weeks gestation of pregnancy: Secondary | ICD-10-CM | POA: Diagnosis not present

## 2017-10-26 DIAGNOSIS — O09213 Supervision of pregnancy with history of pre-term labor, third trimester: Secondary | ICD-10-CM | POA: Diagnosis not present

## 2017-10-26 DIAGNOSIS — Z131 Encounter for screening for diabetes mellitus: Secondary | ICD-10-CM

## 2017-10-26 DIAGNOSIS — Z1389 Encounter for screening for other disorder: Secondary | ICD-10-CM

## 2017-10-26 DIAGNOSIS — O3493 Maternal care for abnormality of pelvic organ, unspecified, third trimester: Secondary | ICD-10-CM

## 2017-10-26 DIAGNOSIS — Z3483 Encounter for supervision of other normal pregnancy, third trimester: Secondary | ICD-10-CM

## 2017-10-26 DIAGNOSIS — Q5110 Doubling of uterus with doubling of cervix and vagina without obstruction: Secondary | ICD-10-CM

## 2017-10-26 DIAGNOSIS — Z331 Pregnant state, incidental: Secondary | ICD-10-CM

## 2017-10-26 DIAGNOSIS — O34219 Maternal care for unspecified type scar from previous cesarean delivery: Secondary | ICD-10-CM

## 2017-10-26 LAB — POCT URINALYSIS DIPSTICK
Blood, UA: NEGATIVE
Glucose, UA: NEGATIVE
KETONES UA: NEGATIVE
Leukocytes, UA: NEGATIVE
NITRITE UA: NEGATIVE
PROTEIN UA: NEGATIVE

## 2017-10-26 NOTE — Patient Instructions (Signed)
Sheila [name], I greatly value your feedback.  If you receive a survey following your visit with [place] today, we appreciate you taking the time to fill it out.  Thanks, Joellyn Haff, CNM, WHNP-BC   Call the office [phone]) or go to [facility] if:  You begin to have strong, frequent contractions  Your water breaks.  Sometimes it is a big gush of fluid, sometimes it is just a trickle that keeps getting your panties wet or running down your legs  You have vaginal bleeding.  It is normal to have a small amount of spotting if your cervix was checked.   You don't feel your baby moving like normal.  If you don't, get you something to eat and drink and lay down and focus on feeling your baby move.  You should feel at least 10 movements in 2 hours.  If you don't, you should call the office or go to [facility].    Tdap Vaccine  It is recommended that you get the Tdap vaccine during the third trimester of EACH pregnancy to help protect your baby from getting pertussis (whooping cough)  27-36 weeks is the BEST time to do this so that you can pass the protection on to your baby. During pregnancy is better than after pregnancy, but if you are unable to get it during pregnancy it will be offered at the hospital.   You can get this vaccine at the health department or your family doctor  Everyone who will be around your baby should also be up-to-date on their vaccines. Adults (who are not pregnant) only need 1 dose of Tdap during adulthood.   Third Trimester of Pregnancy The third trimester is from week 29 through week 42, months 7 through 9. The third trimester is a time when the fetus is growing rapidly. At the end of the ninth month, the fetus is about 20 inches in length and weighs 6-10 pounds.  BODY CHANGES Your body goes through many changes during pregnancy. The changes vary from woman to woman.   Your weight will continue to increase. You can expect to gain 25-35 pounds (11-16 kg) by  the end of the pregnancy.  You may begin to get stretch marks on your hips, abdomen, and breasts.  You may urinate more often because the fetus is moving lower into your pelvis and pressing on your bladder.  You may develop or continue to have heartburn as a result of your pregnancy.  You may develop constipation because certain hormones are causing the muscles that push waste through your intestines to slow down.  You may develop hemorrhoids or swollen, bulging veins (varicose veins).  You may have pelvic pain because of the weight gain and pregnancy hormones relaxing your joints between the bones in your pelvis. Backaches may result from overexertion of the muscles supporting your posture.  You may have changes in your hair. These can include thickening of your hair, rapid growth, and changes in texture. Some women also have hair loss during or after pregnancy, or hair that feels dry or thin. Your hair will most likely return to normal after your baby is born.  Your breasts will continue to grow and be tender. A yellow discharge may leak from your breasts called colostrum.  Your belly button may stick out.  You may feel short of breath because of your expanding uterus.  You may notice the fetus "dropping," or moving lower in your abdomen.  You may have a bloody  mucus discharge. This usually occurs a few days to a week before labor begins.  Your cervix becomes thin and soft (effaced) near your due date. WHAT TO EXPECT AT YOUR PRENATAL EXAMS  You will have prenatal exams every 2 weeks until week 36. Then, you will have weekly prenatal exams. During a routine prenatal visit:  You will be weighed to make sure you and the fetus are growing normally.  Your blood pressure is taken.  Your abdomen will be measured to track your baby's growth.  The fetal heartbeat will be listened to.  Any test results from the previous visit will be discussed.  You may have a cervical check near your  due date to see if you have effaced. At around 36 weeks, your caregiver will check your cervix. At the same time, your caregiver will also perform a test on the secretions of the vaginal tissue. This test is to determine if a type of bacteria, Group B streptococcus, is present. Your caregiver will explain this further. Your caregiver may ask you:  What your birth plan is.  How you are feeling.  If you are feeling the baby move.  If you have had any abnormal symptoms, such as leaking fluid, bleeding, severe headaches, or abdominal cramping.  If you have any questions. Other tests or screenings that may be performed during your third trimester include:  Blood tests that check for low iron levels (anemia).  Fetal testing to check the health, activity level, and growth of the fetus. Testing is done if you have certain medical conditions or if there are problems during the pregnancy. FALSE LABOR You may feel small, irregular contractions that eventually go away. These are called Braxton Hicks contractions, or false labor. Contractions may last for hours, days, or even weeks before true labor sets in. If contractions come at regular intervals, intensify, or become painful, it is best to be seen by your caregiver.  SIGNS OF LABOR   Menstrual-like cramps.  Contractions that are 5 minutes apart or less.  Contractions that start on the top of the uterus and spread down to the lower abdomen and back.  A sense of increased pelvic pressure or back pain.  A watery or bloody mucus discharge that comes from the vagina. If you have any of these signs before the 37th week of pregnancy, call your caregiver right away. You need to go to the hospital to get checked immediately. HOME CARE INSTRUCTIONS   Avoid all smoking, herbs, alcohol, and unprescribed drugs. These chemicals affect the formation and growth of the baby.  Follow your caregiver's instructions regarding medicine use. There are medicines  that are either safe or unsafe to take during pregnancy.  Exercise only as directed by your caregiver. Experiencing uterine cramps is a good sign to stop exercising.  Continue to eat regular, healthy meals.  Wear a good support bra for breast tenderness.  Do not use hot tubs, steam rooms, or saunas.  Wear your seat belt at all times when driving.  Avoid raw meat, uncooked cheese, cat litter boxes, and soil used by cats. These carry germs that can cause birth defects in the baby.  Take your prenatal vitamins.  Try taking a stool softener (if your caregiver approves) if you develop constipation. Eat more high-fiber foods, such as fresh vegetables or fruit and whole grains. Drink plenty of fluids to keep your urine clear or pale yellow.  Take warm sitz baths to soothe any pain or discomfort caused by hemorrhoids.  Use hemorrhoid cream if your caregiver approves.  If you develop varicose veins, wear support hose. Elevate your feet for 15 minutes, 3-4 times a day. Limit salt in your diet.  Avoid heavy lifting, wear low heal shoes, and practice good posture.  Rest a lot with your legs elevated if you have leg cramps or low back pain.  Visit your dentist if you have not gone during your pregnancy. Use a soft toothbrush to brush your teeth and be gentle when you floss.  A sexual relationship may be continued unless your caregiver directs you otherwise.  Do not travel far distances unless it is absolutely necessary and only with the approval of your caregiver.  Take prenatal classes to understand, practice, and ask questions about the labor and delivery.  Make a trial run to the hospital.  Pack your hospital bag.  Prepare the baby's nursery.  Continue to go to all your prenatal visits as directed by your caregiver. SEEK MEDICAL CARE IF:  You are unsure if you are in labor or if your water has broken.  You have dizziness.  You have mild pelvic cramps, pelvic pressure, or nagging  pain in your abdominal area.  You have persistent nausea, vomiting, or diarrhea.  You have a bad smelling vaginal discharge.  You have pain with urination. SEEK IMMEDIATE MEDICAL CARE IF:   You have a fever.  You are leaking fluid from your vagina.  You have spotting or bleeding from your vagina.  You have severe abdominal cramping or pain.  You have rapid weight loss or gain.  You have shortness of breath with chest pain.  You notice sudden or extreme swelling of your face, hands, ankles, feet, or legs.  You have not felt your baby move in over an hour.  You have severe headaches that do not go away with medicine.  You have vision changes. Document Released: 05/12/2001 Document Revised: 05/23/2013 Document Reviewed: 07/19/2012 [employer] Patient Information 2015 [employer]. This information is not intended to replace advice given to you by your health care provider. Make sure you discuss any questions you have with your health care provider.   

## 2017-10-26 NOTE — Progress Notes (Signed)
Pt received 17P in right arm. Pt tolerated shot well. Return in 1 week for next 17P. JSY

## 2017-10-26 NOTE — Progress Notes (Signed)
   LOW-RISK PREGNANCY VISIT Patient name: Sheila [name] MRN [id number]  Date of birth: 1990/08/08 Chief Complaint:   Routine Prenatal Visit (PN2, 17P today)  History of Present Illness:   Sheila [name] is a 27 y.o. [phone] female at [redacted]w[redacted]d with an Estimated Date of Delivery: 01/15/18 being seen today for ongoing management of a low-risk pregnancy.  Today she reports no complaints. Contractions: Not present. Vag. Bleeding: None.  Movement: Present. denies leaking of fluid. Review of Systems:   Pertinent items are noted in HPI Denies abnormal vaginal discharge w/ itching/odor/irritation, headaches, visual changes, shortness of breath, chest pain, abdominal pain, severe nausea/vomiting, or problems with urination or bowel movements unless otherwise stated above. Pertinent History Reviewed:  Reviewed past medical,surgical, social, obstetrical and family history.  Reviewed problem list, medications and allergies. Physical Assessment:   Vitals:   10/26/17 0945  BP: 110/60  Pulse: 99  Weight: 128 lb 6.4 oz (58.2 kg)  Body mass index is 22.75 kg/m.        Physical Examination:   General appearance: Well appearing, and in no distress  Mental status: Alert, oriented to person, place, and time  Skin: Warm & dry  Cardiovascular: Normal heart rate noted  Respiratory: Normal respiratory effort, no distress  Abdomen: Soft, gravid, nontender  Pelvic: Cervical exam deferred         Extremities: Edema: None  Fetal Status: Fetal Heart Rate (bpm): 135 Fundal Height: 27 cm Movement: Present    Results for orders placed or performed in visit on 10/26/17 (from the past 24 hour(s))  POCT urinalysis dipstick   Collection Time: 10/26/17  9:46 AM  Result Value Ref Range   Color, UA     Clarity, UA     Glucose, UA Negative Negative   Bilirubin, UA     Ketones, UA neg    Spec Grav, UA  1.010 - 1.025   Blood, UA neg    pH, UA  5.0 - 8.0   Protein, UA Negative Negative   Urobilinogen, UA  0.2 or  1.0 E.U./dL   Nitrite, UA neg    Leukocytes, UA Negative Negative   Appearance     Odor      Assessment & Plan:  1) Low-risk pregnancy [id number] at [redacted]w[redacted]d with an Estimated Date of Delivery: 01/15/18   2) Uterine didelphys w/ vag septum, preg Rt horn  3) Prev c/s for ?mild AFE/FTP> for RCS  4) H/O 36wk PTB d/t PPROM> weekly Makena   Meds: No orders of the defined types were placed in this encounter.  Labs/procedures today: pn2, Makena, decined tdap today- wants next week w/ Makena  Plan:  Continue routine obstetrical care   Reviewed: Preterm labor symptoms and general obstetric precautions including but not limited to vaginal bleeding, contractions, leaking of fluid and fetal movement were reviewed in detail with the patient. Recommended Tdap at HD/PCP per CDC guidelines.  All questions were answered  Follow-up: Return in about 1 month (around 11/23/2017) for 17P, LROB, Weekly 17P (wants tdap next Mon).  Orders Placed This Encounter  Procedures  . POCT urinalysis dipstick   Cheral Marker CNM, [facility] 10/26/2017 10:09 AM

## 2017-10-27 LAB — HIV ANTIBODY (ROUTINE TESTING W REFLEX): HIV Screen 4th Generation wRfx: NONREACTIVE

## 2017-10-27 LAB — GLUCOSE TOLERANCE, 2 HOURS W/ 1HR
GLUCOSE, FASTING: 71 mg/dL (ref 65–91)
Glucose, 1 hour: 144 mg/dL (ref 65–179)
Glucose, 2 hour: 94 mg/dL (ref 65–152)

## 2017-10-27 LAB — CBC
Hematocrit: 35.8 % (ref 34.0–46.6)
Hemoglobin: 11.8 g/dL (ref 11.1–15.9)
MCH: 27.4 pg (ref 26.6–33.0)
MCHC: 33 g/dL (ref 31.5–35.7)
MCV: 83 fL (ref 79–97)
Platelets: 237 10*3/uL (ref 150–450)
RBC: 4.3 x10E6/uL (ref 3.77–5.28)
RDW: 12.8 % (ref 12.3–15.4)
WBC: 13.6 10*3/uL — ABNORMAL HIGH (ref 3.4–10.8)

## 2017-10-27 LAB — ANTIBODY SCREEN: Antibody Screen: NEGATIVE

## 2017-10-27 LAB — RPR: RPR Ser Ql: NONREACTIVE

## 2017-11-01 ENCOUNTER — Ambulatory Visit (INDEPENDENT_AMBULATORY_CARE_PROVIDER_SITE_OTHER): Payer: Medicaid Other | Admitting: *Deleted

## 2017-11-01 ENCOUNTER — Encounter: Payer: Self-pay | Admitting: *Deleted

## 2017-11-01 VITALS — BP 110/72 | HR 115 | Ht 62.0 in | Wt 127.0 lb

## 2017-11-01 DIAGNOSIS — O09213 Supervision of pregnancy with history of pre-term labor, third trimester: Secondary | ICD-10-CM | POA: Diagnosis not present

## 2017-11-01 DIAGNOSIS — Z1389 Encounter for screening for other disorder: Secondary | ICD-10-CM

## 2017-11-01 DIAGNOSIS — Z23 Encounter for immunization: Secondary | ICD-10-CM | POA: Diagnosis not present

## 2017-11-01 DIAGNOSIS — Z3A29 29 weeks gestation of pregnancy: Secondary | ICD-10-CM

## 2017-11-01 DIAGNOSIS — Z331 Pregnant state, incidental: Secondary | ICD-10-CM

## 2017-11-01 DIAGNOSIS — Z3483 Encounter for supervision of other normal pregnancy, third trimester: Secondary | ICD-10-CM

## 2017-11-01 DIAGNOSIS — O09893 Supervision of other high risk pregnancies, third trimester: Secondary | ICD-10-CM

## 2017-11-01 LAB — POCT URINALYSIS DIPSTICK
Glucose, UA: NEGATIVE
KETONES UA: NEGATIVE
Leukocytes, UA: NEGATIVE
Nitrite, UA: NEGATIVE
Protein, UA: POSITIVE — AB
RBC UA: NEGATIVE

## 2017-11-01 MED ORDER — HYDROXYPROGESTERONE CAPROATE 250 MG/ML IM OIL: 250.0000 mg | TOPICAL_OIL | Freq: Once | INTRAMUSCULAR | Status: DC

## 2017-11-01 NOTE — Progress Notes (Signed)
Pt here for 17P and Tdap injection. 17P given in left arm. Tdap given in right arm.  Pt tolerated shots well. Return in 1 week for 17P. JSY

## 2017-11-08 ENCOUNTER — Ambulatory Visit (INDEPENDENT_AMBULATORY_CARE_PROVIDER_SITE_OTHER): Payer: Medicaid Other | Admitting: *Deleted

## 2017-11-08 ENCOUNTER — Encounter: Payer: Self-pay | Admitting: *Deleted

## 2017-11-08 VITALS — BP 102/67 | HR 85 | Wt 130.0 lb

## 2017-11-08 DIAGNOSIS — Z1389 Encounter for screening for other disorder: Secondary | ICD-10-CM

## 2017-11-08 DIAGNOSIS — O09213 Supervision of pregnancy with history of pre-term labor, third trimester: Secondary | ICD-10-CM

## 2017-11-08 DIAGNOSIS — O09893 Supervision of other high risk pregnancies, third trimester: Secondary | ICD-10-CM

## 2017-11-08 DIAGNOSIS — Z3A30 30 weeks gestation of pregnancy: Secondary | ICD-10-CM | POA: Diagnosis not present

## 2017-11-08 DIAGNOSIS — Z331 Pregnant state, incidental: Secondary | ICD-10-CM

## 2017-11-08 LAB — POCT URINALYSIS DIPSTICK
Blood, UA: NEGATIVE
Glucose, UA: NEGATIVE
Ketones, UA: NEGATIVE
Leukocytes, UA: NEGATIVE
NITRITE UA: NEGATIVE
Protein, UA: NEGATIVE

## 2017-11-08 NOTE — Progress Notes (Signed)
Pt in for Makena injection. She is feeling well. Baby is moving.   Makena given in right arm subcutaneously.

## 2017-11-15 ENCOUNTER — Ambulatory Visit (INDEPENDENT_AMBULATORY_CARE_PROVIDER_SITE_OTHER): Payer: Medicaid Other | Admitting: *Deleted

## 2017-11-15 DIAGNOSIS — O09213 Supervision of pregnancy with history of pre-term labor, third trimester: Secondary | ICD-10-CM

## 2017-11-15 DIAGNOSIS — Z3A30 30 weeks gestation of pregnancy: Secondary | ICD-10-CM | POA: Diagnosis not present

## 2017-11-15 DIAGNOSIS — O09219 Supervision of pregnancy with history of pre-term labor, unspecified trimester: Principal | ICD-10-CM

## 2017-11-15 DIAGNOSIS — O09899 Supervision of other high risk pregnancies, unspecified trimester: Secondary | ICD-10-CM

## 2017-11-15 NOTE — Progress Notes (Signed)
Pt given makena sq in left arm. Tolerated well. Next dose in 1 week.

## 2017-11-23 ENCOUNTER — Encounter: Payer: Self-pay | Admitting: Obstetrics & Gynecology

## 2017-11-23 ENCOUNTER — Ambulatory Visit (INDEPENDENT_AMBULATORY_CARE_PROVIDER_SITE_OTHER): Payer: Medicaid Other | Admitting: Obstetrics & Gynecology

## 2017-11-23 VITALS — BP 120/72 | HR 108 | Wt 136.0 lb

## 2017-11-23 DIAGNOSIS — Z3483 Encounter for supervision of other normal pregnancy, third trimester: Secondary | ICD-10-CM

## 2017-11-23 DIAGNOSIS — O34593 Maternal care for other abnormalities of gravid uterus, third trimester: Secondary | ICD-10-CM

## 2017-11-23 DIAGNOSIS — O09893 Supervision of other high risk pregnancies, third trimester: Secondary | ICD-10-CM

## 2017-11-23 DIAGNOSIS — O09213 Supervision of pregnancy with history of pre-term labor, third trimester: Secondary | ICD-10-CM

## 2017-11-23 DIAGNOSIS — Z1389 Encounter for screening for other disorder: Secondary | ICD-10-CM

## 2017-11-23 DIAGNOSIS — Q512 Other doubling of uterus, unspecified: Secondary | ICD-10-CM

## 2017-11-23 DIAGNOSIS — Z3A32 32 weeks gestation of pregnancy: Secondary | ICD-10-CM

## 2017-11-23 DIAGNOSIS — Q5128 Other doubling of uterus, other specified: Secondary | ICD-10-CM

## 2017-11-23 DIAGNOSIS — Z331 Pregnant state, incidental: Secondary | ICD-10-CM

## 2017-11-23 DIAGNOSIS — O3403 Maternal care for unspecified congenital malformation of uterus, third trimester: Secondary | ICD-10-CM

## 2017-11-23 LAB — POCT URINALYSIS DIPSTICK
Blood, UA: NEGATIVE
GLUCOSE UA: NEGATIVE
KETONES UA: NEGATIVE
Leukocytes, UA: NEGATIVE
Nitrite, UA: NEGATIVE
Protein, UA: NEGATIVE

## 2017-11-23 MED ORDER — HYDROXYPROGESTERONE CAPROATE 275 MG/1.1ML ~~LOC~~ SOAJ
275.0000 mg | Freq: Once | SUBCUTANEOUS | Status: AC
  Administered 2017-11-23: 275 mg via SUBCUTANEOUS

## 2017-11-23 NOTE — Progress Notes (Signed)
.   [id number] Estimated Date of Delivery: 01/15/18  Blood pressure 120/72, pulse (!) 108, weight 136 lb (61.7 kg), last menstrual period 03/30/2017.   BP weight and urine results all reviewed and noted.  Please refer to the obstetrical flow sheet for the fundal height and fetal heart rate documentation:  Patient reports good fetal movement, denies any bleeding and no rupture of membranes symptoms or regular contractions. Patient is without complaints. All questions were answered.  Orders Placed This Encounter  Procedures  . US OB Follow Up  . POCT urinalysis dipstick    Plan:  Continued routine obstetrical care, check EFW and fluid volume due to uterine didelphys  Return in about 1 week (around 11/30/2017) for sonogram for EFW, , LROB.

## 2017-11-30 ENCOUNTER — Ambulatory Visit (INDEPENDENT_AMBULATORY_CARE_PROVIDER_SITE_OTHER): Payer: Medicaid Other

## 2017-11-30 VITALS — BP 112/73 | HR 103 | Ht 63.0 in | Wt 136.4 lb

## 2017-11-30 DIAGNOSIS — Z3483 Encounter for supervision of other normal pregnancy, third trimester: Secondary | ICD-10-CM

## 2017-11-30 DIAGNOSIS — Z331 Pregnant state, incidental: Secondary | ICD-10-CM

## 2017-11-30 DIAGNOSIS — O09219 Supervision of pregnancy with history of pre-term labor, unspecified trimester: Secondary | ICD-10-CM | POA: Diagnosis not present

## 2017-11-30 DIAGNOSIS — O09899 Supervision of other high risk pregnancies, unspecified trimester: Secondary | ICD-10-CM

## 2017-11-30 DIAGNOSIS — Z1389 Encounter for screening for other disorder: Secondary | ICD-10-CM

## 2017-11-30 LAB — POCT URINALYSIS DIPSTICK
Glucose, UA: NEGATIVE
Ketones, UA: NEGATIVE
LEUKOCYTES UA: NEGATIVE
Nitrite, UA: NEGATIVE
Protein, UA: POSITIVE — AB
RBC UA: NEGATIVE

## 2017-11-30 MED ORDER — HYDROXYPROGESTERONE CAPROATE 275 MG/1.1ML ~~LOC~~ SOAJ
275.0000 mg | Freq: Once | SUBCUTANEOUS | Status: AC
  Administered 2017-11-30: 275 mg via SUBCUTANEOUS

## 2017-11-30 NOTE — Progress Notes (Signed)
Pt here 17p injection,given SQ lt arm. Tolerated well. Return 1 week for next injection. Pad CMA

## 2017-12-07 ENCOUNTER — Ambulatory Visit (INDEPENDENT_AMBULATORY_CARE_PROVIDER_SITE_OTHER): Payer: Medicaid Other

## 2017-12-07 ENCOUNTER — Encounter (HOSPITAL_COMMUNITY): Payer: Self-pay | Admitting: *Deleted

## 2017-12-07 ENCOUNTER — Emergency Department (HOSPITAL_COMMUNITY)
Admission: EM | Admit: 2017-12-07 | Discharge: 2017-12-07 | Discharge status: Against Medical Advice | Payer: Medicaid Other | Attending: Emergency Medicine | Admitting: Emergency Medicine

## 2017-12-07 ENCOUNTER — Encounter: Payer: Self-pay | Admitting: Obstetrics & Gynecology

## 2017-12-07 ENCOUNTER — Ambulatory Visit (INDEPENDENT_AMBULATORY_CARE_PROVIDER_SITE_OTHER): Payer: Medicaid Other | Admitting: Obstetrics & Gynecology

## 2017-12-07 VITALS — BP 117/81 | HR 102 | Wt 137.0 lb

## 2017-12-07 DIAGNOSIS — Z331 Pregnant state, incidental: Secondary | ICD-10-CM

## 2017-12-07 DIAGNOSIS — O09213 Supervision of pregnancy with history of pre-term labor, third trimester: Secondary | ICD-10-CM | POA: Diagnosis not present

## 2017-12-07 DIAGNOSIS — N898 Other specified noninflammatory disorders of vagina: Secondary | ICD-10-CM | POA: Diagnosis not present

## 2017-12-07 DIAGNOSIS — Z87891 Personal history of nicotine dependence: Secondary | ICD-10-CM | POA: Insufficient documentation

## 2017-12-07 DIAGNOSIS — Z3A34 34 weeks gestation of pregnancy: Secondary | ICD-10-CM

## 2017-12-07 DIAGNOSIS — Z3490 Encounter for supervision of normal pregnancy, unspecified, unspecified trimester: Secondary | ICD-10-CM | POA: Diagnosis not present

## 2017-12-07 DIAGNOSIS — O09893 Supervision of other high risk pregnancies, third trimester: Secondary | ICD-10-CM

## 2017-12-07 DIAGNOSIS — O26893 Other specified pregnancy related conditions, third trimester: Secondary | ICD-10-CM | POA: Insufficient documentation

## 2017-12-07 DIAGNOSIS — Z1389 Encounter for screening for other disorder: Secondary | ICD-10-CM

## 2017-12-07 DIAGNOSIS — Z3403 Encounter for supervision of normal first pregnancy, third trimester: Secondary | ICD-10-CM

## 2017-12-07 LAB — POCT URINALYSIS DIPSTICK
Glucose, UA: NEGATIVE
Ketones, UA: NEGATIVE
LEUKOCYTES UA: NEGATIVE
NITRITE UA: NEGATIVE
PROTEIN UA: POSITIVE — AB
RBC UA: NEGATIVE

## 2017-12-07 MED ORDER — HYDROXYPROGESTERONE CAPROATE 275 MG/1.1ML ~~LOC~~ SOAJ
275.0000 mg | Freq: Once | SUBCUTANEOUS | Status: AC
  Administered 2017-12-07: 275 mg via SUBCUTANEOUS

## 2017-12-07 NOTE — Progress Notes (Signed)
US 34+3 wks,cephalic,afi 14 cm,posterior pl gr 1,normal ovaries bilat,fhr 144 bpm,efw 2130 g 15%,limited head measurement because of fetal position

## 2017-12-07 NOTE — Progress Notes (Signed)
[id number] 6065w3d Estimated Date of Delivery: 01/15/18  Blood pressure 117/81, pulse (!) 102, weight 137 lb (62.1 kg), last menstrual period 03/30/2017.   BP weight and urine results all reviewed and noted.  Please refer to the obstetrical flow sheet for the fundal height and fetal heart rate documentation:  Patient reports good fetal movement, denies any bleeding and no rupture of membranes symptoms or regular contractions. Patient is without complaints. All questions were answered.  Orders Placed This Encounter  Procedures  . POCT urinalysis dipstick    Plan:  Continued routine obstetrical care, received her 17P today  Return in about 2 weeks (around 12/21/2017) for LROB, 1 week 17P.

## 2017-12-07 NOTE — ED Notes (Signed)
Pt left AMA °

## 2017-12-07 NOTE — ED Triage Notes (Signed)
Pt believes she lost her mucus plug today, seen Dr. Despina HiddenEure today and everything appears normal per pt

## 2017-12-07 NOTE — ED Notes (Signed)
Pt placed on OB monitor and [facility] Rapid Response [facility]

## 2017-12-08 ENCOUNTER — Telehealth: Payer: Self-pay | Admitting: Women's Health

## 2017-12-08 ENCOUNTER — Encounter (HOSPITAL_COMMUNITY): Payer: Self-pay

## 2017-12-08 ENCOUNTER — Other Ambulatory Visit: Payer: Self-pay

## 2017-12-08 ENCOUNTER — Inpatient Hospital Stay (HOSPITAL_COMMUNITY)
Admission: AD | Admit: 2017-12-08 | Discharge: 2017-12-08 | Disposition: A | Discharge status: Home / Self Care | Payer: Medicaid Other | Source: Ambulatory Visit | Attending: Family Medicine | Admitting: Family Medicine

## 2017-12-08 DIAGNOSIS — Z3A34 34 weeks gestation of pregnancy: Secondary | ICD-10-CM | POA: Diagnosis not present

## 2017-12-08 DIAGNOSIS — Z87891 Personal history of nicotine dependence: Secondary | ICD-10-CM | POA: Diagnosis not present

## 2017-12-08 DIAGNOSIS — O163 Unspecified maternal hypertension, third trimester: Secondary | ICD-10-CM

## 2017-12-08 DIAGNOSIS — O4703 False labor before 37 completed weeks of gestation, third trimester: Secondary | ICD-10-CM | POA: Insufficient documentation

## 2017-12-08 DIAGNOSIS — O479 False labor, unspecified: Secondary | ICD-10-CM

## 2017-12-08 LAB — URINALYSIS, ROUTINE W REFLEX MICROSCOPIC
Bilirubin Urine: NEGATIVE
Glucose, UA: NEGATIVE mg/dL
Hgb urine dipstick: NEGATIVE
Ketones, ur: NEGATIVE mg/dL
Nitrite: NEGATIVE
Protein, ur: 100 mg/dL — AB
Specific Gravity, Urine: 1.009 (ref 1.005–1.030)
Squamous Epithelial / LPF: >50 — ABNORMAL HIGH (ref 0–5)
pH: 6 (ref 5.0–8.0)

## 2017-12-08 LAB — CBC
HCT: 35.1 % — ABNORMAL LOW (ref 36.0–46.0)
Hemoglobin: 11.8 g/dL — ABNORMAL LOW (ref 12.0–15.0)
MCH: 26.7 pg (ref 26.0–34.0)
MCHC: 33.6 g/dL (ref 30.0–36.0)
MCV: 79.4 fL (ref 78.0–100.0)
Platelets: 230 10*3/uL (ref 150–400)
RBC: 4.42 MIL/uL (ref 3.87–5.11)
RDW: 12.1 % (ref 11.5–15.5)
WBC: 16 10*3/uL — ABNORMAL HIGH (ref 4.0–10.5)

## 2017-12-08 LAB — COMPREHENSIVE METABOLIC PANEL
ALT: 13 U/L (ref 0–44)
AST: 22 U/L (ref 15–41)
Albumin: 2.7 g/dL — ABNORMAL LOW (ref 3.5–5.0)
Alkaline Phosphatase: 159 U/L — ABNORMAL HIGH (ref 38–126)
Anion gap: 12 (ref 5–15)
BUN: 9 mg/dL (ref 6–20)
CO2: 18 mmol/L — ABNORMAL LOW (ref 22–32)
Calcium: 8.3 mg/dL — ABNORMAL LOW (ref 8.9–10.3)
Chloride: 104 mmol/L (ref 98–111)
Creatinine, Ser: 0.76 mg/dL (ref 0.44–1.00)
GFR calc Af Amer: >60 mL/min (ref >60)
GFR calc non Af Amer: >60 mL/min (ref >60)
Glucose, Bld: 94 mg/dL (ref 70–99)
Potassium: 3.5 mmol/L (ref 3.5–5.1)
Sodium: 134 mmol/L — ABNORMAL LOW (ref 135–145)
Total Bilirubin: 0.7 mg/dL (ref 0.3–1.2)
Total Protein: 6.9 g/dL (ref 6.5–8.1)

## 2017-12-08 LAB — PROTEIN / CREATININE RATIO, URINE
Creatinine, Urine: 82 mg/dL
Protein Creatinine Ratio: 1.09 mg/mg{Cre} — ABNORMAL HIGH (ref 0.00–0.15)
Total Protein, Urine: 89 mg/dL

## 2017-12-08 NOTE — MAU Provider Note (Signed)
Chief Complaint:  Vaginal Discharge (lost mucus plug)   First Provider Initiated Contact with Patient 12/08/17 0155      HPI: Sheila [name] is a 27 y.o. [id number] at 4434w4dwho presents to maternity admissions reporting losing mucous plug. She reports losing her mucous at 2130 last night. She denies abdominal pain or contractions after losing mucous plug. She has a hx of PTB @36  weeks and wants to make sure baby is okay. She is getting Makena injections weekly. She reports good fetal movement, denies LOF, vaginal bleeding, vaginal itching/burning, or urinary symptoms. She has uterine didelphys with vaginal septum- pregnancy located on the right. She denies hx of hypertension during pregnancy.   Past Medical History: Past Medical History:  Diagnosis Date  . Anxiety   . Hematuria 11/07/2014  . 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 Nosebleed 09/06/2013  . Pain with urination 11/07/2014  . Round ligament pain 09/06/2013    Past obstetric history: OB History  Gravida Para Term Preterm AB Living  3 1   1 1 1   SAB TAB Ectopic Multiple Live Births  1       1    # Outcome Date GA Lbr Len/2nd Weight Sex Delivery Anes PTL Lv  3 Current           2 SAB 03/2016          1 Preterm 12/21/13 507w2d  6 lb 8.1 oz (2.951 kg) M CS-LTranv EPI N LIV     Complications: Failure to Progress in First Stage, Uterus didelphys    Past Surgical History: Past Surgical History:  Procedure Laterality Date  . Bilateral breast implants  12/2011    . CESAREAN SECTION N/A 12/21/2013   Procedure: CESAREAN SECTION;  Surgeon: Lazaro ArmsLuther H Eure, MD;  Location: [facility];  Service: Obstetrics;  Laterality: N/A;  . TONSILLECTOMY      Family History: Family History  Problem Relation Age of Onset  . Cancer Maternal Grandmother   . Cancer Maternal Grandfather     Social History: Social History   Tobacco Use  . Smoking status: Former Smoker    Types: Cigarettes  . Smokeless tobacco: Never Used  Substance Use Topics   . Alcohol use: No    Alcohol/week: 1.2 oz    Types: 2 Shots of liquor per week  . Drug use: No    Allergies: No Known Allergies  Meds:  Facility-Administered Medications Prior to Admission  Medication Dose Route Frequency Provider Last Rate Last Dose  . hydroxyprogesterone caproate (MAKENA) 250 mg/mL injection 250 mg  250 mg Intramuscular Once Lazaro ArmsEure, Luther H, MD       Medications Prior to Admission  Medication Sig Dispense Refill Last Dose  . Prenatal Vit-Fe Fumarate-FA (PRENATAL MULTIVITAMIN) TABS tablet Take 1 tablet by mouth daily at 12 noon.   12/07/2017 at Unknown time  . TRAZODONE HCL PO Take by mouth at bedtime as needed.    More than a month at Unknown time    ROS:  Review of Systems  Respiratory: Negative.   Cardiovascular: Negative.   Gastrointestinal: Negative.   Genitourinary: Positive for vaginal discharge. Negative for difficulty urinating, dysuria, flank pain, frequency, pelvic pain and vaginal bleeding.   I have reviewed patient's Past Medical Hx, Surgical Hx, Family Hx, Social Hx, medications and allergies.   Physical Exam   Patient Vitals for the past 24 hrs:  BP Temp Temp src Pulse Resp SpO2 Weight  12/08/17 0256 126/72 - - 84 - - -  12/08/17 0242 126/72 - - 84 - - -  12/08/17 0158 115/80 - - 89 - - -  12/08/17 0107 135/90 - - 92 - - -  12/08/17 0106 135/90 98.5 F (36.9 C) Oral 94 16 98 % -  12/08/17 0056 - - - - - - 137 lb (62.1 kg)   Constitutional: Well-developed, well-nourished female in no acute distress.  Cardiovascular: normal rate Respiratory: normal effort GI: Abd soft, non-tender, gravid appropriate for gestational age.  MS: Extremities nontender, no edema, normal ROM Neurologic: Alert and oriented x 4.  GU: Neg CVAT.  CERVICAL EXAM: Dilation: Closed Effacement (%): Thick Cervical Position: Posterior Presentation: Vertex Exam by:: Steward Drone, CNM   FHT:  Baseline 145 , moderate variability, accelerations present, no  decelerations Contractions: irregular mild contractions with UI    Labs: Results for orders placed or performed during the hospital encounter of 12/08/17 (from the past 24 hour(s))  Urinalysis, Routine w reflex microscopic     Status: Abnormal   Collection Time: 12/08/17  1:00 AM  Result Value Ref Range   Color, Urine YELLOW YELLOW   APPearance CLOUDY (A) CLEAR   Specific Gravity, Urine 1.009 1.005 - 1.030   pH 6.0 5.0 - 8.0   Glucose, UA NEGATIVE NEGATIVE mg/dL   Hgb urine dipstick NEGATIVE NEGATIVE   Bilirubin Urine NEGATIVE NEGATIVE   Ketones, ur NEGATIVE NEGATIVE mg/dL   Protein, ur 161 (A) NEGATIVE mg/dL   Nitrite NEGATIVE NEGATIVE   Leukocytes, UA SMALL (A) NEGATIVE   RBC / HPF 11-20 0 - 5 RBC/hpf   WBC, UA 21-50 0 - 5 WBC/hpf   Bacteria, UA FEW (A) NONE SEEN   Squamous Epithelial / LPF >50 (H) 0 - 5   Mucus PRESENT    Non Squamous Epithelial 0-5 (A) NONE SEEN  Protein / creatinine ratio, urine     Status: Abnormal   Collection Time: 12/08/17  1:00 AM  Result Value Ref Range   Creatinine, Urine 82.00 mg/dL   Total Protein, Urine 89 mg/dL   Protein Creatinine Ratio 1.09 (H) 0.00 - 0.15 mg/mg[Cre]  CBC     Status: Abnormal   Collection Time: 12/08/17  2:17 AM  Result Value Ref Range   WBC 16.0 (H) 4.0 - 10.5 K/uL   RBC 4.42 3.87 - 5.11 MIL/uL   Hemoglobin 11.8 (L) 12.0 - 15.0 g/dL   HCT 09.6 (L) 04.5 - 40.9 %   MCV 79.4 78.0 - 100.0 fL   MCH 26.7 26.0 - 34.0 pg   MCHC 33.6 30.0 - 36.0 g/dL   RDW 81.1 91.4 - 78.2 %   Platelets 230 150 - 400 K/uL  Comprehensive metabolic panel     Status: Abnormal   Collection Time: 12/08/17  2:17 AM  Result Value Ref Range   Sodium 134 (L) 135 - 145 mmol/L   Potassium 3.5 3.5 - 5.1 mmol/L   Chloride 104 98 - 111 mmol/L   CO2 18 (L) 22 - 32 mmol/L   Glucose, Bld 94 70 - 99 mg/dL   BUN 9 6 - 20 mg/dL   Creatinine, Ser 9.56 0.44 - 1.00 mg/dL   Calcium 8.3 (L) 8.9 - 10.3 mg/dL   Total Protein 6.9 6.5 - 8.1 g/dL   Albumin 2.7 (L)  3.5 - 5.0 g/dL   AST 22 15 - 41 U/L   ALT 13 0 - 44 U/L   Alkaline Phosphatase 159 (H) 38 - 126 U/L   Total Bilirubin 0.7 0.3 -  1.2 mg/dL   GFR calc non Af Amer >60 >60 mL/min   GFR calc Af Amer >60 >60 mL/min   Anion gap 12 5 - 15   A/Positive/-- (01/28 1508)  MAU Course/MDM: Orders Placed This Encounter  Procedures  . Urinalysis, Routine w reflex microscopic  . Protein / creatinine ratio, urine  . CBC  . Comprehensive metabolic panel  . Discharge patient Discharge disposition: 01-Home or Self Care; Discharge patient date: 12/08/2017   NST reviewed- reactive  UA- positive protein in urine, otherwise negative  PEC labs obtained due to new onset hypertension- PCR 1.09, CMP and CBC- WNL   Consult Dr Adrian Blackwater with presentation, exam findings and test results with elevated PCR. Okay with discharge home with strict PEC precautions and reasons to return to MAU. Follow up in the office with BP check.   Pt discharge with strict PEC precautions.  Today's evaluation included a work-up for preterm labor which can be life-threatening for both mom and baby.  Assessment: 1. Hypertension during pregnancy in third trimester, unspecified hypertension in pregnancy type   2. [redacted] weeks gestation of pregnancy   3. Braxton Hicks contractions     Plan: Discharge home Preterm Labor precautions and fetal kick counts Follow up in the office for reassessment of BP  Return to MAU as needed for s/s of PEC   Follow-up Information    [facility] Follow up.   Specialty:  Obstetrics and Gynecology Why:  Follow up as scheduled for prenatal appointments  Contact information: [address] [place] [address] [phone]          Allergies as of 12/08/2017   No Known Allergies     Medication List    TAKE these medications   prenatal multivitamin Tabs tablet Take 1 tablet by mouth daily at 12 noon.   TRAZODONE HCL PO Take by mouth at bedtime as needed.        Steward Drone Certified Nurse-Midwife 12/09/2017 1:01 AM

## 2017-12-08 NOTE — MAU Note (Addendum)
Pt states that she lost her mucous plug at 2130 12/07/2017  Denies vaginal bleeding, denies LOF.   Denies pain.

## 2017-12-08 NOTE — Telephone Encounter (Signed)
Patient called stating that she would like to leave a message for [facility] stating that she has lost her Mucus Plug. Pt is not having any contraction or discomforted at the moment. Please contact pt

## 2017-12-08 NOTE — ED Provider Notes (Signed)
[facility] EMERGENCY DEPARTMENT Provider Note   CSN: [id number] Arrival date & time: 12/07/17  2253  Time seen 23:10 PM   History   Chief Complaint Chief Complaint  Patient presents with  . pregnancy    HPI Sheila [name] is a 27 y.o. female.  HPI patient is G3 P1 Ab1, approximately [redacted] weeks pregnant [facility].  She was last seen on July 9 for her routine prenatal care.  She states she has been getting progesterone shots weekly since she was [redacted] weeks pregnant.  She also states she needs to have a scheduled C-section because she has uterus didelphys.  She states tonight about 9 PM she went to the bathroom and she passed a mucous plug.  She indicates it was about 2 cm in size and about 8 cm long.  She states it was yellow in color.  She denies any abdominal pain, any abdominal bleeding, or any symptoms.  She states she called Dr. Despina Hidden and was told to go to [facility] hospital, she came to our facility instead.  PCP Jonita Albee, [facility]  Past Medical History:  Diagnosis Date  . Anxiety   . Hematuria 11/07/2014  . 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 Nosebleed 09/06/2013  . Pain with urination 11/07/2014  . Round ligament pain 09/06/2013    Patient Active Problem List   Diagnosis Date Noted  . Marijuana use 06/30/2017  . Supervision of normal pregnancy 06/28/2017  . History of preterm delivery, currently pregnant 06/28/2017  . History of cervical incompetence in pregnancy, currently pregnant 06/28/2017  . Right ovarian cyst 06/10/2017  . Stress and adjustment reaction 01/22/2014  . History of cesarean section 12/21/2013  . 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 Bilateral breast implants  12/2011    . CESAREAN SECTION N/A 12/21/2013   Procedure: CESAREAN SECTION;  Surgeon: Lazaro Arms, MD;  Location: [facility];  Service: Obstetrics;  Laterality: N/A;    . TONSILLECTOMY       OB History    Gravida  3   Para  1   Term      Preterm  1   AB  1   Living  1     SAB  1   TAB      Ectopic      Multiple      Live Births  1            Home Medications    Prior to Admission medications   Medication Sig Start Date End Date Taking? Authorizing Provider  Prenatal Vit-Fe Fumarate-FA (PRENATAL MULTIVITAMIN) TABS tablet Take 1 tablet by mouth daily at 12 noon.    [provider]  TRAZODONE HCL PO Take by mouth at bedtime as needed.     [provider]    Family History Family History  Problem Relation Age of Onset  . Cancer Maternal Grandmother   . Cancer Maternal Grandfather     Social History Social History   Tobacco Use  . Smoking status: Former Smoker    Types: Cigarettes  . Smokeless tobacco: Never Used  Substance Use Topics  . Alcohol use: No    Alcohol/week: 1.2 oz    Types: 2 Shots of liquor per week  . Drug use: No     Allergies   Patient has no known allergies.  Review of Systems Review of Systems  All other systems reviewed and are negative.    Physical Exam Updated Vital Signs BP (!) 139/99   Pulse 99   Temp 97.7 F (36.5 C) (Oral)   Resp 16   Ht 5\' 2"  (1.575 m)   Wt 62.1 kg (137 lb)   LMP 03/30/2017   SpO2 97%   BMI 25.06 kg/m   Vital signs normal except for borderline tachycardia and hypertension   Physical Exam  Constitutional: She is oriented to person, place, and time. She appears well-developed and well-nourished.  Non-toxic appearance. She does not appear ill. No distress.  HENT:  Head: Normocephalic and atraumatic.  Right Ear: External ear normal.  Left Ear: External ear normal.  Nose: Nose normal. No mucosal edema or rhinorrhea.  Mouth/Throat: Oropharynx is clear and moist and mucous membranes are normal. No dental abscesses or uvula swelling.  Eyes: Pupils are equal, round, and reactive to light. Conjunctivae and EOM are normal.  Neck: Normal  range of motion and full passive range of motion without pain. Neck supple.  Cardiovascular: Normal rate, regular rhythm and normal heart sounds. Exam reveals no gallop and no friction rub.  No murmur heard. Pulmonary/Chest: Effort normal and breath sounds normal. No respiratory distress. She has no wheezes. She has no rhonchi. She has no rales. She exhibits no tenderness and no crepitus.  Abdominal: Soft. Normal appearance and bowel sounds are normal. She exhibits no distension. There is no tenderness. There is no rebound and no guarding.  Abdomen consistent with dates, on the fetal monitor her fetal heart rate was 140-153  Musculoskeletal: Normal range of motion. She exhibits no edema or tenderness.  Moves all extremities well.   Neurological: She is alert and oriented to person, place, and time. She has normal strength. No cranial nerve deficit.  Skin: Skin is warm, dry and intact. No rash noted. No erythema. No pallor.  Psychiatric: She has a normal mood and affect. Her speech is normal and behavior is normal. Her mood appears not anxious.  Nursing note and vitals reviewed.    ED Treatments / Results  Labs (all labs ordered are listed, but only abnormal results are displayed) Labs Reviewed - No data to display  EKG None  Radiology No results found.  Procedures Procedures (including critical care time)  Medications Ordered in ED Medications - No data to display   Initial Impression / Assessment and Plan / ED Course  I have reviewed the triage vital signs and the nursing notes.  Pertinent labs & imaging results that were available during my care of the patient were reviewed by me and considered in my medical decision making (see chart for details).   Patient was placed on fetal monitoring.  I spoken to Dr. Adrian BlackwaterStinson at [phone].  He states if her fetal monitoring shows no evidence of labor or fetal distress she can follow-up in the office.  He did not feel like a need to do a  vaginal exam if she was not having symptoms.  Nursing staff [facility] not find [facility] tracing.  Patient left the ED AMA.  Final Clinical Impressions(s) / ED Diagnoses   Final diagnoses:  [redacted] weeks gestation of pregnancy    Pt left AMA  Devoria AlbeIva Taquila Leys, MD, Con[facility] PyoFACEP    Shermon Bozzi, MD 12/08/17 [phone]

## 2017-12-08 NOTE — Telephone Encounter (Signed)
Patient states she has lost her mucous plug and wanted to make us aware.  Informed patient the mucous plu can be lost days or even weeks before the baby is born but to continue to monitor for leaking.  States she was given instructions on pre-eclampsia precautions but does not have a f/u appt scheduled or visit until next week.  KRB reviewed chart and patient to come in Friday for BP check and visit with provider.   Advised again in she develops a severe headache unresolved by Tylenol, blurred vision, spots, floaters, or right upper gastric pain, to call us or [facility] to [facility].  Pt verbalized understanding.

## 2017-12-08 NOTE — Progress Notes (Signed)
[facility] called about patient who presents to [facility] ED with complaints of losing her mucus plug; patient denies pain, LOF, or bleeding; patient is a G3P1 who is 34 and 4/[redacted] weeks along in her pregnancy; patient admitted into obix to be monitored for NST; obix not picking up patient; ED RN called back stating patient was leaving AMA to [facility] to [facility].

## 2017-12-10 ENCOUNTER — Ambulatory Visit (INDEPENDENT_AMBULATORY_CARE_PROVIDER_SITE_OTHER): Payer: Medicaid Other | Admitting: Obstetrics & Gynecology

## 2017-12-10 ENCOUNTER — Other Ambulatory Visit: Payer: Self-pay

## 2017-12-10 ENCOUNTER — Encounter: Payer: Self-pay | Admitting: Obstetrics & Gynecology

## 2017-12-10 VITALS — BP 134/87 | HR 105 | Wt 135.0 lb

## 2017-12-10 DIAGNOSIS — Z331 Pregnant state, incidental: Secondary | ICD-10-CM

## 2017-12-10 DIAGNOSIS — O0993 Supervision of high risk pregnancy, unspecified, third trimester: Secondary | ICD-10-CM

## 2017-12-10 DIAGNOSIS — O1213 Gestational proteinuria, third trimester: Secondary | ICD-10-CM

## 2017-12-10 DIAGNOSIS — Z1389 Encounter for screening for other disorder: Secondary | ICD-10-CM

## 2017-12-10 DIAGNOSIS — Z3A34 34 weeks gestation of pregnancy: Secondary | ICD-10-CM | POA: Diagnosis not present

## 2017-12-10 LAB — POCT URINALYSIS DIPSTICK
GLUCOSE UA: NEGATIVE
Ketones, UA: NEGATIVE
Nitrite, UA: NEGATIVE
Protein, UA: POSITIVE — AB
RBC UA: NEGATIVE

## 2017-12-10 NOTE — Progress Notes (Signed)
   HIGH-RISK PREGNANCY VISIT Patient name: Sheila [name] MRN [id number]  Date of birth: 08/08/1990 Chief Complaint:   Blood Pressure Check  History of Present Illness:   Sheila [name] is a 27 y.o. [phone] female at 5330w6d with an Estimated Date of Delivery: 01/15/18 being seen today for ongoing management of a high-risk pregnancy complicated by borderline BP with elevated Pr/Cr ration, possible evolving pre eclmapsia.  Today she reports no headache or CNS symptoms, sees sparkles a few seconds a day, does not persist more than a couple of seconds, associated with head movement Contractions: Irregular. Vag. Bleeding: None.  Movement: Present. denies leaking of fluid.  Review of Systems:   Pertinent items are noted in HPI Denies abnormal vaginal discharge w/ itching/odor/irritation, headaches, visual changes, shortness of breath, chest pain, abdominal pain, severe nausea/vomiting, or problems with urination or bowel movements unless otherwise stated above. Pertinent History Reviewed:  Reviewed past medical,surgical, social, obstetrical and family history.  Reviewed problem list, medications and allergies. Physical Assessment:   Vitals:   12/10/17 1053  BP: 134/87  Pulse: (!) 105  Weight: 135 lb (61.2 kg)  Body mass index is 24.69 kg/m.           Physical Examination:   General appearance: alert, well appearing, and in no distress  Mental status: alert, oriented to person, place, and time  Skin: warm & dry   Extremities: Edema: None    Cardiovascular: normal heart rate noted  Respiratory: normal respiratory effort, no distress  Abdomen: gravid, soft, non-tender  Pelvic: Cervical exam deferred         Fetal Status:     Movement: Present    Fetal Surveillance Testing today:    Results for orders placed or performed in visit on 12/10/17 (from the past 24 hour(s))  POCT urinalysis dipstick   Collection Time: 12/10/17 10:55 AM  Result Value Ref Range   Color, UA     Clarity, UA       Glucose, UA Negative Negative   Bilirubin, UA     Ketones, UA neg    Spec Grav, UA  1.010 - 1.025   Blood, UA neg    pH, UA  5.0 - 8.0   Protein, UA Positive (A) Negative   Urobilinogen, UA  0.2 or 1.0 E.U./dL   Nitrite, UA neg    Leukocytes, UA Small (1+) (A) Negative   Appearance     Odor      Assessment & Plan:  1) High-risk pregnancy [id number] at 3930w6d with an Estimated Date of Delivery: 01/15/18   2) Possible evolving pre eclampsia, unstable, collect 24 hour urine, see back in 3 days for NST, visit    Meds: No orders of the defined types were placed in this encounter.   Labs/procedures today: FHR 144  Treatment Plan:  Short interval eval, check 24 hour urine, NST/visit 3 days  Reviewed: Preterm labor symptoms and general obstetric precautions including but not limited to vaginal bleeding, contractions, leaking of fluid and fetal movement were reviewed in detail with the patient.  All questions were answered.  Follow-up: No follow-ups on file.  Orders Placed This Encounter  Procedures  . Protein, urine, 24 hour  . POCT urinalysis dipstick   Lazaro ArmsLuther H Jezreel Justiniano CNM, [facility]BC 12/10/2017 11:09 AM

## 2017-12-11 LAB — PROTEIN / CREATININE RATIO, URINE
Creatinine, Urine: 69.8 mg/dL
Protein, Ur: 81.3 mg/dL
Protein/Creat Ratio: 1165 mg/g creat — ABNORMAL HIGH (ref 0–200)

## 2017-12-13 DIAGNOSIS — O1213 Gestational proteinuria, third trimester: Secondary | ICD-10-CM | POA: Diagnosis not present

## 2017-12-14 ENCOUNTER — Ambulatory Visit: Payer: Medicaid Other

## 2017-12-14 ENCOUNTER — Ambulatory Visit (INDEPENDENT_AMBULATORY_CARE_PROVIDER_SITE_OTHER): Payer: Medicaid Other | Admitting: Obstetrics & Gynecology

## 2017-12-14 ENCOUNTER — Encounter: Payer: Self-pay | Admitting: Obstetrics & Gynecology

## 2017-12-14 VITALS — BP 123/84 | HR 102 | Wt 138.0 lb

## 2017-12-14 DIAGNOSIS — Z331 Pregnant state, incidental: Secondary | ICD-10-CM

## 2017-12-14 DIAGNOSIS — O1493 Unspecified pre-eclampsia, third trimester: Secondary | ICD-10-CM | POA: Diagnosis not present

## 2017-12-14 DIAGNOSIS — Z3A35 35 weeks gestation of pregnancy: Secondary | ICD-10-CM | POA: Diagnosis not present

## 2017-12-14 DIAGNOSIS — Z1389 Encounter for screening for other disorder: Secondary | ICD-10-CM

## 2017-12-14 DIAGNOSIS — O0993 Supervision of high risk pregnancy, unspecified, third trimester: Secondary | ICD-10-CM

## 2017-12-14 LAB — POCT URINALYSIS DIPSTICK
Blood, UA: NEGATIVE
GLUCOSE UA: NEGATIVE
Ketones, UA: NEGATIVE
LEUKOCYTES UA: NEGATIVE
Nitrite, UA: NEGATIVE
Protein, UA: POSITIVE — AB

## 2017-12-14 LAB — PROTEIN, URINE, 24 HOUR
PROTEIN 24H UR: 1110 mg/(24.h) — AB (ref 30–150)
PROTEIN UR: 44.4 mg/dL

## 2017-12-14 NOTE — Progress Notes (Signed)
   HIGH-RISK PREGNANCY VISIT Patient name: Sheila [name] MRN [id number]  Date of birth: 07/02/1990 Chief Complaint:   High Risk Gestation (NST)  History of Present Illness:   Sheila NippleChynna L [name] is a 27 y.o. [phone] female at 2478w3d with an Estimated Date of Delivery: 01/15/18 being seen today for ongoing management of a high-risk pregnancy complicated by pre-eclampsia.  Today she reports no complaints. Contractions: Irregular. Vag. Bleeding: None.  Movement: Present. denies leaking of fluid.  Review of Systems:   Pertinent items are noted in HPI Denies abnormal vaginal discharge w/ itching/odor/irritation, headaches, visual changes, shortness of breath, chest pain, abdominal pain, severe nausea/vomiting, or problems with urination or bowel movements unless otherwise stated above. Pertinent History Reviewed:  Reviewed past medical,surgical, social, obstetrical and family history.  Reviewed problem list, medications and allergies. Physical Assessment:   Vitals:   12/14/17 1352  BP: 123/84  Pulse: (!) 102  Weight: 138 lb (62.6 kg)  Body mass index is 25.24 kg/m.           Physical Examination:   General appearance: alert, well appearing, and in no distress  Mental status: alert, oriented to person, place, and time  Skin: warm & dry   Extremities: Edema: None    Cardiovascular: normal heart rate noted  Respiratory: normal respiratory effort, no distress  Abdomen: gravid, soft, non-tender  Pelvic: Cervical exam deferred         Fetal Status:     Movement: Present    Fetal Surveillance Testing today: reactive NST   No results found for this or any previous visit (from the past 24 hour(s)).  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   Labs/procedures today: Reactive NST  Treatment Plan:  Twice weekly assessments, scheduled for repeat C section at 37 weeks  Reviewed: Term labor symptoms and general obstetric precautions including but not limited to vaginal bleeding, contractions, leaking of fluid and fetal movement were reviewed in detail with the patient.  All questions were answered.  Follow-up: Return in about 3 days (around 12/17/2017) for BPP/sono, EFW, HROB, with Dr Despina HiddenEure.  Orders Placed This Encounter  Procedures  . US Fetal BPP W/O Non Stress  . US UA Cord Doppler  . US OB Follow Up  . POCT urinalysis dipstick   Amaryllis DykeLuther H Zhanae Proffit  12/28/2017 5:00 PM

## 2017-12-15 ENCOUNTER — Encounter (HOSPITAL_COMMUNITY): Payer: Self-pay

## 2017-12-15 ENCOUNTER — Telehealth: Payer: Self-pay | Admitting: Advanced Practice Midwife

## 2017-12-15 NOTE — Telephone Encounter (Signed)
35 weeks having contractions would like to speak to a nurse

## 2017-12-15 NOTE — Telephone Encounter (Signed)
Patient states she was contracting frequently last night but feels like they have slowed down today.  Not having any leaking or bleeding.  Informed patient since contractions had spaced out, more than likely were braxton hicks contractions.  Encouraged patient to push extra fluids, rest and try a warm bath.  Informed that unless cervix was changing, she was not in labor and false contractions could last several days or even weeks.  Verbalized understanding.

## 2017-12-16 ENCOUNTER — Other Ambulatory Visit: Payer: Self-pay

## 2017-12-16 ENCOUNTER — Encounter (HOSPITAL_COMMUNITY): Payer: Self-pay

## 2017-12-16 ENCOUNTER — Encounter (HOSPITAL_COMMUNITY)
Admission: AD | Disposition: A | Discharge status: Home / Self Care | Payer: Self-pay | Source: Home / Self Care | Attending: Obstetrics and Gynecology

## 2017-12-16 ENCOUNTER — Inpatient Hospital Stay (HOSPITAL_COMMUNITY): Payer: Medicaid Other | Admitting: Anesthesiology

## 2017-12-16 ENCOUNTER — Inpatient Hospital Stay (HOSPITAL_COMMUNITY)
Admission: AD | Admit: 2017-12-16 | Discharge: 2017-12-19 | DRG: 786 | Disposition: A | Discharge status: Home / Self Care | Payer: Medicaid Other | Attending: Obstetrics and Gynecology | Admitting: Obstetrics and Gynecology

## 2017-12-16 DIAGNOSIS — O3403 Maternal care for unspecified congenital malformation of uterus, third trimester: Secondary | ICD-10-CM | POA: Diagnosis present

## 2017-12-16 DIAGNOSIS — O34211 Maternal care for low transverse scar from previous cesarean delivery: Secondary | ICD-10-CM | POA: Diagnosis present

## 2017-12-16 DIAGNOSIS — Q512 Other doubling of uterus, unspecified: Secondary | ICD-10-CM

## 2017-12-16 DIAGNOSIS — O42913 Preterm premature rupture of membranes, unspecified as to length of time between rupture and onset of labor, third trimester: Principal | ICD-10-CM | POA: Diagnosis present

## 2017-12-16 DIAGNOSIS — O134 Gestational [pregnancy-induced] hypertension without significant proteinuria, complicating childbirth: Secondary | ICD-10-CM | POA: Diagnosis present

## 2017-12-16 DIAGNOSIS — Z98891 History of uterine scar from previous surgery: Secondary | ICD-10-CM

## 2017-12-16 DIAGNOSIS — Z3A35 35 weeks gestation of pregnancy: Secondary | ICD-10-CM | POA: Diagnosis not present

## 2017-12-16 DIAGNOSIS — Z3493 Encounter for supervision of normal pregnancy, unspecified, third trimester: Secondary | ICD-10-CM

## 2017-12-16 DIAGNOSIS — O42919 Preterm premature rupture of membranes, unspecified as to length of time between rupture and onset of labor, unspecified trimester: Secondary | ICD-10-CM | POA: Diagnosis present

## 2017-12-16 DIAGNOSIS — Z87891 Personal history of nicotine dependence: Secondary | ICD-10-CM | POA: Diagnosis not present

## 2017-12-16 DIAGNOSIS — Z3A00 Weeks of gestation of pregnancy not specified: Secondary | ICD-10-CM | POA: Diagnosis not present

## 2017-12-16 DIAGNOSIS — F1290 Cannabis use, unspecified, uncomplicated: Secondary | ICD-10-CM | POA: Diagnosis present

## 2017-12-16 DIAGNOSIS — Z9882 Breast implant status: Secondary | ICD-10-CM

## 2017-12-16 DIAGNOSIS — O99324 Drug use complicating childbirth: Secondary | ICD-10-CM | POA: Diagnosis present

## 2017-12-16 LAB — CBC
HCT: 38.1 % (ref 36.0–46.0)
HEMOGLOBIN: 12.8 g/dL (ref 12.0–15.0)
MCH: 26.6 pg (ref 26.0–34.0)
MCHC: 33.6 g/dL (ref 30.0–36.0)
MCV: 79.2 fL (ref 78.0–100.0)
PLATELETS: 212 10*3/uL (ref 150–400)
RBC: 4.81 MIL/uL (ref 3.87–5.11)
RDW: 12.4 % (ref 11.5–15.5)
WBC: 16.4 10*3/uL — ABNORMAL HIGH (ref 4.0–10.5)

## 2017-12-16 LAB — CBC WITH DIFFERENTIAL/PLATELET
BASOS ABS: 0 10*3/uL (ref 0.0–0.1)
BASOS PCT: 0 %
EOS PCT: 0 %
Eosinophils Absolute: 0 10*3/uL (ref 0.0–0.7)
HEMATOCRIT: 34.3 % — AB (ref 36.0–46.0)
Hemoglobin: 12.1 g/dL (ref 12.0–15.0)
LYMPHS PCT: 8 %
Lymphs Abs: 2.2 10*3/uL (ref 0.7–4.0)
MCH: 29.9 pg (ref 26.0–34.0)
MCHC: 35.3 g/dL (ref 30.0–36.0)
MCV: 84.7 fL (ref 78.0–100.0)
MONO ABS: 0.9 10*3/uL (ref 0.1–1.0)
Monocytes Relative: 4 %
NEUTROS ABS: 22.6 10*3/uL — AB (ref 1.7–7.7)
Neutrophils Relative %: 88 %
PLATELETS: 92 10*3/uL — AB (ref 150–400)
RBC: 4.05 MIL/uL (ref 3.87–5.11)
RDW: 13.5 % (ref 11.5–15.5)
WBC: 25.7 10*3/uL — ABNORMAL HIGH (ref 4.0–10.5)

## 2017-12-16 LAB — URINALYSIS, ROUTINE W REFLEX MICROSCOPIC
Bilirubin Urine: NEGATIVE
Glucose, UA: NEGATIVE mg/dL
Hgb urine dipstick: NEGATIVE
KETONES UR: NEGATIVE mg/dL
Nitrite: NEGATIVE
PH: 7 (ref 5.0–8.0)
Protein, ur: 30 mg/dL — AB
SPECIFIC GRAVITY, URINE: 1.008 (ref 1.005–1.030)

## 2017-12-16 LAB — PREPARE RBC (CROSSMATCH)

## 2017-12-16 LAB — POSTPARTUM HEMORRHAGE PROTOCOL (BB NOTIFICATION)

## 2017-12-16 LAB — POCT FERN TEST: POCT Fern Test: POSITIVE

## 2017-12-16 SURGERY — Surgical Case
Anesthesia: Spinal

## 2017-12-16 MED ORDER — OXYTOCIN 10 UNIT/ML IJ SOLN
INTRAMUSCULAR | Status: DC | PRN
  Administered 2017-12-16: 40 [IU] via INTRAVENOUS

## 2017-12-16 MED ORDER — NALBUPHINE HCL 10 MG/ML IJ SOLN
5.0000 mg | Freq: Once | INTRAMUSCULAR | Status: DC | PRN
Start: 2017-12-16 — End: 2017-12-19

## 2017-12-16 MED ORDER — COCONUT OIL OIL
1.0000 "application " | TOPICAL_OIL | Status: DC | PRN
  Administered 2017-12-19: 1 via TOPICAL
  Filled 2017-12-16 (×2): qty 120

## 2017-12-16 MED ORDER — SODIUM CHLORIDE 0.9% FLUSH: 3.0000 mL | INTRAVENOUS | Status: DC | PRN

## 2017-12-16 MED ORDER — MEPERIDINE HCL 25 MG/ML IJ SOLN
6.2500 mg | INTRAMUSCULAR | Status: DC | PRN
Start: 2017-12-16 — End: 2017-12-16

## 2017-12-16 MED ORDER — DIBUCAINE 1 % RE OINT
1.0000 "application " | TOPICAL_OINTMENT | RECTAL | Status: DC | PRN
  Filled 2017-12-16: qty 28

## 2017-12-16 MED ORDER — PRENATAL MULTIVITAMIN CH
1.0000 | ORAL_TABLET | Freq: Every day | ORAL | Status: DC
  Administered 2017-12-16 – 2017-12-19 (×4): 1 via ORAL
  Filled 2017-12-16 (×6): qty 1

## 2017-12-16 MED ORDER — SODIUM CHLORIDE 0.9 % IV SOLN
INTRAVENOUS | Status: DC | PRN
  Administered 2017-12-16: 05:00:00 via INTRAVENOUS

## 2017-12-16 MED ORDER — SODIUM CHLORIDE 0.9% IV SOLUTION: Freq: Once | INTRAVENOUS | Status: DC

## 2017-12-16 MED ORDER — PROPOFOL 10 MG/ML IV BOLUS
INTRAVENOUS | Status: AC
  Filled 2017-12-16: qty 20

## 2017-12-16 MED ORDER — CEFAZOLIN SODIUM-DEXTROSE 2-4 GM/100ML-% IV SOLN
INTRAVENOUS | Status: AC
  Filled 2017-12-16: qty 100

## 2017-12-16 MED ORDER — SODIUM CHLORIDE 0.9 % IV SOLN
3.0000 g | Freq: Three times a day (TID) | INTRAVENOUS | Status: AC
  Administered 2017-12-16 – 2017-12-17 (×3): 3 g via INTRAVENOUS
  Filled 2017-12-16 (×3): qty 3

## 2017-12-16 MED ORDER — METOCLOPRAMIDE HCL 5 MG/ML IJ SOLN
INTRAMUSCULAR | Status: AC
  Filled 2017-12-16: qty 2

## 2017-12-16 MED ORDER — SCOPOLAMINE 1 MG/3DAYS TD PT72
MEDICATED_PATCH | TRANSDERMAL | Status: AC
Start: 2017-12-16 — End: ?
  Filled 2017-12-16: qty 1

## 2017-12-16 MED ORDER — NALOXONE HCL 4 MG/10ML IJ SOLN
1.0000 ug/kg/h | INTRAVENOUS | Status: DC | PRN
  Filled 2017-12-16: qty 5

## 2017-12-16 MED ORDER — SIMETHICONE 80 MG PO CHEW
80.0000 mg | CHEWABLE_TABLET | ORAL | Status: DC | PRN
  Filled 2017-12-16: qty 1

## 2017-12-16 MED ORDER — ONDANSETRON HCL 4 MG/2ML IJ SOLN: 4.0000 mg | Freq: Four times a day (QID) | INTRAMUSCULAR | Status: DC | PRN

## 2017-12-16 MED ORDER — CEFAZOLIN SODIUM-DEXTROSE 2-4 GM/100ML-% IV SOLN
2.0000 g | Freq: Once | INTRAVENOUS | Status: AC
  Administered 2017-12-16: 2 g via INTRAVENOUS
  Filled 2017-12-16: qty 100

## 2017-12-16 MED ORDER — OXYTOCIN 10 UNIT/ML IJ SOLN
INTRAMUSCULAR | Status: AC
  Filled 2017-12-16: qty 4

## 2017-12-16 MED ORDER — LACTATED RINGERS IV SOLN
INTRAVENOUS | Status: DC | PRN
  Administered 2017-12-16: 05:00:00 via INTRAVENOUS

## 2017-12-16 MED ORDER — DEXAMETHASONE SODIUM PHOSPHATE 10 MG/ML IJ SOLN
INTRAMUSCULAR | Status: AC
  Filled 2017-12-16: qty 1

## 2017-12-16 MED ORDER — PHENYLEPHRINE 8 MG IN D5W 100 ML (0.08MG/ML) PREMIX OPTIME
INJECTION | INTRAVENOUS | Status: DC | PRN
  Administered 2017-12-16: 60 ug/min via INTRAVENOUS

## 2017-12-16 MED ORDER — BUPIVACAINE IN DEXTROSE 0.75-8.25 % IT SOLN
INTRATHECAL | Status: DC | PRN
  Administered 2017-12-16: 1.5 mL via INTRATHECAL

## 2017-12-16 MED ORDER — MIDAZOLAM HCL 2 MG/2ML IJ SOLN
INTRAMUSCULAR | Status: DC | PRN
  Administered 2017-12-16: 2 mg via INTRAVENOUS

## 2017-12-16 MED ORDER — SODIUM CHLORIDE 0.9% IV SOLUTION
Freq: Once | INTRAVENOUS | Status: AC
  Administered 2017-12-16: 09:00:00 via INTRAVENOUS

## 2017-12-16 MED ORDER — ONDANSETRON HCL 4 MG/2ML IJ SOLN
INTRAMUSCULAR | Status: DC | PRN
  Administered 2017-12-16: 4 mg via INTRAVENOUS

## 2017-12-16 MED ORDER — MIDAZOLAM HCL 2 MG/2ML IJ SOLN
INTRAMUSCULAR | Status: AC
  Filled 2017-12-16: qty 2

## 2017-12-16 MED ORDER — MENTHOL 3 MG MT LOZG
1.0000 | LOZENGE | OROMUCOSAL | Status: DC | PRN
  Filled 2017-12-16: qty 9

## 2017-12-16 MED ORDER — ONDANSETRON HCL 4 MG/2ML IJ SOLN: 4.0000 mg | Freq: Three times a day (TID) | INTRAMUSCULAR | Status: DC | PRN

## 2017-12-16 MED ORDER — CEFAZOLIN SODIUM-DEXTROSE 2-3 GM-%(50ML) IV SOLR
INTRAVENOUS | Status: DC | PRN
  Administered 2017-12-16: 2 g via INTRAVENOUS

## 2017-12-16 MED ORDER — CHLOROPROCAINE HCL (PF) 3 % IJ SOLN
INTRAMUSCULAR | Status: AC
Start: 2017-12-16 — End: ?
  Filled 2017-12-16: qty 20

## 2017-12-16 MED ORDER — HYDROCODONE-ACETAMINOPHEN 7.5-325 MG PO TABS
1.0000 | ORAL_TABLET | Freq: Once | ORAL | Status: DC | PRN
Start: 2017-12-16 — End: 2017-12-16

## 2017-12-16 MED ORDER — NALBUPHINE HCL 10 MG/ML IJ SOLN: 5.0000 mg | Freq: Once | INTRAMUSCULAR | Status: DC | PRN

## 2017-12-16 MED ORDER — ZOLPIDEM TARTRATE 5 MG PO TABS
5.0000 mg | ORAL_TABLET | Freq: Every evening | ORAL | Status: DC | PRN
Start: 2017-12-16 — End: 2017-12-19

## 2017-12-16 MED ORDER — SCOPOLAMINE 1 MG/3DAYS TD PT72: 1.0000 | MEDICATED_PATCH | Freq: Once | TRANSDERMAL | Status: DC

## 2017-12-16 MED ORDER — SODIUM CHLORIDE 0.9 % IR SOLN
Status: DC | PRN
  Administered 2017-12-16: 1

## 2017-12-16 MED ORDER — TRANEXAMIC ACID 1000 MG/10ML IV SOLN
1000.0000 mg | INTRAVENOUS | Status: AC
  Administered 2017-12-16: 1000 mg via INTRAVENOUS
  Filled 2017-12-16: qty 1100

## 2017-12-16 MED ORDER — FENTANYL CITRATE (PF) 100 MCG/2ML IJ SOLN
INTRAMUSCULAR | Status: AC
  Filled 2017-12-16: qty 2

## 2017-12-16 MED ORDER — OXYTOCIN 40 UNITS IN LACTATED RINGERS INFUSION - SIMPLE MED: 2.5000 [IU]/h | INTRAVENOUS | Status: AC

## 2017-12-16 MED ORDER — LACTATED RINGERS IV SOLN
INTRAVENOUS | Status: DC
  Administered 2017-12-16 (×5): via INTRAVENOUS

## 2017-12-16 MED ORDER — DIPHENHYDRAMINE HCL 50 MG/ML IJ SOLN
INTRAMUSCULAR | Status: AC
  Filled 2017-12-16: qty 1

## 2017-12-16 MED ORDER — KETOROLAC TROMETHAMINE 30 MG/ML IJ SOLN
30.0000 mg | Freq: Four times a day (QID) | INTRAMUSCULAR | Status: AC | PRN
  Administered 2017-12-16: 30 mg via INTRAVENOUS
  Filled 2017-12-16: qty 1

## 2017-12-16 MED ORDER — SODIUM CHLORIDE 0.9 % IV BOLUS
500.0000 mL | Freq: Once | INTRAVENOUS | Status: AC
  Administered 2017-12-16: 500 mL via INTRAVENOUS

## 2017-12-16 MED ORDER — SODIUM CHLORIDE 0.9 % IV SOLN: INTRAVENOUS | Status: DC

## 2017-12-16 MED ORDER — PHENYLEPHRINE HCL 10 MG/ML IJ SOLN
INTRAMUSCULAR | Status: DC | PRN
  Administered 2017-12-16: 120 ug via INTRAVENOUS
  Administered 2017-12-16: 80 ug via INTRAVENOUS

## 2017-12-16 MED ORDER — SENNOSIDES-DOCUSATE SODIUM 8.6-50 MG PO TABS
2.0000 | ORAL_TABLET | ORAL | Status: DC
  Administered 2017-12-16 – 2017-12-19 (×3): 2 via ORAL
  Filled 2017-12-16 (×5): qty 2

## 2017-12-16 MED ORDER — OXYTOCIN BOLUS FROM INFUSION: 500.0000 mL | Freq: Once | INTRAVENOUS | Status: DC

## 2017-12-16 MED ORDER — KETOROLAC TROMETHAMINE 30 MG/ML IJ SOLN: 30.0000 mg | Freq: Four times a day (QID) | INTRAMUSCULAR | Status: AC | PRN

## 2017-12-16 MED ORDER — ACETAMINOPHEN 10 MG/ML IV SOLN: 1000.0000 mg | Freq: Once | INTRAVENOUS | Status: DC | PRN

## 2017-12-16 MED ORDER — HYDROMORPHONE HCL 1 MG/ML IJ SOLN
0.2500 mg | INTRAMUSCULAR | Status: DC | PRN
Start: 2017-12-16 — End: 2017-12-16

## 2017-12-16 MED ORDER — DIPHENHYDRAMINE HCL 25 MG PO CAPS
25.0000 mg | ORAL_CAPSULE | ORAL | Status: DC | PRN
  Filled 2017-12-16: qty 1

## 2017-12-16 MED ORDER — DIPHENHYDRAMINE HCL 25 MG PO CAPS
25.0000 mg | ORAL_CAPSULE | Freq: Four times a day (QID) | ORAL | Status: DC | PRN
  Administered 2017-12-16 – 2017-12-17 (×2): 25 mg via ORAL
  Filled 2017-12-16 (×3): qty 1

## 2017-12-16 MED ORDER — FAMOTIDINE IN NACL 20-0.9 MG/50ML-% IV SOLN
INTRAVENOUS | Status: AC
  Administered 2017-12-16: 04:00:00
  Filled 2017-12-16: qty 50

## 2017-12-16 MED ORDER — NALBUPHINE HCL 10 MG/ML IJ SOLN: 5.0000 mg | INTRAMUSCULAR | Status: DC | PRN

## 2017-12-16 MED ORDER — MORPHINE SULFATE (PF) 0.5 MG/ML IJ SOLN
INTRAMUSCULAR | Status: AC
Start: 2017-12-16 — End: ?
  Filled 2017-12-16: qty 10

## 2017-12-16 MED ORDER — TETANUS-DIPHTH-ACELL PERTUSSIS 5-2.5-18.5 LF-MCG/0.5 IM SUSP
0.5000 mL | Freq: Once | INTRAMUSCULAR | Status: DC
  Filled 2017-12-16: qty 0.5

## 2017-12-16 MED ORDER — SOD CITRATE-CITRIC ACID 500-334 MG/5ML PO SOLN
30.0000 mL | ORAL | Status: DC | PRN
  Administered 2017-12-16: 30 mL via ORAL
  Filled 2017-12-16: qty 15

## 2017-12-16 MED ORDER — LACTATED RINGERS IV SOLN
INTRAVENOUS | Status: DC
  Administered 2017-12-16 (×2): via INTRAVENOUS
  Administered 2017-12-17: 125 mL/h via INTRAVENOUS

## 2017-12-16 MED ORDER — STERILE WATER FOR IRRIGATION IR SOLN
Status: DC | PRN
  Administered 2017-12-16: 1000 mL

## 2017-12-16 MED ORDER — OXYTOCIN 40 UNITS IN LACTATED RINGERS INFUSION - SIMPLE MED: 2.5000 [IU]/h | INTRAVENOUS | Status: DC

## 2017-12-16 MED ORDER — DIPHENHYDRAMINE HCL 50 MG/ML IJ SOLN: 12.5000 mg | INTRAMUSCULAR | Status: DC | PRN

## 2017-12-16 MED ORDER — MEPERIDINE HCL 25 MG/ML IJ SOLN
INTRAMUSCULAR | Status: AC
  Filled 2017-12-16: qty 1

## 2017-12-16 MED ORDER — IBUPROFEN 600 MG PO TABS
600.0000 mg | ORAL_TABLET | Freq: Four times a day (QID) | ORAL | Status: DC
  Administered 2017-12-17 – 2017-12-19 (×10): 600 mg via ORAL
  Filled 2017-12-16 (×10): qty 1

## 2017-12-16 MED ORDER — SIMETHICONE 80 MG PO CHEW
80.0000 mg | CHEWABLE_TABLET | Freq: Three times a day (TID) | ORAL | Status: DC
  Administered 2017-12-16 – 2017-12-19 (×11): 80 mg via ORAL
  Filled 2017-12-16 (×17): qty 1

## 2017-12-16 MED ORDER — LIDOCAINE HCL (PF) 1 % IJ SOLN: 30.0000 mL | INTRAMUSCULAR | Status: DC | PRN

## 2017-12-16 MED ORDER — PROMETHAZINE HCL 25 MG/ML IJ SOLN: 6.2500 mg | INTRAMUSCULAR | Status: DC | PRN

## 2017-12-16 MED ORDER — DEXAMETHASONE SODIUM PHOSPHATE 10 MG/ML IJ SOLN
INTRAMUSCULAR | Status: DC | PRN
  Administered 2017-12-16: 10 mg via INTRAVENOUS

## 2017-12-16 MED ORDER — LACTATED RINGERS IV SOLN: 500.0000 mL | INTRAVENOUS | Status: DC | PRN

## 2017-12-16 MED ORDER — PROPOFOL 10 MG/ML IV BOLUS
INTRAVENOUS | Status: DC | PRN
  Administered 2017-12-16: 10 mg via INTRAVENOUS

## 2017-12-16 MED ORDER — SCOPOLAMINE 1 MG/3DAYS TD PT72
MEDICATED_PATCH | TRANSDERMAL | Status: DC | PRN
  Administered 2017-12-16: 1 via TRANSDERMAL

## 2017-12-16 MED ORDER — MORPHINE SULFATE (PF) 0.5 MG/ML IJ SOLN
INTRAMUSCULAR | Status: DC | PRN
  Administered 2017-12-16: 1 mg via EPIDURAL
  Administered 2017-12-16: 1.5 mg via EPIDURAL
  Administered 2017-12-16: .2 mg via INTRATHECAL
  Administered 2017-12-16: 2.1 mg via EPIDURAL

## 2017-12-16 MED ORDER — BETAMETHASONE SOD PHOS & ACET 6 (3-3) MG/ML IJ SUSP
12.0000 mg | Freq: Once | INTRAMUSCULAR | Status: AC
  Administered 2017-12-16: 12 mg via INTRAMUSCULAR
  Filled 2017-12-16: qty 2

## 2017-12-16 MED ORDER — MEPERIDINE HCL 25 MG/ML IJ SOLN
INTRAMUSCULAR | Status: DC | PRN
  Administered 2017-12-16 (×2): 12.5 mg via INTRAVENOUS

## 2017-12-16 MED ORDER — SIMETHICONE 80 MG PO CHEW
80.0000 mg | CHEWABLE_TABLET | ORAL | Status: DC
  Administered 2017-12-16 – 2017-12-19 (×3): 80 mg via ORAL
  Filled 2017-12-16 (×5): qty 1

## 2017-12-16 MED ORDER — WITCH HAZEL-GLYCERIN EX PADS: 1.0000 "application " | MEDICATED_PAD | CUTANEOUS | Status: DC | PRN

## 2017-12-16 MED ORDER — NALOXONE HCL 0.4 MG/ML IJ SOLN: 0.4000 mg | INTRAMUSCULAR | Status: DC | PRN

## 2017-12-16 MED ORDER — ONDANSETRON HCL 4 MG/2ML IJ SOLN
INTRAMUSCULAR | Status: AC
  Filled 2017-12-16: qty 2

## 2017-12-16 MED ORDER — PHENYLEPHRINE 8 MG IN D5W 100 ML (0.08MG/ML) PREMIX OPTIME
INJECTION | INTRAVENOUS | Status: AC
  Filled 2017-12-16: qty 100

## 2017-12-16 MED ORDER — SODIUM CHLORIDE 0.9 % IJ SOLN
INTRAMUSCULAR | Status: AC
Start: 2017-12-16 — End: ?
  Filled 2017-12-16: qty 20

## 2017-12-16 MED ORDER — ACETAMINOPHEN 325 MG PO TABS
650.0000 mg | ORAL_TABLET | ORAL | Status: DC | PRN
  Administered 2017-12-16 – 2017-12-17 (×2): 650 mg via ORAL
  Filled 2017-12-16 (×2): qty 2

## 2017-12-16 MED ORDER — FENTANYL CITRATE (PF) 100 MCG/2ML IJ SOLN
INTRAMUSCULAR | Status: DC | PRN
  Administered 2017-12-16: 100 ug via INTRAVENOUS

## 2017-12-16 SURGICAL SUPPLY — 47 items
BENZOIN TINCTURE PRP APPL 2/3 (GAUZE/BANDAGES/DRESSINGS) ×3 IMPLANT
CATH FOLEY 2WAY SLVR 30CC 22FR (CATHETERS) ×6 IMPLANT
CHLORAPREP W/TINT 26ML (MISCELLANEOUS) ×6 IMPLANT
CLAMP CORD UMBIL (MISCELLANEOUS) IMPLANT
CLOSURE STERI-STRIP 1/2X4 (GAUZE/BANDAGES/DRESSINGS) ×1
CLOTH BEACON ORANGE TIMEOUT ST (SAFETY) ×3 IMPLANT
CLSR STERI-STRIP ANTIMIC 1/2X4 (GAUZE/BANDAGES/DRESSINGS) ×2 IMPLANT
DERMABOND ADVANCED (GAUZE/BANDAGES/DRESSINGS) ×4
DERMABOND ADVANCED .7 DNX12 (GAUZE/BANDAGES/DRESSINGS) ×2 IMPLANT
DRSG OPSITE POSTOP 4X10 (GAUZE/BANDAGES/DRESSINGS) ×3 IMPLANT
ELECT REM PT RETURN 9FT ADLT (ELECTROSURGICAL) ×3
ELECTRODE REM PT RTRN 9FT ADLT (ELECTROSURGICAL) ×1 IMPLANT
EXTRACTOR VACUUM BELL STYLE (SUCTIONS) IMPLANT
GLOVE BIOGEL PI IND STRL 7.0 (GLOVE) ×1 IMPLANT
GLOVE BIOGEL PI IND STRL 8 (GLOVE) ×1 IMPLANT
GLOVE BIOGEL PI INDICATOR 7.0 (GLOVE) ×2
GLOVE BIOGEL PI INDICATOR 8 (GLOVE) ×2
GLOVE ECLIPSE 8.0 STRL XLNG CF (GLOVE) ×3 IMPLANT
GOWN STRL REUS W/TWL LRG LVL3 (GOWN DISPOSABLE) ×6 IMPLANT
KIT ABG SYR 3ML LUER SLIP (SYRINGE) ×3 IMPLANT
NEEDLE HYPO 18GX1.5 BLUNT FILL (NEEDLE) ×3 IMPLANT
NEEDLE HYPO 22GX1.5 SAFETY (NEEDLE) ×3 IMPLANT
NEEDLE HYPO 25X5/8 SAFETYGLIDE (NEEDLE) ×3 IMPLANT
NS IRRIG 1000ML POUR BTL (IV SOLUTION) ×3 IMPLANT
PACK C SECTION WH (CUSTOM PROCEDURE TRAY) ×3 IMPLANT
PAD OB MATERNITY 4.3X12.25 (PERSONAL CARE ITEMS) ×3 IMPLANT
PENCIL SMOKE EVAC W/HOLSTER (ELECTROSURGICAL) ×3 IMPLANT
PLUG CATH AND CAP STER (CATHETERS) ×6 IMPLANT
RETRACTOR WND ALEXIS 25 LRG (MISCELLANEOUS) ×1 IMPLANT
RTRCTR C-SECT PINK 25CM LRG (MISCELLANEOUS) IMPLANT
RTRCTR WOUND ALEXIS 25CM LRG (MISCELLANEOUS) ×3
SPONGE LAP 18X18 RF (DISPOSABLE) ×18 IMPLANT
SUT CHROMIC 0 CT 1 (SUTURE) ×3 IMPLANT
SUT MNCRL 0 VIOLET CTX 36 (SUTURE) ×7 IMPLANT
SUT MONOCRYL 0 CTX 36 (SUTURE) ×14
SUT PLAIN 2 0 (SUTURE)
SUT PLAIN 2 0 XLH (SUTURE) IMPLANT
SUT PLAIN ABS 2-0 CT1 27XMFL (SUTURE) IMPLANT
SUT VIC AB 0 CT1 27 (SUTURE) ×2
SUT VIC AB 0 CT1 27XBRD ANBCTR (SUTURE) ×1 IMPLANT
SUT VIC AB 0 CTX 36 (SUTURE) ×2
SUT VIC AB 0 CTX36XBRD ANBCTRL (SUTURE) ×1 IMPLANT
SUT VIC AB 4-0 KS 27 (SUTURE) IMPLANT
SYR 20CC LL (SYRINGE) ×6 IMPLANT
SYR 50ML LL SCALE MARK (SYRINGE) ×6 IMPLANT
TOWEL OR 17X24 6PK STRL BLUE (TOWEL DISPOSABLE) ×3 IMPLANT
TRAY FOLEY W/BAG SLVR 14FR LF (SET/KITS/TRAYS/PACK) IMPLANT

## 2017-12-16 NOTE — Transfer of Care (Signed)
Immediate Anesthesia Transfer of Care Note  Patient: Sheila [name]  Procedure(s) Performed: REPEAT CESAREAN SECTION (N/A )  Patient Location: PACU  Anesthesia Type:Spinal  Level of Consciousness: awake, alert  and oriented  Airway & Oxygen Therapy: Patient Spontanous Breathing  Post-op Assessment: Report given to RN and Post -op Vital signs reviewed and stable  Post vital signs: Reviewed and stable  Last Vitals:  Vitals Value Taken Time  BP 108/65 12/16/2017  6:30 AM  Temp    Pulse 112 12/16/2017  6:34 AM  Resp 21 12/16/2017  6:34 AM  SpO2 100 % 12/16/2017  6:34 AM  Vitals shown include unvalidated device data.  Last Pain:  Vitals:   12/16/17 0152  PainSc: 5          Complications: No apparent anesthesia complications

## 2017-12-16 NOTE — Lactation Note (Signed)
This note was copied from a baby's chart. Lactation Consultation Note: Called to assist with breast feeding. By the time I got here, she had been fed formula, because she would not latch. Assisted with first pumping, Reviewed setup, use and cleaning of pump pieces with mom and grandmother. Mom pumping as I left room. Mom very sleepy. Reviewed feeding cues and encouraged to feed whenever she sees them or at least every 3 hours. No questions at present.   Patient Name: Sheila Fernanda DrumChynna Serio [id number]'XToday's Date: 12/16/2017 Reason for consult: Initial assessment;Late-preterm 34-36.6wks   Maternal Data    Feeding    LATCH Score                   Interventions    Lactation Tools Discussed/Used     Consult Status Consult Status: Follow-up Date: 12/17/17 Follow-up type: In-patient    Pamelia HoitWeeks, Edith Groleau D 12/16/2017, 11:03 AM

## 2017-12-16 NOTE — Anesthesia Postprocedure Evaluation (Signed)
Anesthesia Post Note  Patient: Sheila [name]  Procedure(s) Performed: REPEAT CESAREAN SECTION (N/A )     Patient location during evaluation: PACU Anesthesia Type: Spinal Level of consciousness: oriented and awake and alert Pain management: pain level controlled Vital Signs Assessment: post-procedure vital signs reviewed and stable Respiratory status: spontaneous breathing, respiratory function stable and patient connected to nasal cannula oxygen Cardiovascular status: blood pressure returned to baseline and stable Postop Assessment: no headache, no backache and no apparent nausea or vomiting Anesthetic complications: no    Last Vitals:  Vitals:   12/16/17 0901 12/16/17 0934  BP: 113/80 115/77  Pulse: 100 (!) 105  Resp: 18 16  Temp: 36.7 C 37.1 C  SpO2:  96%    Last Pain:  Vitals:   12/16/17 0934  TempSrc: Oral  PainSc:    Pain Goal: Patients Stated Pain Goal: 3 (12/16/17 0915)               Trevor IhaStephen A Houser

## 2017-12-16 NOTE — Anesthesia Preprocedure Evaluation (Signed)
Anesthesia Evaluation  Patient identified by MRN, date of birth, ID band Patient awake    Reviewed: Allergy & Precautions, NPO status , Patient's Chart, lab work & pertinent test results  Airway Mallampati: II  TM Distance: >3 FB Neck ROM: Full    Dental no notable dental hx. (+) Teeth Intact   Pulmonary neg pulmonary ROS, former smoker,    Pulmonary exam normal breath sounds clear to auscultation       Cardiovascular negative cardio ROS Normal cardiovascular exam Rhythm:Regular Rate:Normal     Neuro/Psych Anxiety negative neurological ROS     GI/Hepatic negative GI ROS,   Endo/Other    Renal/GU      Musculoskeletal   Abdominal   Peds  Hematology   Anesthesia Other Findings   Reproductive/Obstetrics (+) Pregnancy                             Lab Results  Component Value Date   WBC 16.0 (H) 12/08/2017   HGB 11.8 (L) 12/08/2017   HCT 35.1 (L) 12/08/2017   MCV 79.4 12/08/2017   PLT 230 12/08/2017    Anesthesia Physical Anesthesia Plan  ASA: II  Anesthesia Plan: Spinal   Post-op Pain Management:    Induction:   PONV Risk Score and Plan: Treatment may vary due to age or medical condition, Ondansetron, Dexamethasone and Scopolamine patch - Pre-op  Airway Management Planned: Mask, Natural Airway and Nasal Cannula  Additional Equipment:   Intra-op Plan:   Post-operative Plan:   Informed Consent: I have reviewed the patients History and Physical, chart, labs and discussed the procedure including the risks, benefits and alternatives for the proposed anesthesia with the patient or authorized representative who has indicated his/her understanding and acceptance.   Dental advisory given  Plan Discussed with: CRNA and Anesthesiologist  Anesthesia Plan Comments:         Anesthesia Quick Evaluation

## 2017-12-16 NOTE — H&P (Signed)
OBSTETRIC ADMISSION HISTORY AND PHYSICAL  Sheila [name] is a 27 y.o. female [phone] with IUP at 4849w5d by 8wk u/s presenting for preterm premature rupture of membrane. She reports constant contraction -type pains all day today which she described as 'constant',  +FM's approximately 4 hours ago, loss of fluid at about 0030 this morning, no VB, no blurry vision, headaches or peripheral edema, no RUQ pain, no SOB. She states she was recently diagnosed with mild Pre-E on her last prenatal visit due to mildly elevated pressures and Pr:Cr ratio.  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 She plans on breast feeding. She states she has no plans  for birth control. [facility]   Sono:    @[redacted]w[redacted]d , CWD, normal anatomy,  presentation, cephalic lie, 243g, 57%% EFW   Prenatal History/Complications:  Past Medical History: Past Medical History:  Diagnosis Date  . Anxiety   . Hematuria 11/07/2014  . 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 Nosebleed 09/06/2013  . Pain with urination 11/07/2014  . Round ligament pain 09/06/2013    Past Surgical History: Past Surgical History:  Procedure Laterality Date  . Bilateral breast implants  12/2011    . CESAREAN SECTION N/A 12/21/2013   Procedure: CESAREAN SECTION;  Surgeon: Lazaro ArmsLuther H Eure, MD;  Location: [facility];  Service: Obstetrics;  Laterality: N/A;  . TONSILLECTOMY      Obstetrical History: OB History    Gravida  3   Para  1   Term      Preterm  1   AB  1   Living  1     SAB  1   TAB      Ectopic      Multiple      Live Births  1           Social History: Social History   Socioeconomic History  . Marital status: Single    Spouse name: Not on file  . Number of children: Not on file  . Years of education: Not on file  . Highest education level: Not on  file  Occupational History  . Not on file  Social Needs  . Financial resource strain: Not on file  . Food insecurity:    Worry: Not on file    Inability: Not on file  . Transportation needs:    Medical: Not on file    Non-medical: Not on file  Tobacco Use  . Smoking status: Former Smoker    Types: Cigarettes  . Smokeless tobacco: Never Used  Substance and Sexual Activity  . Alcohol use: No    Alcohol/week: 1.2 oz    Types: 2 Shots of liquor per week  . Drug use: No  . Sexual activity: Yes    Birth control/protection: None  Lifestyle  . Physical activity:    Days per week: Not on file    Minutes per session: Not on file  . Stress: Not on file  Relationships  . Social connections:    Talks on phone: Not on file    Gets together: Not on file    Attends religious service: Not on file    Active member of club or organization: Not on file    Attends meetings of clubs or organizations: Not on file  Relationship status: Not on file  Other Topics Concern  . Not on file  Social History Narrative  . Not on file    Family History: Family History  Problem Relation Age of Onset  . Cancer Maternal Grandmother   . Cancer Maternal Grandfather     Allergies: No Known Allergies  Facility-Administered Medications Prior to Admission  Medication Dose Route Frequency Provider Last Rate Last Dose  . hydroxyprogesterone caproate (MAKENA) 250 mg/mL injection 250 mg  250 mg Intramuscular Once Lazaro Arms, MD       Medications Prior to Admission  Medication Sig Dispense Refill Last Dose  . Prenatal Vit-Fe Fumarate-FA (PRENATAL MULTIVITAMIN) TABS tablet Take 1 tablet by mouth daily at 12 noon.   12/15/2017 at Unknown time  . TRAZODONE HCL PO Take by mouth at bedtime as needed.    Taking     Review of Systems   All systems reviewed and negative except as stated in HPI  Blood pressure 109/61, pulse (!) 143, temperature 97.6 F (36.4 C), last menstrual period 03/30/2017, SpO2  97 %. General appearance: alert, cooperative, appears stated age and mild distress Lungs: clear to auscultation bilaterally Heart: regular rate and rhythm Abdomen: gravid abdomen Extremities:no sign of DVT Presentation: unsure Fetal monitoringBaseline: 150 bpm, Variability: Good {> 6 bpm), Accelerations: Reactive and Decelerations: Occurs rare Uterine activity: rare contractions     Prenatal labs: ABO, Rh: A/Positive/-- (01/28 1508) Antibody: Negative (05/28 0926) Rubella: 10.50 (01/28 1508) RPR: Non Reactive (05/28 0926)  HBsAg: Negative (01/28 1508)  HIV: Non Reactive (05/28 0926)  GBS:    GTT: 71/144/94 Genetic screening  NT neg Anatomy US normal  Prenatal Transfer Tool  Maternal Diabetes: No Genetic Screening: Normal Maternal Ultrasounds/Referrals: Normal Fetal Ultrasounds or other Referrals:  None Maternal Substance Abuse:  No Significant Maternal Medications:  None Significant Maternal Lab Results: Lab values include: Group B Strep negative  No results found for this or any previous visit (from the past 24 hour(s)).  Patient Active Problem List   Diagnosis Date Noted  . Marijuana use 06/30/2017  . Supervision of normal pregnancy 06/28/2017  . History of preterm delivery, currently pregnant 06/28/2017  . History of cervical incompetence in pregnancy, currently pregnant 06/28/2017  . Right ovarian cyst 06/10/2017  . Stress and adjustment reaction 01/22/2014  . History of cesarean section 12/21/2013  . 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 [id number] at [redacted]w[redacted]d here for preterm, premature rupture of membranes, having occurred @ 0030 this morning.  Patient also has a didelphys uterus for which she was scheduled to have a c-section on 7/28.    #Labor:patient to have c/s given her uterine anatomical condition. Will get dose of betamethasone.  Will monitor pressures and signs/sx of pre-E.   #Pain: None currently  #FWB: Cat I #ID:  GBS unknown #MOF: breast #MOC:none.   Sandre Kitty, MD  12/16/2017, 2:35 AM

## 2017-12-16 NOTE — Addendum Note (Signed)
Addendum  created 12/16/17 1345 by Earmon PhoenixWilkerson, Jeslyn Amsler P, CRNA   Sign clinical note

## 2017-12-16 NOTE — MAU Note (Signed)
Pt states SROM clear at 0030. Denies bleeding. +FM. Schedule c-sect 7/28.   Last ate at 1930 on 7/17- Chicken fettucine Water at 2300

## 2017-12-16 NOTE — Progress Notes (Signed)
Subjective: Postpartum Day 0: Cesarean Delivery Patient reports incisional pain.  Pt very fatigued. IV fluids have been d/c'd and will be restarted with a fluid bolus. I& O to be monitored q 1 h for next 12 hrs.  Objective: Vital signs in last 24 hours: Temp:  [97.6 F (36.4 C)-98.8 F (37.1 C)] 98 F (36.7 C) (07/18 0901) Pulse Rate:  [100-143] 100 (07/18 0901) Resp:  [11-18] 18 (07/18 0901) BP: (105-115)/(61-80) 113/80 (07/18 0901) SpO2:  [95 %-100 %] 97 % (07/18 0750) Weight:  [138 lb (62.6 kg)] 138 lb (62.6 kg) (07/17 1455)  Intake/Output Summary (Last 24 hours) at 12/16/2017 0921 Last data filed at 12/16/2017 0915 Gross per 24 hour  Intake 6980.5 ml  Output 3580 ml  Net 3400.5 ml    Physical Exam:  General: cooperative, fatigued and pale Lochia: appropriate no blood per vagina at present Uterine Fundus: firm at u-0 , line marked at surgery Incision: no significant drainage, no dehiscence DVT Evaluation: No evidence of DVT seen on physical exam.  Recent Labs    12/16/17 0248  HGB 12.8  HCT 38.1    Assessment/Plan: Status post Cesarean section. Postoperative course complicated by excess blood loss , not completely equilibrated  1. Complete Transfusion 3rd and 4th units. 2 500 cc bolus NS then 125 cc/hr thru second IV site. 3 one unit FFP to be administered 4 unasyn 3 g q 8h  X 24 hr due to extensive nature of surgery, and drains, and internal exposure to vaginal bacteria. 5  I& O q 1 h for now, may reduce as pt improves 6 cbc ordered for 2 pm, as well as in the a.m. 7 leave uterine foleys in til the a.m. MD to remove 8 Bladder foley til the a.m also.Sheila [name].  Sheila [name] 12/16/2017, 9:15 AM

## 2017-12-16 NOTE — Anesthesia Procedure Notes (Signed)
Spinal  Patient location during procedure: OB Start time: 12/16/2017 3:55 AM End time: 12/16/2017 4:04 AM Staffing Anesthesiologist: Trevor IhaHouser, Shamarion Coots A, MD Performed: anesthesiologist  Preanesthetic Checklist Completed: patient identified, surgical consent, pre-op evaluation, timeout performed, IV checked, risks and benefits discussed and monitors and equipment checked Spinal Block Patient position: sitting Prep: site prepped and draped and DuraPrep Patient monitoring: heart rate, cardiac monitor, continuous pulse ox and blood pressure Approach: midline Location: L3-4 Injection technique: single-shot Needle Needle type: Pencan  Needle gauge: 24 G Needle length: 10 cm Assessment Sensory level: T4

## 2017-12-16 NOTE — Anesthesia Postprocedure Evaluation (Signed)
Anesthesia Post Note  Patient: Sheila [name]  Procedure(s) Performed: REPEAT CESAREAN SECTION (N/A )     Patient location during evaluation: [facility] Anesthesia Type: Spinal Level of consciousness: awake and alert Pain management: pain level controlled Vital Signs Assessment: post-procedure vital signs reviewed and stable Respiratory status: spontaneous breathing Cardiovascular status: stable Postop Assessment: patient able to bend at knees, spinal receding and no apparent nausea or vomiting Anesthetic complications: no    Last Vitals:  Vitals:   12/16/17 1135 12/16/17 1201  BP: 112/68 113/68  Pulse: 90 86  Resp: 14 14  Temp: 36.8 C 36.8 C  SpO2: 100% 96%    Last Pain:  Vitals:   12/16/17 1330  TempSrc:   PainSc: 2    Pain Goal: Patients Stated Pain Goal: 3 (12/16/17 1330)               Edison PaceWILKERSON,Kamarii Carton

## 2017-12-16 NOTE — Lactation Note (Signed)
This note was copied from a baby's chart. Lactation Consultation Note: attempted to make initial visit with mom. RN getting ready to start blood on mom for pp hemorrhage. Baby born at 35.5 [name]. Has had formula by bottle. BF brochure and LPTI information left for mom. Encouraged to start pumping as soon as she feels able. RN will get her started pumping. No questions at present.    Patient Name: Girl Sheila DrumChynna [name] [id number]'UToday's Date: 12/16/2017 Reason for consult: Initial assessment;Late-preterm 34-36.6wks   Maternal Data Formula Feeding for Exclusion: No  Feeding Feeding Type: Formula Nipple Type: Regular  LATCH Score                   Interventions    Lactation Tools Discussed/Used     Consult Status Consult Status: Follow-up Date: 12/17/17 Follow-up type: In-patient    Pamelia HoitWeeks, Nethaniel Mattie D 12/16/2017, 9:12 AM

## 2017-12-16 NOTE — Op Note (Signed)
Please see the brief operative note for details 

## 2017-12-16 NOTE — Op Note (Addendum)
12/16/2017  6:41 AM  PATIENT:  Sheila [name]  27 y.o. female  PRE-OPERATIVE DIAGNOSIS: Premature rupture of membranes 35 weeks 5 days, prior cesarean section for repeat cesarean, uterus didelphys,  POST-OPERATIVE DIAGNOSIS: Repeat low transverse CESAREAN SECTION complicated by a avulsion of inferior portion of midline septum, intraoperative hemorrhage necessitating transfusion x4 units And placement of bakri balloon PROCEDURE:  Procedure(s): REPEAT CESAREAN SECTION (N/A), repair of midline cervical septum, transfusion 4 units packed cells, placement of 2 intrauterine Foley balloons for assistance with incision compression  SURGEON:  Surgeon(s) and Role:    Christin Bach MD  PHYSICIAN ASSISTANT: Catalina Antigua MD  ASSISTANTSMardella Layman-----, PA student  ANESTHESIA:   spinal  EBL:  2880 mL   BLOOD ADMINISTERED:2800 cc CC PRBC  DRAINS: Urinary Catheter (Foley) and To intrauterine Foley balloons and 1 in each uterus   LOCAL MEDICATIONS USED:  NONE  SPECIMEN:   placenta to labor and delivery DISPOSITION OF SPECIMEN:  Labor and delivery  COUNTS:  YES  TOURNIQUET:  * No tourniquets in log *  DICTATION: .Dragon Dictation  PLAN OF CARE: Admit to inpatient   PATIENT DISPOSITION:  PACU - hemodynamically stable.  Will receive third and fourth unit in recovery   Delay start of Pharmacological VTE agent (>24hrs) due to surgical blood loss or risk of bleeding: not applicable Indications: 27 year old female gravida 3 para 0-1-1-1, who presented contracting with ruptured membranes.  She was a prior cesarean section for known uterine didelphys.  She had PIH diagnosis earlier and was scheduled for cesarean section at 37 weeks.  Patient received betamethasone and MAU in preparation for the surgery Details of procedure: Patient taken the operating room prepped and draped, with spinal anesthesia in place.  Timeout was conducted procedure confirmed by surgical team.  Transverse abdominal  incision was made along the prior cesarean section Pfannenstiel incision type, with sharp dissection down to the fascia which was opened in a arcuate fashion, the peritoneal cavity entered in the midline and the bladder flap noted to be adherent over the lower uterine segment and taken down it.  These were postoperative adhesions.  The uterine incision was made just above the previous scar that appeared to be from the first C-section, and the baby was noted to be in occiput posterior presentation, with the incision at the level of the fetal cheek.  The fetus was rotated into the incision and delivered relatively easily with fundal pressure. At 1 minute the cord was clamped and the baby passed off.  There was a generous amount of bleeding welling from the incision.  The edges of the incision were checked during the 1 minute time in the lateral aspects of the incision were not bleeding especially heavily but from the the depths of the incision there was a generous amount of bleeding which was controlled intermittently with [place] not.  The cord blood samples were obtained after the baby passed off at 1 minute, and then the placenta delivered with moderate difficulty from its right sided uterine position.  Uterine tone was excellent.  The blood welling up from the incision was technically challenging to address.  Intermittent inspection revealed distorted anatomy with some asymmetric flaps of soft tissue in the lower portion of the incision.  There was very edematous anterior flap to the uterine incision.  It was determined that the incision was at the level of the surgical cervical vaginal fornix.  With essentially entered in to the uterus at the level of the cervical vaginal fornix.  When adequate visualization could be obtained it was appeared that the midline septum had a avulsed from the posterior aspects of its attachments and the base of the bleeding was very posterior.  Type and crossmatch was initiated and due  to continued bleeding O- blood was requested under the postpartum hemorrhage protocol Initially a couple of figure-of-eight sutures could be placed in the midline reapproximating the inferior aspects of the incision to what appeared to be its previous attachments to the posterior vagina which made tremendous differences in the active bleeding.  [place] nod was done for period of time.  Tranexamic acid 1 g was administered. Assistant was call for Dr. Jolayne Pantheronstant to assist.  Efforts to continue reestablishment anatomy included placement of a figure-of-eight suture in each lateral aspect of the transverse incision and then efforts to reestablish normal anatomy in the midline.  We were able to establish that there was a incomplete intact midline septum and the uterus which consisted of the medial aspects of the cervix.  Once the figure-of-eight sutures reattaching the posterior aspects of this midline septum to the posterior vagina and completed we were able to identify both the 2 uterine cavities which appeared to be within one single uterine body as well as to vaginal canals on either side of a full-length vaginal septum. Upon Dr. Deretha Emoryonstant's arrival further efforts to establish anatomy and control future bleeding were performed.  A 60 cc Foley bulb was placed in each uterine body, and allowed to egress through the respective vagina canal and the tip of the Foley left inside the uterus, with the balloon dilated.  The left Foley was dilated to 30 cc, the right Foley dilated to 60 cc and both were allowed to egress through the vagina.  The left Foley was tagged with a suture around its inflation port Hemostasis this point was satisfactory and transfusions had been essentially completed.  The front side of the uterus could be closed in a more standard fashion reestablishing anatomy and closing the uterine/vaginal incision with a single layer closure.  We were well away from the bladder.  The urine remained clear.  All  uterine closures were done with 0 Monocryl Single layer closure achieved good hemostasis.  Abdomen was irrigated minimally as patient tolerated poorly.  She had received 30 cc of 3% Nesacaine much early in the case intra-abdominal placement. Laparotomy count was correct and anterior peritoneum closed with 2-0 Vicryl closure of the peritoneum, 0 Vicryl closure of the fascia, erupted 2-0 Vicryl closure and subcutaneous space which was quite small and then subcuticular 4-0 Vicryl closure of the skin with Steri-Strips and benzoin and honeycomb gauze placed in position.  Vital signs are stable and urine output considered adequate at 300 cc during the case.  The patient appeared to have a pallor and estimated blood loss with total of 2800 cc on final count so 2 additional units packed cells will be administered in recovery.

## 2017-12-17 ENCOUNTER — Encounter: Payer: Medicaid Other | Admitting: Obstetrics & Gynecology

## 2017-12-17 ENCOUNTER — Other Ambulatory Visit: Payer: Medicaid Other

## 2017-12-17 ENCOUNTER — Telehealth: Payer: Self-pay | Admitting: *Deleted

## 2017-12-17 LAB — CBC
HEMATOCRIT: 28 % — AB (ref 36.0–46.0)
HEMOGLOBIN: 10.2 g/dL — AB (ref 12.0–15.0)
MCH: 30.8 pg (ref 26.0–34.0)
MCHC: 36.4 g/dL — AB (ref 30.0–36.0)
MCV: 84.6 fL (ref 78.0–100.0)
Platelets: 88 10*3/uL — ABNORMAL LOW (ref 150–400)
RBC: 3.31 MIL/uL — ABNORMAL LOW (ref 3.87–5.11)
RDW: 14.1 % (ref 11.5–15.5)
WBC: 22.1 10*3/uL — ABNORMAL HIGH (ref 4.0–10.5)

## 2017-12-17 LAB — PREPARE FRESH FROZEN PLASMA: UNIT DIVISION: 0

## 2017-12-17 LAB — BPAM FFP
BLOOD PRODUCT EXPIRATION DATE: 201907262359
ISSUE DATE / TIME: 201907181137
Unit Type and Rh: 6200

## 2017-12-17 MED ORDER — OXYCODONE-ACETAMINOPHEN 5-325 MG PO TABS
1.0000 | ORAL_TABLET | ORAL | Status: DC | PRN
  Administered 2017-12-17: 1 via ORAL
  Administered 2017-12-17 (×2): 2 via ORAL
  Administered 2017-12-18 – 2017-12-19 (×3): 1 via ORAL
  Filled 2017-12-17: qty 1
  Filled 2017-12-17: qty 2
  Filled 2017-12-17 (×2): qty 1
  Filled 2017-12-17: qty 2
  Filled 2017-12-17: qty 1

## 2017-12-17 NOTE — Telephone Encounter (Signed)
Tried to contact pt yesterday and today to set up a future postpartum appointment.  Unable to leave a message.  Her voice mail is full.  12-17-17  AS

## 2017-12-17 NOTE — Lactation Note (Signed)
This note was copied from a baby's chart. Lactation Consultation Note  Patient Name: Sheila [name] ZHYQM'VToday's Date: 12/17/2017 Reason for consult: Infant weight loss;Follow-up assessment;Late-preterm 34-36.6wks;Infant < 6lbs  35 hours old female who is now being mostly formula fed by her mother. Mom took a break on BF/pumping last night because she wasn't getting "anything". Explained to mom that the purpose of pumping at this point is just to get the stimulation at the breast; that she's not supposed to get volume yet, stressed the importance of consistent pumping in order to have her milk come in sooner since she had a baby born at 1635 5/7 weeks. Noticed that tubing on the pump was lose, LC adjusted tubing and let mom know that she'll feel a difference in the suction level.  Discussed the benefits of premature milk Vs Formula and Vs donor milk; mom voiced understanding and said she's going to start pumping again, she actually pumped twice today but didn't get any volume. Instructed mom to finger feed any drops of colostrum she may get from baby before offering Similac 22. Mom will continue supplementing in the meantime until she starts getting more volume of breastmilk.  Grandma present and supportive she had lots of BF questions, discussed the benefits of breastfeeding, STS and when to re-introduce baby to the breast again. Encouraged mom to call for latch assistance; she's aware of LC services and will contact PRN.  Maternal Data Formula Feeding for Exclusion: Yes Reason for exclusion: Previous breast surgery (mastectomy, reduction, or augmentation where mother is unable to produce breast milk);Mother's choice to formula feed on admision  Feeding      Interventions Interventions: Breast feeding basics reviewed;DEBP  Lactation Tools Discussed/Used     Consult Status Consult Status: Follow-up Date: 12/18/17 Follow-up type: In-patient    Sheila [name] 12/17/2017, 4:20  PM

## 2017-12-17 NOTE — Progress Notes (Signed)
Subjective: Postpartum Day 1: Cesarean Delivery Patient reports incisional pain and tolerating PO.    Objective: Vital signs in last 24 hours: Temp:  [98.2 F (36.8 C)-99.1 F (37.3 C)] 98.6 F (37 C) (07/19 0331) Pulse Rate:  [57-100] 57 (07/19 0756) Resp:  [14-18] 18 (07/19 0756) BP: (109-117)/(67-74) 109/67 (07/19 0756) SpO2:  [96 %-100 %] 98 % (07/19 0756)  Physical Exam:  General: alert, cooperative and no distress Lochia: appropriate Uterine Fundus: firm at umbilicus Incision: no significant drainage DVT Evaluation: No evidence of DVT seen on physical exam. Intrauterine Foley catheters removed Recent Labs    12/16/17 1408 12/17/17 0551  HGB 12.1 10.2*  HCT 34.3* 28.0*    Assessment/Plan: Status post Cesarean section. Doing well postoperatively.  Continue current care.  Scheryl DarterJames Arnold 12/17/2017, 10:16 AM

## 2017-12-17 NOTE — Progress Notes (Signed)
CSW received consult for hx of marijuana use.  Referral was screened out due to the following: ~[name] had no documented substance use after initial prenatal visit/+UPT. ~Baby's UDS is negative.  Please consult CSW if current concerns arise or by [name]'s request.  CSW will monitor CDS results and make report to Child Protective Services if warranted.

## 2017-12-17 NOTE — Progress Notes (Signed)
Dr. Debroah LoopArnold at bedside removed both foley bulbs.  Pt tol well.   Foley to gravity drg cl yellow urine

## 2017-12-18 LAB — TYPE AND SCREEN
ABO/RH(D): A POS
ANTIBODY SCREEN: NEGATIVE
UNIT DIVISION: 0
UNIT DIVISION: 0
UNIT DIVISION: 0
Unit division: 0
Unit division: 0
Unit division: 0

## 2017-12-18 LAB — BPAM RBC
BLOOD PRODUCT EXPIRATION DATE: 201908102359
BLOOD PRODUCT EXPIRATION DATE: 201908102359
Blood Product Expiration Date: 201907272359
Blood Product Expiration Date: 201907302359
Blood Product Expiration Date: 201908112359
Blood Product Expiration Date: 201908112359
ISSUE DATE / TIME: 201907180506
ISSUE DATE / TIME: 201907180630
ISSUE DATE / TIME: 201907180506
ISSUE DATE / TIME: 201907180630
UNIT TYPE AND RH: 6200
Unit Type and Rh: 5100
Unit Type and Rh: 6200
Unit Type and Rh: 6200
Unit Type and Rh: 6200
Unit Type and Rh: 6200

## 2017-12-18 NOTE — Progress Notes (Signed)
Subjective: Postpartum Day 2: Cesarean Delivery with repair of uterine septum  Pt sitting up in chair breast pumping. She is without complaints this morning. Ambulating and voiding without problems. Tolerating diet. Pain controlled.     Objective: Vital signs in last 24 hours: Temp:  [98.3 F (36.8 C)-98.6 F (37 C)] 98.3 F (36.8 C) (07/20 40980632) Pulse Rate:  [57-100] 85 (07/20 0632) Resp:  [17-18] 18 (07/20 11910632) BP: (102-123)/(67-78) 110/78 (07/20 [id number]) SpO2:  [96 %-100 %] 100 % (07/20 [id number])  Physical Exam:  General: alert Lochia: appropriate Uterine Fundus: firm Incision: drsg intact DVT Evaluation: No evidence of DVT seen on physical exam.  Recent Labs    12/16/17 1408 12/17/17 0551  HGB 12.1 10.2*  HCT 34.3* 28.0*    Assessment/Plan: Status post Cesarean section. Doing well postoperatively.  Continue current care.  Sheila [name] 12/18/2017, 7:22 AM

## 2017-12-18 NOTE — Lactation Note (Signed)
This note was copied from a baby'Sheila chart. Lactation Consultation Note  Patient Name: Sheila [name]   Mom feeding formula on arrival.  Infant spitty with formula at this feed.Showed mom how to pace bottle feed.  Mom reports she was crying earlier and she could not get her to quiiet so she put her to breast and she breastfed and fell asleep. Mom reports she did not document that and not exactly sure what time. Mom reports she does not know if she got anything.  Mom has pumped two times since yesterday.  Urged mom to breastfeed first/pump and feed back emm and formula as prescribed.  Urged mom to let someone else feed back emm or formula.  Offer whatever infant wants,  So if she wants more after prescribed amt/feed her/ Urged her to give any emm drops to infant.  Exp they were like medicine for her.  Reviewed late preterm guidelines with mom/ mom reports she has breastpump for home use. Left name and number white board.  Explained to mom that even though she is not getting much with pumping right now that it is stimulating more to come.  Maternal Data    Feeding Feeding Type: Bottle Fed - Formula Nipple Type: Slow - flow LATCH Score                   Interventions    Lactation Tools Discussed/Used     Consult Status      Sheila [name] Sheila [name] [name], 1:07 PM

## 2017-12-19 ENCOUNTER — Ambulatory Visit: Payer: Self-pay

## 2017-12-19 LAB — COMPREHENSIVE METABOLIC PANEL
ALT: 12 U/L (ref 0–44)
ANION GAP: 10 (ref 5–15)
AST: 26 U/L (ref 15–41)
Albumin: 2.1 g/dL — ABNORMAL LOW (ref 3.5–5.0)
Alkaline Phosphatase: 77 U/L (ref 38–126)
BUN: 17 mg/dL (ref 6–20)
CALCIUM: 8 mg/dL — AB (ref 8.9–10.3)
CO2: 22 mmol/L (ref 22–32)
CREATININE: 0.67 mg/dL (ref 0.44–1.00)
Chloride: 104 mmol/L (ref 98–111)
Glucose, Bld: 73 mg/dL (ref 70–99)
Potassium: 4 mmol/L (ref 3.5–5.1)
Sodium: 136 mmol/L (ref 135–145)
Total Bilirubin: 0.6 mg/dL (ref 0.3–1.2)
Total Protein: 5.4 g/dL — ABNORMAL LOW (ref 6.5–8.1)

## 2017-12-19 LAB — CBC WITH DIFFERENTIAL/PLATELET
BASOS PCT: 0 %
Basophils Absolute: 0 10*3/uL (ref 0.0–0.1)
EOS PCT: 2 %
Eosinophils Absolute: 0.3 10*3/uL (ref 0.0–0.7)
HCT: 29.5 % — ABNORMAL LOW (ref 36.0–46.0)
HEMOGLOBIN: 10.3 g/dL — AB (ref 12.0–15.0)
Lymphocytes Relative: 24 %
Lymphs Abs: 3.6 10*3/uL (ref 0.7–4.0)
MCH: 30.4 pg (ref 26.0–34.0)
MCHC: 34.9 g/dL (ref 30.0–36.0)
MCV: 87 fL (ref 78.0–100.0)
MONO ABS: 0.5 10*3/uL (ref 0.1–1.0)
MONOS PCT: 3 %
Neutro Abs: 11 10*3/uL — ABNORMAL HIGH (ref 1.7–7.7)
Neutrophils Relative %: 71 %
PLATELETS: 151 10*3/uL (ref 150–400)
RBC: 3.39 MIL/uL — ABNORMAL LOW (ref 3.87–5.11)
RDW: 14.3 % (ref 11.5–15.5)
WBC: 15.4 10*3/uL — ABNORMAL HIGH (ref 4.0–10.5)

## 2017-12-19 LAB — RPR: RPR: NONREACTIVE

## 2017-12-19 MED ORDER — SIMETHICONE 80 MG PO CHEW: 80.0000 mg | CHEWABLE_TABLET | ORAL | 0 refills | Status: DC | PRN

## 2017-12-19 MED ORDER — IBUPROFEN 600 MG PO TABS: 600.0000 mg | ORAL_TABLET | Freq: Four times a day (QID) | ORAL | 0 refills | Status: DC | PRN

## 2017-12-19 MED ORDER — OXYCODONE-ACETAMINOPHEN 5-325 MG PO TABS: 1.0000 | ORAL_TABLET | Freq: Four times a day (QID) | ORAL | 0 refills | Status: DC | PRN

## 2017-12-19 NOTE — Progress Notes (Signed)
Discharge instructions and prescriptions given to pt. Discussed post-op care, signs and symptoms to report to the MD, upcoming appointments, and meds. Pt verbalized understanding and has no questions or concerns at this time.

## 2017-12-19 NOTE — Discharge Instructions (Signed)
°Cesarean Delivery, Care After °Refer to this sheet in the next few weeks. These instructions provide you with information on caring for yourself after your procedure. Your health care provider may also give you specific instructions. Your treatment has been planned according to current medical practices, but problems sometimes occur. Call your health care provider if you have any problems or questions after you go home. °HOME CARE INSTRUCTIONS  °· Only take over-the-counter or prescription medications as directed by your health care provider. °· Do not drink alcohol, especially if you are breastfeeding or taking medication to relieve pain. °· Do not  smoke tobacco. °· Continue to use good perineal care. Good perineal care includes: °¨ Wiping your perineum from front to back. °¨ Keeping your perineum clean. °· Check your surgical cut (incision) daily for increased redness, drainage, swelling, or separation of skin. °· Shower and clean your incision gently with soap and water every day, by letting warm and soapy water run over the incision, and then pat it dry. If your health care provider says it is okay, leave the incision uncovered. Use a bandage (dressing) if the incision is draining fluid or appears irritated. If the adhesive strips across the incision do not fall off within 7 days, carefully peel them off, after a shower. °· Hug a pillow when coughing or sneezing until your incision is healed. This helps to relieve pain. °· Do not use tampons, douches or have sexual intercourse, until your health care provider says it is okay. °· Wear a well-fitting bra that provides breast support. °· Limit wearing support panties or control-top hose. °· Drink enough fluids to keep your urine clear or pale yellow. °· Eat high-fiber foods such as whole grain cereals and breads, brown rice, beans, and fresh fruits and vegetables every day. These foods may help prevent or relieve constipation. °· Resume activities such as  climbing stairs, driving, lifting, exercising, or traveling as directed by your health care provider. °· Try to have someone help you with your household activities and your newborn for at least a few days after you leave the hospital. °· Rest as much as possible. Try to rest or take a nap when your newborn is sleeping. °· Increase your activities gradually. °· Do not lift more than 15lbs until directed by a provider. °· Keep all of your scheduled postpartum appointments. It is very important to keep your scheduled follow-up appointments. At these appointments, your health care provider will be checking to make sure that you are healing physically and emotionally. °SEEK MEDICAL CARE IF:  °· You are passing large clots from your vagina. Save any clots to show your health care provider. °· You have a foul smelling discharge from your vagina. °· You have trouble urinating. °· You are urinating frequently. °· You have pain when you urinate. °· You have a change in your bowel movements. °· You have increasing redness, pain, or swelling near your incision. °· You have pus draining from your incision. °· Your incision is separating. °· You have painful, hard, or reddened breasts. °· You have a severe headache. °· You have blurred vision or see spots. °· You feel sad or depressed. °· You have thoughts of hurting yourself or your newborn. °· You have questions about your care, the care of your newborn, or medications. °· You are dizzy or light-headed. °· You have a rash. °· You have pain, redness, or swelling at the site of the removed intravenous access (IV) tube. °· You have nausea   or vomiting. °· You stopped breastfeeding and have not had a menstrual period within 12 weeks of stopping. °· You are not breastfeeding and have not had a menstrual period within 12 weeks of delivery. °· You have a fever. °SEEK IMMEDIATE MEDICAL CARE IF: °· You have persistent pain. °· You have chest pain. °· You have shortness of breath. °· You  faint. °· You have leg pain. °· You have stomach pain. °· Your vaginal bleeding saturates 2 or more sanitary pads in 1 hour. °MAKE SURE YOU:  °· Understand these instructions. °· Will watch your condition. °· Will get help right away if you are not doing well or get worse. °Document Released: 02/07/2002 Document Revised: 10/02/2013 Document Reviewed: 01/13/2012 °ExitCare® Patient Information ©2015 [employer]. This information is not intended to replace advice given to you by your health care provider. Make sure you discuss any questions you have with your health care provider. ° ° °

## 2017-12-19 NOTE — Discharge Summary (Addendum)
Physician Obstetric Discharge Summary  Patient ID: Sheila [name] MRN: [id number] DOB/AGE: 26/01/1991 26 y.o.   Prenatal [facility]: [facility]  Date of Admission: 12/16/2017  Date of Discharge: 12/19/2017  Admitting Diagnosis: Pregnancy at 233w5d. PPROM  Secondary Diagnosis: History of cesarean section. Uterine didelphys. ?Pre-eclampsia without severe features, History of HSV, history of preterm delivery.   Discharge Diagnosis: Delivered. Postpartum hemorrhage.  Date of Delivery: 12/16/2017  Delivered by: Christin BachJohn Ferguson, MD  Mode of Delivery: Repeat low transverse cesarean section with repair of avulsion of inferior portion of uterus and placement of intrauterine balloon.    Anesthesia: spinal  Brief Hospital Course  She received two of packed red blood cells intraoperative. She had an uncomplicated PP course. She was on unasyn until the intrauterine balloons were removed on POD#1. On the day of discharge was meeting all post op goals including flatus.   Labs: CBC Latest Ref Rng & Units 12/19/2017 12/17/2017 12/16/2017  WBC 4.0 - 10.5 K/uL 15.4(H) 22.1(H) 25.7(H)  Hemoglobin 12.0 - 15.0 g/dL 10.3(L) 10.2(L) 12.1  Hematocrit 36.0 - 46.0 % 29.5(L) 28.0(L) 34.3(L)  Platelets 150 - 400 K/uL 151 88(L) 92(L)   CMP Latest Ref Rng & Units 12/19/2017 12/08/2017 07/30/2016  Glucose 70 - 99 mg/dL 73 94 95  BUN 6 - 20 mg/dL 17 9 7   Creatinine 0.44 - 1.00 mg/dL 9.140.67 7.820.76 9.560.64  Sodium 135 - 145 mmol/L 136 134(L) 138  Potassium 3.5 - 5.1 mmol/L 4.0 3.5 3.5  Chloride 98 - 111 mmol/L 104 104 104  CO2 22 - 32 mmol/L 22 18(L) 27  Calcium 8.9 - 10.3 mg/dL 8.0(L) 8.3(L) 9.2  Total Protein 6.5 - 8.1 g/dL 2.1(H5.4(L) 6.9 7.3  Total Bilirubin 0.3 - 1.2 mg/dL 0.6 0.7 0.8  Alkaline Phos 38 - 126 U/L 77 159(H) 35(L)  AST 15 - 41 U/L 26 22 16   ALT 0 - 44 U/L 12 13 14    A POS   Physical exam:   Current Vital Signs 24h Vital Sign Ranges  T 98.1 F (36.7 C) Temp  Avg: 98.1 F (36.7 C)  Min: 97.4 F (36.3  C)  Max: 98.6 F (37 C)  BP 119/78 BP  Min: 116/76  Max: 128/85  HR 90 Pulse  Avg: 88.7  Min: 84  Max: 97  RR 18 Resp  Avg: 17  Min: 16  Max: 18  SaO2 99 % Room Air SpO2  Avg: 99.3 %  Min: 98 %  Max: 100 %       24 Hour I/O Current Shift I/O  Time Ins Outs No intake/output data recorded. No intake/output data recorded.   NAD Perineum: deferred Abdomen: firm fundus below the umbilicus, NTTP, non distended, +bowel sounds. Dressing c/d/i  RRR no MRGs CTAB Ext: no c/c/e  Discharge Instructions: Per After Visit Summary. Activity: Advance as tolerated. Pelvic rest for 6 weeks.  Also refer to After Visit Summary Diet: Regular Postpartum contraception: patient unsure Discharge Medications:  Allergies as of 12/19/2017   No Known Allergies     Medication List    TAKE these medications   ibuprofen 600 MG tablet Commonly known as:  ADVIL,MOTRIN Take 1 tablet (600 mg total) by mouth every 6 (six) hours as needed.   oxyCODONE-acetaminophen 5-325 MG tablet Commonly known as:  PERCOCET/ROXICET Take 1-2 tablets by mouth every 6 (six) hours as needed for moderate pain or severe pain.   prenatal multivitamin Tabs tablet Take 1 tablet by mouth daily at 12 noon.  simethicone 80 MG chewable tablet Commonly known as:  MYLICON Chew 1 tablet (80 mg total) by mouth as needed for flatulence.       Discharged Condition: Stable Discharged to: Home Outpatient follow up:  Follow-up Information    [facility]. Schedule an appointment as soon as possible for a visit in 1 week(s).   Why:  incision check Contact information: [address] [place] [address] [phone]          Newborn Data: APGAR (1 MIN): 10   APGAR (5 MINS): 10   APGAR (10 MINS):   Baby Weight: 2140gm Baby Feeding: formula Disposition:home with mother   Cornelia Copa MD Attending Center for [employer] [facility])

## 2017-12-19 NOTE — Lactation Note (Signed)
This note was copied from a baby's chart. Lactation Consultation Note  Patient Name: Sheila [name] RUEAV'WToday's Date: 12/19/2017 Reason for consult: Follow-up assessment  Infant was finger-fed an additional time. She starts off vigorously, but seems to quickly lose energy. The finger-feeding was ended at 8 minutes because she was no longer effectively sucking (only non-nutritive sucking was noted).   Lauren, PNP given update.   Lurline HareRichey, Sid Greener [facility] 12/19/2017, 3:24 PM

## 2017-12-19 NOTE — Clinical Social Work Maternal (Signed)
CLINICAL SOCIAL WORK MATERNAL/CHILD NOTE  Patient Details  Name: Sheila [name] MRN: [id number] Date of Birth: [birth year]  Date:  12/19/2017  Clinical Social Worker Initiating Note:  Sheila [name], MSW, LCSW-A Date/Time: Initiated:  12/19/17/1439     Child's Name:  Sheila [name]   Biological Parents:  Mother, Father   Need for Interpreter:  None   Reason for Referral:  Current Substance Use/Substance Use During Pregnancy , Other (Comment)(Staff concerns)   Address:  [address] [place] [address]    Phone number:  [phone] (home)     Additional phone number: None  Household Members/Support Persons (HM/SP):   Household Member/Support Person 1   HM/SP Name Relationship DOB or Age  HM/SP -1 Sheila [name] [name]    HM/SP -2        HM/SP -3        HM/SP -4        HM/SP -5        HM/SP -6        HM/SP -7        HM/SP -8          Natural Supports (not living in the home):  Friends, Extended Family, Immediate Family   Professional Supports: None   Employment: Unemployed   Type of Work:  None   Education:  High school graduate   Homebound arranged:    Museum/gallery curator Resources:  Medicaid   Other Resources:  [employer], Physicist, medical    Cultural/Religious Considerations Which May Impact Care:  Christian  Strengths:  Home prepared for child , Ability to meet basic needs , Pediatrician chosen   Psychotropic Medications:   None      Pediatrician:    [employer]  Pediatrician List:   [place]      Pediatrician Fax Number:    Risk Factors/Current Problems:      Cognitive State:  Alert , Able to Concentrate    Mood/Affect:  Calm , Comfortable    CSW Assessment: CSW met with [name], [name], and newborn at bedside to discuss current needs and concerns identified by nursing staff. [name] is Sheila [name] who is visiting from [place]. CSW asked  permission to discuss concerns with [name] present, [name] agreed. Shortly after CSW arrived, [name] left to go downstairs to meet with another family member. [name] stated that she lives with her [name] Sheila [name] and their almost 26 year old [place] in [place]. [name] reports she is unemployed, receives Librarian, academic. CSW educated [name] on [facility] and how to obtain certification for herself and newborn. [name]'s chart noted for substance use history, [name] denied any use after finding out she was pregnant. [name] was educated by CSW on hospital drug screening policies and that infant's UDS was negative. CSW informed [name] that cord results would be followed and a report would be made if warranted. [name]'s newborn is named Sheila [name]. [name] reports that [name] will receive pediatric care at [facility]. [name] states [name] will sleep in a bassinet or crib at home. SIDS reduction techniques were reviewed. [name] reports having a new car seat for [name]. CSW inquired with [name] about her confidence to take care of two small children, [name] stated that she feels equipped and will rely on her support system when needed. [name] and [name] concerned about [name]'s lack of desire to feed, so call  was placed to lactation for additional support. CSW encouraged [name] to reach out for assistance if needs arise prior or after discharge, [name] agreeable. CSW did not identify any barriers to discharge or any concerns for [name]'s ability to care for [name].   CSW to continue following cord and will make report if warranted. No barriers to discharge at this time.  CSW Plan/Description:  No Further Intervention Required/No Barriers to Discharge, Sudden Infant Death Syndrome (SIDS) Education, [place], CSW Will Continue to Monitor Umbilical Cord Tissue Drug Screen Results and Make Report if Sheila [name] 12/19/2017, 2:40 PM

## 2017-12-19 NOTE — Lactation Note (Addendum)
This note was copied from a baby's chart. Lactation Consultation Note  Patient Name: Sheila [name] ZOXWR'UToday's Date: 12/19/2017 Reason for consult: Follow-up assessment  The purple NFant nipple was tried with bottle feeding. Initially, infant seemed to do OK, but then seemed to tire. I placed her in side-lying, inclined, & added jaw support. However, after 7 min, she was no longer sucking and had only drunk 5 mL.  Burnis KingfisherL. Rafeek, PNP was notified & I then attempted finger-feeding with a 5 Fr & syringe taped to Mom's clean finger. Infant was no longer interested after 11 min of finger feeding; she had drunk 15 mL.  [name] feels that infant seems to have lost interest in feeding since yesterday.  Lurline HareRichey, Dontavia Brand [facility] 12/19/2017, 1:41 PM

## 2017-12-19 NOTE — Lactation Note (Addendum)
This note was copied from a baby's chart. Lactation Consultation Note  Patient Name: Sheila [name] BMWUX'LToday's Date: 12/19/2017   While the infant was [facility] [address] [facility], she  took 2 bottle feeds well using a different nipple (standard Similac w/the clear ring). Extra Similac nipples were provided. Mom & [name] understand that infant needs to bottle feed an adequate amount in 25 min or less, using side-lying inclined with chin support. It was mentioned to aim for 45 mL, if possible, but to respect infant's cues.   Mom has Dr. Theora GianottiBrown's Level 1 & preemie nipples at home. Mom has been engaged with care; she is appropriate, tearful at times out of concern for infant. Lurline HareRichey, Altus Zaino [facility] 12/19/2017, 8:35 PM

## 2017-12-19 NOTE — Lactation Note (Addendum)
This note was copied from a baby's chart.  Lactation Consultation Note  Patient Name: Sheila [name] [id number]'XToday's Date: 12/19/2017   When I entered room, [name] was trying to undress infant to wake her to feed. Apparently, infant had shown signs of hunger, but only took 3mL via bottle in 15-20 minutes. [name] reports that it can sometimes take 45 min for infant to finish a bottle feeding. We will attempt a different bottle feeding nipple at next feeding. Grandparents to give me a call to observe/assist next bottle feeding.   Mom felt uncomfortable with the size 24 flanges when pumping. I moved her to size 27 flanges & she felt more comfortable.  Mom does not seem to be a good historian; her parents answer her questions for her. I spoke w/Carol, SW about my impression.   Lurline HareRichey, Evalyn [facility]ultis [facility] 12/19/2017, 11:37 AM

## 2017-12-20 ENCOUNTER — Telehealth: Payer: Self-pay | Admitting: *Deleted

## 2017-12-20 ENCOUNTER — Encounter: Payer: Self-pay | Admitting: *Deleted

## 2017-12-20 ENCOUNTER — Ambulatory Visit: Payer: Self-pay

## 2017-12-20 NOTE — Telephone Encounter (Signed)
Attempted to contact patient to schedule appointment. Voicemail is full. 12/20/17 JM

## 2017-12-20 NOTE — Lactation Note (Signed)
This note was copied from a baby's chart. Lactation Consultation Note  Patient Name: Girl Hazell Siwik [id number] Date: 12/20/2017   Infant has gained 2.8 oz (between her weight on the morning of the 21st & the 22nd). Mom reports that infant is drinking better from the bottle. I fed [name] while Mom pumped & she drank 44 mL (30 mL EBM + 14 mL formula) in 12 minutes.   Mom is engorged. She had not pumped in 8.5 hours. With the pumping (referred to above), she was able to express 25 mL. She was placed in shells and encouraged to wear them between pumping sessions to promote leaking. Engorgement relief instructions provided & discussed. Mom knows to pump q2.5-3hrs (with no more than a max of 4 hrs between pumping sessions).   Mom was shown how to assemble & use hand pump (double & single-mode) that was included in pump kit. Mom reports that she has a Medela DEBP at home. Discussion of returning for an outpatient LC appt to do pre- & post-weights once infant is older & ready to use feeding at the breast as her primary mode of nutrition.  Matthias Hughs [facility] 12/20/2017, 1:10 PM

## 2017-12-20 NOTE — Telephone Encounter (Signed)
Left message with mom for pt to call us back.  12-20-17  AS

## 2017-12-21 ENCOUNTER — Encounter: Payer: Medicaid Other | Admitting: Women's Health

## 2017-12-22 ENCOUNTER — Telehealth: Payer: Self-pay | Admitting: Obstetrics and Gynecology

## 2017-12-22 NOTE — Telephone Encounter (Signed)
Pt has appt in am. 

## 2017-12-23 ENCOUNTER — Ambulatory Visit (INDEPENDENT_AMBULATORY_CARE_PROVIDER_SITE_OTHER): Payer: Medicaid Other | Admitting: Women's Health

## 2017-12-23 ENCOUNTER — Encounter: Payer: Self-pay | Admitting: Women's Health

## 2017-12-23 VITALS — BP 120/80 | HR 106 | Ht 63.0 in | Wt 124.0 lb

## 2017-12-23 DIAGNOSIS — Z9889 Other specified postprocedural states: Secondary | ICD-10-CM | POA: Diagnosis not present

## 2017-12-23 DIAGNOSIS — Z4889 Encounter for other specified surgical aftercare: Secondary | ICD-10-CM

## 2017-12-23 NOTE — Patient Instructions (Signed)
NO SEX UNTIL AFTER YOU GET YOUR BIRTH CONTROL   Etonogestrel implant What is this medicine? ETONOGESTREL (et oh noe JES trel) is a contraceptive (birth control) device. It is used to prevent pregnancy. It can be used for up to 3 years. This medicine may be used for other purposes; ask your health care provider or pharmacist if you have questions. COMMON BRAND NAME(S): Implanon, Nexplanon What should I tell my health care provider before I take this medicine? They need to know if you have any of these conditions: -abnormal vaginal bleeding -blood vessel disease or blood clots -cancer of the breast, cervix, or liver -depression -diabetes -gallbladder disease -headaches -heart disease or recent heart attack -high blood pressure -high cholesterol -kidney disease -liver disease -renal disease -seizures -tobacco smoker -an unusual or allergic reaction to etonogestrel, other hormones, anesthetics or antiseptics, medicines, foods, dyes, or preservatives -pregnant or trying to get pregnant -breast-feeding How should I use this medicine? This device is inserted just under the skin on the inner side of your upper arm by a health care professional. Talk to your pediatrician regarding the use of this medicine in children. Special care may be needed. Overdosage: If you think you have taken too much of this medicine contact a poison control center or emergency room at once. NOTE: This medicine is only for you. Do not share this medicine with others. What if I miss a dose? This does not apply. What may interact with this medicine? Do not take this medicine with any of the following medications: -amprenavir -bosentan -fosamprenavir This medicine may also interact with the following medications: -barbiturate medicines for inducing sleep or treating seizures -certain medicines for fungal infections like ketoconazole and itraconazole -grapefruit juice -griseofulvin -medicines to treat  seizures like carbamazepine, felbamate, oxcarbazepine, phenytoin, topiramate -modafinil -phenylbutazone -rifampin -rufinamide -some medicines to treat HIV infection like atazanavir, indinavir, lopinavir, nelfinavir, tipranavir, ritonavir -St. John's wort This list may not describe all possible interactions. Give your health care provider a list of all the medicines, herbs, non-prescription drugs, or dietary supplements you use. Also tell them if you smoke, drink alcohol, or use illegal drugs. Some items may interact with your medicine. What should I watch for while using this medicine? This product does not protect you against HIV infection (AIDS) or other sexually transmitted diseases. You should be able to feel the implant by pressing your fingertips over the skin where it was inserted. Contact your doctor if you cannot feel the implant, and use a non-hormonal birth control method (such as condoms) until your doctor confirms that the implant is in place. If you feel that the implant may have broken or become bent while in your arm, contact your healthcare provider. What side effects may I notice from receiving this medicine? Side effects that you should report to your doctor or health care professional as soon as possible: -allergic reactions like skin rash, itching or hives, swelling of the face, lips, or tongue -breast lumps -changes in emotions or moods -depressed mood -heavy or prolonged menstrual bleeding -pain, irritation, swelling, or bruising at the insertion site -scar at site of insertion -signs of infection at the insertion site such as fever, and skin redness, pain or discharge -signs of pregnancy -signs and symptoms of a blood clot such as breathing problems; changes in vision; chest pain; severe, sudden headache; pain, swelling, warmth in the leg; trouble speaking; sudden numbness or weakness of the face, arm or leg -signs and symptoms of liver injury like dark yellow   or brown  urine; general ill feeling or flu-like symptoms; light-colored stools; loss of appetite; nausea; right upper belly pain; unusually weak or tired; yellowing of the eyes or skin -unusual vaginal bleeding, discharge -signs and symptoms of a stroke like changes in vision; confusion; trouble speaking or understanding; severe headaches; sudden numbness or weakness of the face, arm or leg; trouble walking; dizziness; loss of balance or coordination Side effects that usually do not require medical attention (report to your doctor or health care professional if they continue or are bothersome): -acne -back pain -breast pain -changes in weight -dizziness -general ill feeling or flu-like symptoms -headache -irregular menstrual bleeding -nausea -sore throat -vaginal irritation or inflammation This list may not describe all possible side effects. Call your doctor for medical advice about side effects. You may report side effects to FDA at 1-800-FDA-1088. Where should I keep my medicine? This drug is given in a hospital or clinic and will not be stored at home. NOTE: This sheet is a summary. It may not cover all possible information. If you have questions about this medicine, talk to your doctor, pharmacist, or health care provider.  2018 Elsevier/Gold Standard (2015-12-05 11:19:22)  

## 2017-12-23 NOTE — Progress Notes (Signed)
   GYN VISIT Patient name: Sheila [name] MRN [id number]  Date of birth: 04/26/1991 Chief Complaint:   Routine Post Op  History of Present Illness:   Sheila NippleChynna L Pasquarelli is a 27 y.o. [phone] Caucasian female 1wk s/p RLTCS @ 35.5wks after PPROM, being seen today for incision check.  C/S was complicated by avulsion of inferior portion of midline septum and intraoperative hemorrhage requiring 4u PRBC and 1uFFP w/ placement of 2 bakri balloons d/t uterine didelphys. Breastfeeding. Incision pain well controlled w/ ibuprofen. Denies dizziness, lightheadedness, sob. Lochia normal. No problems. Wants nexplanon.   Patient's last menstrual period was 03/30/2017. Review of Systems:   Pertinent items are noted in HPI Denies fever/chills, dizziness, headaches, visual disturbances, fatigue, shortness of breath, chest pain, abdominal pain, vomiting, abnormal vaginal discharge/itching/odor/irritation, problems with periods, bowel movements, urination, or intercourse unless otherwise stated above.  Pertinent History Reviewed:  Reviewed past medical,surgical, social, obstetrical and family history.  Reviewed problem list, medications and allergies. Physical Assessment:   Vitals:   12/23/17 1033  BP: 120/80  Pulse: (!) 106  Weight: 124 lb (56.2 kg)  Height: 5\' 3"  (1.6 m)  Body mass index is 21.97 kg/m.       Physical Examination:   General appearance: alert, well appearing, and in no distress  Mental status: alert, oriented to person, place, and time  Skin: warm & dry   Cardiovascular: normal heart rate noted  Respiratory: normal respiratory effort, no distress  Abdomen: soft, non-tender, c/s incision healing well, steri-strips removed, +bruising and edema above incision. JVF also assessed   Pelvic: examination not indicated  Extremities: no edema   No results found for this or any previous visit (from the past 24 hour(s)).  Assessment & Plan:  1) 1wk s/p RLTCS @ 35.5wks d/t PPROM w/ avulsion of  septum/PPH> breastfeeding, doing well, incision healing well  2) Wants Nexplanon> order today, abstinence until insertion  Meds: No orders of the defined types were placed in this encounter.   No orders of the defined types were placed in this encounter.   Return in about 1 month (around 01/20/2018) for postpartum visit, then next day for nexplanon (order today please).  Cheral MarkerKimberly R Fabrice Dyal CNM, [facility] 12/23/2017 11:02 AM

## 2017-12-24 ENCOUNTER — Encounter (HOSPITAL_COMMUNITY)
Admission: RE | Admit: 2017-12-24 | Discharge: 2017-12-24 | Disposition: A | Discharge status: Home / Self Care | Payer: Self-pay | Source: Ambulatory Visit

## 2017-12-25 ENCOUNTER — Inpatient Hospital Stay (HOSPITAL_COMMUNITY): Payer: Medicaid Other

## 2017-12-26 ENCOUNTER — Inpatient Hospital Stay (HOSPITAL_COMMUNITY): Admit: 2017-12-26 | Payer: Self-pay | Admitting: Obstetrics & Gynecology

## 2017-12-28 ENCOUNTER — Encounter: Payer: Self-pay | Admitting: Obstetrics & Gynecology

## 2018-01-24 ENCOUNTER — Encounter: Payer: Self-pay | Admitting: Women's Health

## 2018-01-24 ENCOUNTER — Ambulatory Visit (INDEPENDENT_AMBULATORY_CARE_PROVIDER_SITE_OTHER): Payer: Medicaid Other | Admitting: Women's Health

## 2018-01-24 NOTE — Progress Notes (Signed)
   POSTPARTUM VISIT Patient name: Sheila [name] Olesen MRN [id number]  Date of birth: 01/31/1991 Chief Complaint:   Postpartum Care  History of Present Illness:   Sheila [name] Salone is a 27 y.o. [phone] Caucasian female being seen today for a postpartum visit. She is 5 weeks postpartum following a repeat cesarean section, low transverse incision at 35.5 gestational weeks after PPROM, c/s complicated by avulsion of inferior portion of midline septum, intraoperative hemorrhage requiring PRBC x 4u and Bakri balloon. Anesthesia: spinal. I have fully reviewed the prenatal and intrapartum course. 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 Postpartum course has been uncomplicated. Bleeding no bleeding. Bowel function is normal. Bladder function is normal.  Patient is not sexually active. Last sexual activity: prior to birth of baby.  Contraception method is Nexplanon.  [place] Postpartum Depression Screening: negative. Score 9. Doesn't feel depressed, just overwhelmed/stressed. Has support at home. Denies SI/HI/II. Appetite and sleep are good, still finds joy in things she used to.    Last pap 06/28/17.  Results were normal .  No LMP recorded.  Baby's course has been uncomplicated. Baby is feeding by bottle.  Review of Systems:   Pertinent items are noted in HPI Denies Abnormal vaginal discharge w/ itching/odor/irritation, headaches, visual changes, shortness of breath, chest pain, abdominal pain, severe nausea/vomiting, or problems with urination or bowel movements. Pertinent History Reviewed:  Reviewed past medical,surgical, obstetrical and family history.  Reviewed problem list, medications and allergies. OB History  Gravida Para Term Preterm AB Living  3 2   2 1 2   SAB TAB Ectopic Multiple Live Births  1     0 2    # Outcome Date GA Lbr Len/2nd Weight Sex Delivery Anes PTL Lv  3 Preterm 12/16/17 1834w5d  4 lb 11.5 oz (2.14 kg) F CS-LTranv Spinal  LIV  2 SAB 03/2016          1 Preterm 12/21/13 5530w2d   6 lb 8.1 oz (2.951 kg) M CS-LTranv EPI N LIV     Complications: Failure to Progress in First Stage, Uterus didelphys   Physical Assessment:   Vitals:   01/24/18 1452  BP: 121/75  Pulse: 94  Weight: 117 lb (53.1 kg)  Height: 5\' 2"  (1.575 m)  Body mass index is 21.4 kg/m.       Physical Examination:   General appearance: alert, well appearing, and in no distress  Mental status: alert, oriented to person, place, and time  Skin: warm & dry   Cardiovascular: normal heart rate noted   Respiratory: normal respiratory effort, no distress   Breasts: deferred, no complaints   Abdomen: soft, non-tender, c/s incision well-healed  Pelvic: examination not indicated  Rectal: no hemorrhoids  Extremities: no edema       No results found for this or any previous visit (from the past 24 hour(s)).  Assessment & Plan:  1) Postpartum exam 2) 5 wks s/p RCS w/ hemorrhage requiring blood transfusion/Bakri balloon 3) Bottlefeeding 4) Depression screening 5) Contraception counseling, pt prefers Nexplanon, scheduled for tomorrow  Meds: No orders of the defined types were placed in this encounter.   Follow-up: Return for as scheduled tomorrow for nexplanon.   No orders of the defined types were placed in this encounter.   Cheral MarkerKimberly R Daemian Gahm CNM, [facility] 01/24/2018 4:58 PM

## 2018-01-25 ENCOUNTER — Ambulatory Visit (INDEPENDENT_AMBULATORY_CARE_PROVIDER_SITE_OTHER): Payer: Medicaid Other | Admitting: Advanced Practice Midwife

## 2018-01-25 ENCOUNTER — Encounter: Payer: Self-pay | Admitting: Advanced Practice Midwife

## 2018-01-25 VITALS — BP 105/69 | HR 86 | Ht 62.0 in | Wt 117.0 lb

## 2018-01-25 DIAGNOSIS — Z30017 Encounter for initial prescription of implantable subdermal contraceptive: Secondary | ICD-10-CM

## 2018-01-25 DIAGNOSIS — Z3049 Encounter for surveillance of other contraceptives: Secondary | ICD-10-CM

## 2018-01-25 DIAGNOSIS — Z3202 Encounter for pregnancy test, result negative: Secondary | ICD-10-CM | POA: Diagnosis not present

## 2018-01-25 LAB — POCT URINE PREGNANCY: Preg Test, Ur: NEGATIVE

## 2018-01-25 MED ORDER — ETONOGESTREL 68 MG ~~LOC~~ IMPL
68.0000 mg | DRUG_IMPLANT | Freq: Once | SUBCUTANEOUS | Status: AC
  Administered 2018-01-25: 68 mg via SUBCUTANEOUS

## 2018-01-25 NOTE — Progress Notes (Signed)
  HPI:  Sheila [name] is a 27 y.o. year old Caucasian female here for Vonzella Nippleexplanon insertion.  her pregnancy test today was negative.  Risks/benefits/side effects of Nexplanon have been discussed and her questions have been answered.  Specifically, a failure rate of 06/998 has been reported, with an increased failure rate if pt takes St. John's Wort and/or antiseizure medicaitons.  Sheila [name] is aware of the common side effect of irregular bleeding, which the incidence of decreases over time.   Past Medical History: Past Medical History:  Diagnosis Date  . Anxiety   . Hematuria 11/07/2014  . 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 Nosebleed 09/06/2013  . Pain with urination 11/07/2014  . Round ligament pain 09/06/2013    Past Surgical History: Past Surgical History:  Procedure Laterality Date  . Bilateral breast implants  12/2011    . CESAREAN SECTION N/A 12/21/2013   Procedure: CESAREAN SECTION;  Surgeon: Lazaro ArmsLuther H Eure, MD;  Location: [facility];  Service: Obstetrics;  Laterality: N/A;  . CESAREAN SECTION N/A 12/16/2017   Procedure: REPEAT CESAREAN SECTION;  Surgeon: Lazaro ArmsEure, Luther H, MD;  [facility];  Service: Obstetrics;  Laterality: N/A;  . TONSILLECTOMY      Family History: Family History  Problem Relation Age of Onset  . Cancer Maternal Grandmother   . Cancer Maternal Grandfather     Social History: Social History   Tobacco Use  . Smoking status: Former Smoker    Types: Cigarettes  . Smokeless tobacco: Never Used  Substance Use Topics  . Alcohol use: No    Alcohol/week: 2.0 standard drinks    Types: 2 Shots of liquor per week  . Drug use: No    Allergies: No Known Allergies    Her left arm, approximatly 4 inches proximal from the elbow, was cleansed with alcohol and anesthetized with 2cc of 2% Lidocaine.  The area was cleansed again and the Nexplanon was inserted without difficulty.  A pressure bandage was applied.  Pt was instructed to  remove pressure bandage in a few hours, and keep insertion site covered with a bandaid for 3 days.  Back up contraception was recommended for 2 weeks.  Follow-up scheduled PRN problems  Jacklyn ShellFrances Cresenzo-Dishmon 01/25/2018 3:49 PM

## 2018-12-01 ENCOUNTER — Telehealth: Payer: Self-pay | Admitting: Advanced Practice Midwife

## 2018-12-01 ENCOUNTER — Other Ambulatory Visit: Payer: Self-pay | Admitting: Advanced Practice Midwife

## 2018-12-01 MED ORDER — MEGESTROL ACETATE 40 MG PO TABS: ORAL_TABLET | ORAL | 3 refills | Status: DC

## 2018-12-01 NOTE — Progress Notes (Signed)
megac efo rDUB/nexplanon

## 2018-12-01 NOTE — Telephone Encounter (Signed)
Patient called stating that she would lie for Manus Gunning to call her in a medication to help her stop her bleeding. Pt states that she got the nexplanon in and she has not stop bleeding. Please contact pt. Pt states she uses [facility] in [place]

## 2018-12-19 ENCOUNTER — Telehealth: Payer: Self-pay | Admitting: Adult Health

## 2018-12-19 NOTE — Telephone Encounter (Signed)
Called patient back and informed her that Manus Gunning had sent in Megace at the beginning of the month. She was not aware. Explained to her how to take it. Patient agreeable. She will call pharmacy to get them to fill it.

## 2018-12-19 NOTE — Telephone Encounter (Signed)
Patient called, stated that he has an implanon, since August 2019.  She stated that she has been bleeding for a month.  Please advise.  [place]

## 2019-01-12 ENCOUNTER — Ambulatory Visit (INDEPENDENT_AMBULATORY_CARE_PROVIDER_SITE_OTHER): Payer: Medicaid Other | Admitting: Otolaryngology

## 2019-01-30 ENCOUNTER — Ambulatory Visit (INDEPENDENT_AMBULATORY_CARE_PROVIDER_SITE_OTHER): Payer: Medicaid Other | Admitting: Otolaryngology

## 2019-01-30 DIAGNOSIS — H6121 Impacted cerumen, right ear: Secondary | ICD-10-CM

## 2019-01-30 DIAGNOSIS — H9011 Conductive hearing loss, unilateral, right ear, with unrestricted hearing on the contralateral side: Secondary | ICD-10-CM

## 2019-02-10 DIAGNOSIS — H6121 Impacted cerumen, right ear: Secondary | ICD-10-CM | POA: Diagnosis not present

## 2019-02-10 DIAGNOSIS — H60331 Swimmer's ear, right ear: Secondary | ICD-10-CM | POA: Diagnosis not present

## 2019-02-10 DIAGNOSIS — H9011 Conductive hearing loss, unilateral, right ear, with unrestricted hearing on the contralateral side: Secondary | ICD-10-CM | POA: Diagnosis not present

## 2019-03-13 DIAGNOSIS — H5203 Hypermetropia, bilateral: Secondary | ICD-10-CM | POA: Diagnosis not present

## 2019-03-13 DIAGNOSIS — H5213 Myopia, bilateral: Secondary | ICD-10-CM | POA: Diagnosis not present

## 2019-03-13 DIAGNOSIS — H52223 Regular astigmatism, bilateral: Secondary | ICD-10-CM | POA: Diagnosis not present

## 2019-04-05 DIAGNOSIS — H5203 Hypermetropia, bilateral: Secondary | ICD-10-CM | POA: Diagnosis not present

## 2019-04-05 DIAGNOSIS — H52223 Regular astigmatism, bilateral: Secondary | ICD-10-CM | POA: Diagnosis not present

## 2019-04-12 ENCOUNTER — Encounter: Payer: Self-pay | Admitting: Adult Health

## 2019-04-12 ENCOUNTER — Ambulatory Visit (INDEPENDENT_AMBULATORY_CARE_PROVIDER_SITE_OTHER): Payer: Medicaid Other | Admitting: Adult Health

## 2019-04-12 ENCOUNTER — Other Ambulatory Visit: Payer: Self-pay

## 2019-04-12 VITALS — BP 92/61 | HR 108 | Ht 63.0 in | Wt 107.0 lb

## 2019-04-12 DIAGNOSIS — N898 Other specified noninflammatory disorders of vagina: Secondary | ICD-10-CM

## 2019-04-12 NOTE — Progress Notes (Addendum)
  Subjective:     Patient ID: Sheila [name], female   DOB: 09/19/1990, 28 y.o.   MRN: [id number]  HPI Sheila [name] is a 28 year old white female,single, [id number],KPT nexplanon, in complaining of vaginal dryness since delivery last year seems worse with sex. PCP [facility].   Review of Systems Vaginal dryness since delivery last year, seems worse with sex Denies any burning or itching  Reviewed past medical,surgical, social and family history. Reviewed medications and allergies.     Objective:   Physical Exam BP 92/61 (BP Location: Left Arm, Patient Position: Sitting, Cuff Size: Normal)   Pulse (!) 108   Ht 5\' 3"  (1.6 m)   Wt 107 lb (48.5 kg)   LMP 04/05/2019 (Approximate)   BMI 18.95 kg/m   Skin warm and dry. Lungs: clear to ausculation bilaterally. Cardiovascular: regular rate and rhythm.   Pelvic: external genitalia is normal in appearance no lesions, vagina: pink with good moisture and rugae,urethra has no lesions or masses noted, cervix:smooth and bulbous, uterus: normal size, shape and contour, non tender, no masses felt, adnexa: no masses or tenderness noted. Bladder is non tender and no masses felt.  Fall risk is low PHQ 2 score is 0. Exam by Weyman Croon, FNP student under my supervision. Face time 15 minutes with 50% counseling on lubrication options.  Assessment:     1. Vaginal dryness       Plan:     Try Luvena for daily vaginal moisture, use every 72 hours or so Can try astroglide, KY jelly olive oil and coconut oil with sex, and increased foreplay, use on partner too If does not get better could try vaginal estrogen, just call  Follow up prn

## 2019-06-08 ENCOUNTER — Encounter: Payer: Self-pay | Admitting: Advanced Practice Midwife

## 2019-06-08 ENCOUNTER — Other Ambulatory Visit: Payer: Self-pay

## 2019-06-08 ENCOUNTER — Ambulatory Visit (INDEPENDENT_AMBULATORY_CARE_PROVIDER_SITE_OTHER): Payer: Medicaid Other | Admitting: Advanced Practice Midwife

## 2019-06-08 VITALS — BP 109/75 | HR 114 | Ht 63.0 in | Wt 108.0 lb

## 2019-06-08 DIAGNOSIS — Z3046 Encounter for surveillance of implantable subdermal contraceptive: Secondary | ICD-10-CM | POA: Diagnosis not present

## 2019-06-08 MED ORDER — NORETHIN-ETH ESTRAD-FE BIPHAS 1 MG-10 MCG / 10 MCG PO TABS: 1.0000 | ORAL_TABLET | Freq: Every day | ORAL | 11 refills | Status: DC

## 2019-06-08 NOTE — Progress Notes (Signed)
HPI:  [employer] 29 y.o. here for Nexplanon removal.  Her future plans for birth control are COCs  Bleeds too much, even w/Megace.  Past Medical History: Past Medical History:  Diagnosis Date  . Anxiety   . Hematuria 11/07/2014  . 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 Nosebleed 09/06/2013  . Pain with urination 11/07/2014  . Round ligament pain 09/06/2013    Past Surgical History: Past Surgical History:  Procedure Laterality Date  . Bilateral breast implants  12/2011    . CESAREAN SECTION N/A 12/21/2013   Procedure: CESAREAN SECTION;  Surgeon: Lazaro Arms, MD;  Location: [facility];  Service: Obstetrics;  Laterality: N/A;  . CESAREAN SECTION N/A 12/16/2017   Procedure: REPEAT CESAREAN SECTION;  Surgeon: Lazaro Arms, MD;  Location: [facility]TH[facility];  Service: Obstetrics;  Laterality: N/A;  . TONSILLECTOMY      Family History: Family History  Problem Relation Age of Onset  . Cancer Maternal Grandmother   . Cancer Maternal Grandfather     Social History: Social History   Tobacco Use  . Smoking status: Former Smoker    Types: Cigarettes  . Smokeless tobacco: Never Used  Substance Use Topics  . Alcohol use: No    Alcohol/week: 2.0 standard drinks    Types: 2 Shots of liquor per week  . Drug use: No    Allergies: No Known Allergies  Meds: (Not in a hospital admission)     Patient given informed consent for removal of her Nexplanon, time out was performed.  Signed copy in the chart.  Appropriate time out taken. Implanon site identified.  Area prepped in usual sterile fashon. One cc of 1% lidocaine was used to anesthetize the area at the distal end of the implant. A small stab incision was made right beside the implant on the distal portion.  The Nexplanon rod was grasped using hemostats and removed without difficulty.  There was less than 3 cc blood loss. There were no complications.  A small amount of antibiotic ointment and steri-strips were applied over the  small incision.  A pressure bandage was applied to reduce any bruising.  The patient tolerated the procedure well and was given post procedure instructions.    Start COCs, use BU for 2 weeks

## 2019-06-08 NOTE — Patient Instructions (Signed)

## 2019-11-03 ENCOUNTER — Other Ambulatory Visit: Payer: Self-pay

## 2019-11-03 ENCOUNTER — Emergency Department (HOSPITAL_COMMUNITY)
Admission: EM | Admit: 2019-11-03 | Discharge: 2019-11-03 | Discharge status: Against Medical Advice | Payer: Medicaid Other | Attending: Emergency Medicine | Admitting: Emergency Medicine

## 2019-11-03 DIAGNOSIS — Z87891 Personal history of nicotine dependence: Secondary | ICD-10-CM | POA: Insufficient documentation

## 2019-11-03 DIAGNOSIS — R4182 Altered mental status, unspecified: Secondary | ICD-10-CM | POA: Diagnosis present

## 2019-11-03 DIAGNOSIS — I469 Cardiac arrest, cause unspecified: Secondary | ICD-10-CM | POA: Diagnosis not present

## 2019-11-03 DIAGNOSIS — T50901A Poisoning by unspecified drugs, medicaments and biological substances, accidental (unintentional), initial encounter: Secondary | ICD-10-CM | POA: Insufficient documentation

## 2019-11-03 DIAGNOSIS — T407X1A Poisoning by cannabis (derivatives), accidental (unintentional), initial encounter: Secondary | ICD-10-CM | POA: Diagnosis not present

## 2019-11-03 LAB — COMPREHENSIVE METABOLIC PANEL
ALT: 30 U/L (ref 0–44)
AST: 46 U/L — ABNORMAL HIGH (ref 15–41)
Albumin: 4.1 g/dL (ref 3.5–5.0)
Alkaline Phosphatase: 58 U/L (ref 38–126)
Anion gap: 14 (ref 5–15)
BUN: 9 mg/dL (ref 6–20)
CO2: 22 mmol/L (ref 22–32)
Calcium: 8.3 mg/dL — ABNORMAL LOW (ref 8.9–10.3)
Chloride: 102 mmol/L (ref 98–111)
Creatinine, Ser: 0.93 mg/dL (ref 0.44–1.00)
GFR calc Af Amer: >60 mL/min (ref >60)
GFR calc non Af Amer: >60 mL/min (ref >60)
Glucose, Bld: 304 mg/dL — ABNORMAL HIGH (ref 70–99)
Potassium: 4.2 mmol/L (ref 3.5–5.1)
Sodium: 138 mmol/L (ref 135–145)
Total Bilirubin: 0.7 mg/dL (ref 0.3–1.2)
Total Protein: 7.2 g/dL (ref 6.5–8.1)

## 2019-11-03 LAB — CBC WITH DIFFERENTIAL/PLATELET
Abs Immature Granulocytes: 0.34 10*3/uL — ABNORMAL HIGH (ref 0.00–0.07)
Basophils Absolute: 0.1 10*3/uL (ref 0.0–0.1)
Basophils Relative: 0 %
Eosinophils Absolute: 0.4 10*3/uL (ref 0.0–0.5)
Eosinophils Relative: 2 %
HCT: 42.9 % (ref 36.0–46.0)
Hemoglobin: 12.8 g/dL (ref 12.0–15.0)
Immature Granulocytes: 2 %
Lymphocytes Relative: 55 %
Lymphs Abs: 12.5 10*3/uL — ABNORMAL HIGH (ref 0.7–4.0)
MCH: 26.6 pg (ref 26.0–34.0)
MCHC: 29.8 g/dL — ABNORMAL LOW (ref 30.0–36.0)
MCV: 89.2 fL (ref 80.0–100.0)
Monocytes Absolute: 1.6 10*3/uL — ABNORMAL HIGH (ref 0.1–1.0)
Monocytes Relative: 7 %
Neutro Abs: 7.5 10*3/uL (ref 1.7–7.7)
Neutrophils Relative %: 34 %
Platelets: 281 10*3/uL (ref 150–400)
RBC: 4.81 MIL/uL (ref 3.87–5.11)
RDW: 12.3 % (ref 11.5–15.5)
WBC: 22.4 10*3/uL — ABNORMAL HIGH (ref 4.0–10.5)
nRBC: 0.1 % (ref 0.0–0.2)

## 2019-11-03 LAB — ETHANOL: Alcohol, Ethyl (B): <10 mg/dL (ref <10)

## 2019-11-03 LAB — I-STAT BETA HCG BLOOD, ED (MC, WL, AP ONLY): I-stat hCG, quantitative: <5 m[IU]/mL (ref <5)

## 2019-11-03 LAB — PATHOLOGIST SMEAR REVIEW

## 2019-11-03 LAB — CBG MONITORING, ED: Glucose-Capillary: 286 mg/dL — ABNORMAL HIGH (ref 70–99)

## 2019-11-03 NOTE — ED Triage Notes (Signed)
Patient arrived by car from unknown location.   Patient arrived unresponsive with unknown cause of unresponsiveness  Chest compressions upon arrival.   .4 and  2 of naloxone given by [name] RN.   20G IV placed in RT AC 0719.

## 2019-11-03 NOTE — ED Notes (Signed)
Patient unable to provide urine sample at this time

## 2019-11-03 NOTE — Progress Notes (Signed)
RT called to bedside for patient unresponsiveness and CPR. Upon arrival, patient being manually ventilated by PA. Intubation equipment set up, but patient started to arouse. Patient currently on NRB with O2 sats of 99% and spontaneously breathing.

## 2019-11-03 NOTE — ED Provider Notes (Signed)
[facility] DEPT Provider Note   CSN: [id number] Arrival date & time: 11/03/19  0102     History Chief Complaint  Patient presents with  . Loss of Consciousness    Sheila [name] is a 29 y.o. female.  HPI Level 5 caveat due to altered mental status. Patient came in unresponsive without a pulse.  Dropped off by car with boyfriend.  States that they had smoked some marijuana.  States that she had done the Xanax prescribed them on her leg and that she stiffened up and stopped breathing.  No pulse upon arrival.  CPR done.  Sugar reassuring.  Had been given 0.4 of Narcan and some mild improvement.  Then 2mg  given.  Mental status improved.  Pupils initially constricted.    Past Medical History:  Diagnosis Date  . Anxiety   . Hematuria 11/07/2014  . 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 Nosebleed 09/06/2013  . Pain with urination 11/07/2014  . Round ligament pain 09/06/2013    Patient Active Problem List   Diagnosis Date Noted  . Vaginal dryness 04/12/2019  . History of preterm delivery x2 06/28/2017  . History of cervical incompetence in pregnancy 06/28/2017  . Right ovarian cyst 06/10/2017  . Stress and adjustment reaction 01/22/2014  . History of cesarean section x 2 12/21/2013  . 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 Bilateral breast implants  12/2011    . CESAREAN SECTION N/A 12/21/2013   Procedure: CESAREAN SECTION;  Surgeon: Florian Buff, MD;  Location: [facility];  Service: Obstetrics;  Laterality: N/A;  . CESAREAN SECTION N/A 12/16/2017   Procedure: REPEAT CESAREAN SECTION;  Surgeon: Florian Buff, MD;  Location: [facility];  Service: Obstetrics;  Laterality: N/A;  . TONSILLECTOMY       OB History    Gravida  3   Para  2   Term      Preterm  2   AB  1   Living  2     SAB  1   TAB      Ectopic      Multiple  0   Live Births  2             Family History  Problem Relation Age of Onset  . Cancer Maternal Grandmother   . Cancer Maternal Grandfather     Social History   Tobacco Use  . Smoking status: Former Smoker    Types: Cigarettes  . Smokeless tobacco: Never Used  Substance Use Topics  . Alcohol use: No    Alcohol/week: 2.0 standard drinks    Types: 2 Shots of liquor per week  . Drug use: No    Home Medications Prior to Admission medications   Medication Sig Start Date End Date Taking? Authorizing Provider  ALPRAZolam Duanne Moron) 0.5 MG tablet Take 0.5 mg by mouth in the morning and at bedtime.  04/06/19  Yes [provider]  Norethindrone-Ethinyl Estradiol-Fe Biphas (LO LOESTRIN FE) 1 MG-10 MCG / 10 MCG tablet Take 1 tablet by mouth daily. Patient not taking: Reported on 11/03/2019 06/08/19   Christin Fudge, CNM    Allergies    Patient has no known allergies.  Review of Systems   Review of Systems  Unable to perform ROS: Patient unresponsive    Physical Exam Updated Vital Signs BP (!) 88/66   Pulse 99   Resp (!)  21   Ht 5\' 2"  (1.575 m)   Wt 49.9 kg   SpO2 98%   BMI 20.12 kg/m   Physical Exam Vitals reviewed.  HENT:     Head: Atraumatic.  Eyes:     Comments: Pupils constricted.  Cardiovascular:     Comments: Initially no pulse. Pulmonary:     Comments: Initially no spontaneous respirations. Abdominal:     General: There is no distension.  Musculoskeletal:        General: No deformity.  Skin:    Findings: No bruising or erythema.  Neurological:     Comments: Unresponsive.     ED Results / Procedures / Treatments   Labs (all labs ordered are listed, but only abnormal results are displayed) Labs Reviewed  COMPREHENSIVE METABOLIC PANEL - Abnormal; Notable for the following components:      Result Value   Glucose, Bld 304 (*)    Calcium 8.3 (*)    AST 46 (*)    All other components within normal limits  CBC WITH DIFFERENTIAL/PLATELET - Abnormal; Notable  for the following components:   WBC 22.4 (*)    MCHC 29.8 (*)    Lymphs Abs 12.5 (*)    Monocytes Absolute 1.6 (*)    Abs Immature Granulocytes 0.34 (*)    All other components within normal limits  CBG MONITORING, ED - Abnormal; Notable for the following components:   Glucose-Capillary 286 (*)    All other components within normal limits  ETHANOL  RAPID URINE DRUG SCREEN, HOSP PERFORMED  PATHOLOGIST SMEAR REVIEW  I-STAT BETA HCG BLOOD, ED ([facility], [facility], [facility] ONLY)    EKG EKG Interpretation  Date/Time:  Friday November 03 2019 07:29:43 EDT Ventricular Rate:  115 PR Interval:    QRS Duration: 78 QT Interval:  358 QTC Calculation: 496 R Axis:   32 Text Interpretation: Sinus tachycardia Prolonged QT interval Confirmed by [birth year] [phone]) on 11/03/2019 7:46:02 AM   Radiology No results found.  Procedures Procedures (including critical care time)  Medications Ordered in ED Medications - No data to display  ED Course  I have reviewed the triage vital signs and the nursing notes.  Pertinent labs & imaging results that were available during my care of the patient were reviewed by me and considered in my medical decision making (see chart for details).    MDM Rules/Calculators/A&P                      Patient presented in cardiac arrest.  Improved with Narcan.  Patient's mental status improved was able answer some questions.  He will not actively admit to using specific drugs but is hinting that she use something.  You have been in the ER for an hour and a half now.  No longer wants to stay.  I think this is likely an overdose but patient had not given 01/03/2020 urine.  No loss of urine.  QTc is Mildly prolonged. Patient not willing to stay for further work-up.  Overdose may most likely opiates and that she improved significantly with Narcan.  Hopefully she has metabolized enough now that as the Narcan wears off she will not have recurrence of the overdose. However she is not willing to  stay and I don't think she has criteria for IVC.  D/c AMA  Cardiopulmonary Resuscitation (CPR) Procedure Note Directed/Performed by: [place] I personally directed ancillary staff and/or performed CPR in an effort to regain return of spontaneous circulation and to  maintain cardiac, neuro and systemic perfusion.   CRITICAL CARE Performed by: Benjiman Core Total critical care time: 30 minutes Critical care time was exclusive of separately billable procedures and treating other patients. Critical care was necessary to treat or prevent imminent or life-threatening deterioration. Critical care was time spent personally by me on the following activities: development of treatment plan with patient and/or surrogate as well as nursing, discussions with consultants, evaluation of patient's response to treatment, examination of patient, obtaining history from patient or surrogate, ordering and performing treatments and interventions, ordering and review of laboratory studies, ordering and review of radiographic studies, pulse oximetry and re-evaluation of patient's condition.   Final Clinical Impression(s) / ED Diagnoses Final diagnoses:  Accidental drug overdose, initial encounter    Rx / DC Orders ED Discharge Orders    None       Benjiman Core, MD 11/03/19 [phone]

## 2019-11-03 NOTE — ED Notes (Signed)
This patient signed AMA form and this RN signed as witness. Patient verbalizes understanding that she is leaving against medical advice. No further questions at this time. Pt AOx4.

## 2019-11-03 NOTE — Discharge Instructions (Addendum)
You are leaving against our advice.  We cannot say that what ever happened will not return.  Follow-up with

## 2020-02-06 ENCOUNTER — Other Ambulatory Visit: Payer: Self-pay

## 2020-02-06 ENCOUNTER — Encounter (HOSPITAL_COMMUNITY): Payer: Self-pay | Admitting: Emergency Medicine

## 2020-02-06 ENCOUNTER — Emergency Department (HOSPITAL_COMMUNITY)
Admission: EM | Admit: 2020-02-06 | Discharge: 2020-02-06 | Disposition: A | Discharge status: Home / Self Care | Payer: Medicaid Other | Attending: Emergency Medicine | Admitting: Emergency Medicine

## 2020-02-06 DIAGNOSIS — Z5321 Procedure and treatment not carried out due to patient leaving prior to being seen by health care provider: Secondary | ICD-10-CM | POA: Insufficient documentation

## 2020-02-06 DIAGNOSIS — R1030 Lower abdominal pain, unspecified: Secondary | ICD-10-CM | POA: Diagnosis not present

## 2020-02-06 DIAGNOSIS — R32 Unspecified urinary incontinence: Secondary | ICD-10-CM | POA: Diagnosis not present

## 2020-02-06 DIAGNOSIS — R109 Unspecified abdominal pain: Secondary | ICD-10-CM | POA: Diagnosis present

## 2020-02-06 LAB — I-STAT BETA HCG BLOOD, ED (MC, WL, AP ONLY): I-stat hCG, quantitative: 6.9 m[IU]/mL — ABNORMAL HIGH (ref <5)

## 2020-02-06 LAB — CBC
HCT: 36 % (ref 36.0–46.0)
Hemoglobin: 10.9 g/dL — ABNORMAL LOW (ref 12.0–15.0)
MCH: 25.5 pg — ABNORMAL LOW (ref 26.0–34.0)
MCHC: 30.3 g/dL (ref 30.0–36.0)
MCV: 84.3 fL (ref 80.0–100.0)
Platelets: 275 10*3/uL (ref 150–400)
RBC: 4.27 MIL/uL (ref 3.87–5.11)
RDW: 11.8 % (ref 11.5–15.5)
WBC: 18.3 10*3/uL — ABNORMAL HIGH (ref 4.0–10.5)
nRBC: 0 % (ref 0.0–0.2)

## 2020-02-06 LAB — COMPREHENSIVE METABOLIC PANEL
ALT: 14 U/L (ref 0–44)
AST: 14 U/L — ABNORMAL LOW (ref 15–41)
Albumin: 3.5 g/dL (ref 3.5–5.0)
Alkaline Phosphatase: 57 U/L (ref 38–126)
Anion gap: 9 (ref 5–15)
BUN: 7 mg/dL (ref 6–20)
CO2: 25 mmol/L (ref 22–32)
Calcium: 8.9 mg/dL (ref 8.9–10.3)
Chloride: 102 mmol/L (ref 98–111)
Creatinine, Ser: 0.74 mg/dL (ref 0.44–1.00)
GFR calc Af Amer: >60 mL/min (ref >60)
GFR calc non Af Amer: >60 mL/min (ref >60)
Glucose, Bld: 105 mg/dL — ABNORMAL HIGH (ref 70–99)
Potassium: 3.7 mmol/L (ref 3.5–5.1)
Sodium: 136 mmol/L (ref 135–145)
Total Bilirubin: 1 mg/dL (ref 0.3–1.2)
Total Protein: 7 g/dL (ref 6.5–8.1)

## 2020-02-06 LAB — URINALYSIS, ROUTINE W REFLEX MICROSCOPIC
Bilirubin Urine: NEGATIVE
Glucose, UA: NEGATIVE mg/dL
Ketones, ur: NEGATIVE mg/dL
Nitrite: NEGATIVE
Protein, ur: NEGATIVE mg/dL
Specific Gravity, Urine: 1.014 (ref 1.005–1.030)
pH: 6 (ref 5.0–8.0)

## 2020-02-06 LAB — LIPASE, BLOOD: Lipase: 25 U/L (ref 11–51)

## 2020-02-06 NOTE — ED Triage Notes (Signed)
Pt endorses lower abd pain for a few days that is worse when using the a bathroom. Reports one episode of incontinence today.

## 2020-02-06 NOTE — ED Notes (Signed)
Pt notified staff that she was leaving 

## 2020-02-07 ENCOUNTER — Telehealth: Payer: Self-pay | Admitting: *Deleted

## 2020-02-07 NOTE — Telephone Encounter (Signed)
Sheila [name] presented to the ED and left before being seen by the provider on 02/06/20. The patient has been enrolled in an automated general discharge outreach program and 2 attempts to contact the patient will be made to follow up on their ED visit and subsequent needs. The care management team is available to provide assistance to this patient at any time.   Burnard Bunting, RN, BSN, CCRN [facility] [phone]

## 2020-02-09 ENCOUNTER — Ambulatory Visit (INDEPENDENT_AMBULATORY_CARE_PROVIDER_SITE_OTHER): Payer: Medicaid Other | Admitting: Obstetrics & Gynecology

## 2020-02-09 ENCOUNTER — Encounter: Payer: Self-pay | Admitting: Obstetrics & Gynecology

## 2020-02-09 VITALS — BP 122/82 | HR 122 | Ht 63.0 in | Wt 109.0 lb

## 2020-02-09 DIAGNOSIS — Z3202 Encounter for pregnancy test, result negative: Secondary | ICD-10-CM | POA: Diagnosis not present

## 2020-02-09 DIAGNOSIS — N946 Dysmenorrhea, unspecified: Secondary | ICD-10-CM | POA: Diagnosis not present

## 2020-02-09 LAB — POCT URINE PREGNANCY: Preg Test, Ur: NEGATIVE

## 2020-02-09 MED ORDER — KETOROLAC TROMETHAMINE 10 MG PO TABS: 10.0000 mg | ORAL_TABLET | Freq: Three times a day (TID) | ORAL | 0 refills | Status: DC | PRN

## 2020-02-09 MED ORDER — NORETHIN-ETH ESTRAD-FE BIPHAS 1 MG-10 MCG / 10 MCG PO TABS: 1.0000 | ORAL_TABLET | Freq: Every day | ORAL | 12 refills | Status: DC

## 2020-02-09 MED ORDER — HYDROCODONE-ACETAMINOPHEN 5-325 MG PO TABS: 1.0000 | ORAL_TABLET | Freq: Four times a day (QID) | ORAL | 0 refills | Status: DC | PRN

## 2020-02-09 NOTE — Progress Notes (Signed)
Chief Complaint  Patient presents with  . bad cramping with period    "feels like contractions"; interested is starting birth control pills      Sheila [name] Patient's last menstrual period was 02/04/2020. The current method of family planning is none.  Outpatient Encounter Medications as of 02/09/2020  Medication Sig  . ALPRAZolam (XANAX) 0.5 MG tablet Take 0.5 mg by mouth in the morning and at bedtime.   Marland Kitchen HYDROcodone-acetaminophen (NORCO/VICODIN) 5-325 MG tablet Take 1 tablet by mouth every 6 (six) hours as needed.  Marland Kitchen ketorolac (TORADOL) 10 MG tablet Take 1 tablet (10 mg total) by mouth every 8 (eight) hours as needed.  . Norethindrone-Ethinyl Estradiol-Fe Biphas (LO LOESTRIN FE) 1 MG-10 MCG / 10 MCG tablet Take 1 tablet by mouth daily.  . [DISCONTINUED] Norethindrone-Ethinyl Estradiol-Fe Biphas (LO LOESTRIN FE) 1 MG-10 MCG / 10 MCG tablet Take 1 tablet by mouth daily. (Patient not taking: Reported on 11/03/2019)   No facility-administered encounter medications on file as of 02/09/2020.    Subjective Patient has had increasing problems with painful periods Or menstrual bleeding is not that heavy She has no clotting Really the volume has not changed But the pain certainly has Feels like labor contractions Doubles her over Very disruptive to her life Past Medical History:  Diagnosis Date  . Anxiety   . Hematuria 11/07/2014  . 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 Nosebleed 09/06/2013  . Pain with urination 11/07/2014  . Round ligament pain 09/06/2013    Past Surgical History:  Procedure Laterality Date  . Bilateral breast implants  12/2011    . CESAREAN SECTION N/A 12/21/2013   Procedure: CESAREAN SECTION;  Surgeon: Lazaro Arms, MD;  Location: [facility];  Service: Obstetrics;  Laterality: N/A;  . CESAREAN SECTION N/A 12/16/2017   Procedure: REPEAT CESAREAN SECTION;  Surgeon: Lazaro Arms, MD;  Location: [facility];  Service: Obstetrics;  Laterality:  N/A;  . TONSILLECTOMY      OB History    Gravida  3   Para  2   Term      Preterm  2   AB  1   Living  2     SAB  1   IAB      Ectopic      Multiple  0   Live Births  2           No Known Allergies  Social History   Socioeconomic History  . Marital status: Single    Spouse name: Not on file  . Number of children: Not on file  . Years of education: Not on file  . Highest education level: Not on file  Occupational History  . Not on file  Tobacco Use  . Smoking status: Former Smoker    Types: Cigarettes  . Smokeless tobacco: Never Used  Vaping Use  . Vaping Use: Never used  Substance and Sexual Activity  . Alcohol use: No    Alcohol/week: 2.0 standard drinks    Types: 2 Shots of liquor per week  . Drug use: No  . Sexual activity: Yes    Birth control/protection: None  Other Topics Concern  . Not on file  Social History Narrative  . Not on file   Social Determinants of Health   Financial Resource Strain: Not on file  Food Insecurity: Not on file  Transportation Needs: Not on file  Physical Activity: Not on file  Stress: Not on file  Social Connections: Not on file    Family History  Problem Relation Age of Onset  . Cancer Maternal Grandmother   . Cancer Maternal Grandfather     Medications:       Current Outpatient Medications:  .  ALPRAZolam (XANAX) 0.5 MG tablet, Take 0.5 mg by mouth in the morning and at bedtime. , Disp: , Rfl:  .  HYDROcodone-acetaminophen (NORCO/VICODIN) 5-325 MG tablet, Take 1 tablet by mouth every 6 (six) hours as needed., Disp: 15 tablet, Rfl: 0 .  ketorolac (TORADOL) 10 MG tablet, Take 1 tablet (10 mg total) by mouth every 8 (eight) hours as needed., Disp: 15 tablet, Rfl: 0 .  Norethindrone-Ethinyl Estradiol-Fe Biphas (LO LOESTRIN FE) 1 MG-10 MCG / 10 MCG tablet, Take 1 tablet by mouth daily., Disp: Sheila tablet, Rfl: 12  Objective Blood pressure 122/82, pulse (!) 122, height 5\' 3"  (1.6 m), weight 109 lb (49.4  kg), last menstrual period 02/04/2020.  General [facility] female NAD Vulva:  normal appearing vulva with no masses, tenderness or lesions Vagina:  normal mucosa, no discharge Cervix:  Normal no lesions Uterus:  normal size, contour, position, consistency, mobility, non-tender Adnexa: ovaries:present,  normal adnexa in size, nontender and no masses   Pertinent ROS No burning with urination, frequency or urgency No nausea, vomiting or diarrhea Nor fever chills or other constitutional symptoms   Labs or studies     Impression Diagnoses this Encounter::   ICD-10-CM   1. Dysmenorrhea  N94.6    First episode, really sever, never even taken ibuprofen for periods before  2. Pregnancy examination or test, negative result  Z32.02 POCT urine pregnancy    Established relevant diagnosis(es):   Plan/Recommendations: Meds ordered this encounter  Medications  . Norethindrone-Ethinyl Estradiol-Fe Biphas (LO LOESTRIN FE) 1 MG-10 MCG / 10 MCG tablet    Sig: Take 1 tablet by mouth daily.    Dispense:  Sheila tablet    Refill:  12  . ketorolac (TORADOL) 10 MG tablet    Sig: Take 1 tablet (10 mg total) by mouth every 8 (eight) hours as needed.    Dispense:  15 tablet    Refill:  0  . HYDROcodone-acetaminophen (NORCO/VICODIN) 5-325 MG tablet    Sig: Take 1 tablet by mouth every 6 (six) hours as needed.    Dispense:  15 tablet    Refill:  0    Labs or Scans Ordered: Orders Placed This Encounter  Procedures  . POCT urine pregnancy    Management:: Begin combination oral contraceptive pill therapy low-dose estrogen with nonsteroidal anti-inflammatory drug as well for secondary dysmenorrhea  Follow up Return if symptoms worsen or fail to improve.           All questions were answered.

## 2020-02-12 ENCOUNTER — Ambulatory Visit: Payer: Medicaid Other | Admitting: Adult Health

## 2020-07-31 ENCOUNTER — Telehealth: Payer: Self-pay | Admitting: Adult Health

## 2020-07-31 DIAGNOSIS — R61 Generalized hyperhidrosis: Secondary | ICD-10-CM

## 2020-07-31 NOTE — Telephone Encounter (Signed)
Having night sweats & wants to get her thyroid checked Ok to order labs or need to see primary care?  Please advise & call pt

## 2020-07-31 NOTE — Telephone Encounter (Signed)
Spoke with patient advised that Dr. Despina Hidden  Is ordering a tsh. States that she will come tomorrow.

## 2020-07-31 NOTE — Telephone Encounter (Signed)
Fine to go ahead and order a TSH

## 2020-08-01 ENCOUNTER — Other Ambulatory Visit: Payer: Medicaid Other

## 2020-08-01 DIAGNOSIS — R61 Generalized hyperhidrosis: Secondary | ICD-10-CM | POA: Diagnosis not present

## 2020-08-02 LAB — TSH: TSH: 1.19 u[IU]/mL (ref 0.450–4.500)

## 2020-08-14 ENCOUNTER — Telehealth: Payer: Self-pay | Admitting: Adult Health

## 2020-08-14 NOTE — Telephone Encounter (Signed)
Patient called stating that she had blood work done not to long ago and she would like to know the results. Please contact pt

## 2020-08-14 NOTE — Telephone Encounter (Signed)
Pt aware thyroid test is normal. Pt voiced understanding. JSY

## 2021-02-10 ENCOUNTER — Ambulatory Visit (HOSPITAL_COMMUNITY): Payer: Self-pay

## 2021-05-07 ENCOUNTER — Other Ambulatory Visit (INDEPENDENT_AMBULATORY_CARE_PROVIDER_SITE_OTHER): Payer: Medicaid Other | Admitting: *Deleted

## 2021-05-07 ENCOUNTER — Other Ambulatory Visit (HOSPITAL_COMMUNITY)
Admission: RE | Admit: 2021-05-07 | Discharge: 2021-05-07 | Disposition: A | Discharge status: Home / Self Care | Payer: Medicaid Other | Source: Ambulatory Visit | Attending: Obstetrics & Gynecology | Admitting: Obstetrics & Gynecology

## 2021-05-07 ENCOUNTER — Other Ambulatory Visit: Payer: Self-pay

## 2021-05-07 DIAGNOSIS — N898 Other specified noninflammatory disorders of vagina: Secondary | ICD-10-CM

## 2021-05-07 NOTE — Progress Notes (Signed)
   NURSE VISIT- VAGINITIS/STD/POC  SUBJECTIVE:  Sheila [name] is a 30 y.o. [id number] GYN patientfemale here for a vaginal swab for vaginitis screening, STD screen.  She reports the following symptoms: odor for 3 months. Denies abnormal vaginal bleeding, significant pelvic pain, fever, or UTI symptoms.  OBJECTIVE:  There were no vitals taken for this visit.  Appears well, in no apparent distress  ASSESSMENT: Vaginal swab for STD screen  PLAN: Self-collected vaginal probe for Gonorrhea, Chlamydia, Trichomonas, Bacterial Vaginosis, Yeast sent to lab Treatment: to be determined once results are received Follow-up as needed if symptoms persist/worsen, or new symptoms develop  Annamarie Dawley  05/07/2021 4:09 PM

## 2021-05-07 NOTE — Progress Notes (Signed)
Chart reviewed for nurse visit. Agree with plan of care.  Adline Potter, NP 05/07/2021 4:38 PM

## 2021-05-09 LAB — CERVICOVAGINAL ANCILLARY ONLY
Bacterial Vaginitis (gardnerella): POSITIVE — AB
Candida Glabrata: NEGATIVE
Candida Vaginitis: NEGATIVE
Chlamydia: NEGATIVE
Comment: NEGATIVE
Comment: NEGATIVE
Comment: NEGATIVE
Comment: NEGATIVE
Comment: NEGATIVE
Comment: NORMAL
Neisseria Gonorrhea: NEGATIVE
Trichomonas: NEGATIVE

## 2021-05-12 ENCOUNTER — Other Ambulatory Visit: Payer: Self-pay | Admitting: Adult Health

## 2021-05-12 MED ORDER — METRONIDAZOLE 500 MG PO TABS: 500.0000 mg | ORAL_TABLET | Freq: Two times a day (BID) | ORAL | 0 refills | Status: DC

## 2021-05-12 NOTE — Progress Notes (Signed)
+  BV on vaginal swab, will rx flagyl 

## 2021-05-13 ENCOUNTER — Encounter: Payer: Self-pay | Admitting: *Deleted

## 2021-07-09 ENCOUNTER — Other Ambulatory Visit: Payer: Medicaid Other

## 2021-07-10 ENCOUNTER — Other Ambulatory Visit (HOSPITAL_COMMUNITY)
Admission: RE | Admit: 2021-07-10 | Discharge: 2021-07-10 | Disposition: A | Discharge status: Home / Self Care | Payer: Medicaid Other | Source: Ambulatory Visit | Attending: Advanced Practice Midwife | Admitting: Advanced Practice Midwife

## 2021-07-10 ENCOUNTER — Encounter: Payer: Self-pay | Admitting: Advanced Practice Midwife

## 2021-07-10 ENCOUNTER — Other Ambulatory Visit: Payer: Self-pay

## 2021-07-10 ENCOUNTER — Ambulatory Visit: Payer: Medicaid Other | Admitting: Advanced Practice Midwife

## 2021-07-10 VITALS — BP 99/65 | HR 91 | Ht 63.0 in | Wt 114.0 lb

## 2021-07-10 DIAGNOSIS — N898 Other specified noninflammatory disorders of vagina: Secondary | ICD-10-CM | POA: Diagnosis not present

## 2021-07-10 NOTE — Progress Notes (Signed)
[facility] Visit  Patient name: Sheila [name] MRN [id number]  Date of birth: 1990/06/05  CC & HPI:  Sheila [name] is a 31 y.o. Caucasian female presenting today for possible vaginal odor.  Treated for BV 2 months ago, sx (mainly odor) resolved.  Now feels like she detects same odor, but could "also be being paranoid".  No change in discharge, vaginal itch or irritation  Pertinent History Reviewed:  Medical & Surgical Hx:   Past Medical History:  Diagnosis Date   Anxiety    Hematuria 11/07/2014   Herpes infection 11/07/2014   HSV-2 infection    Nosebleed 09/06/2013   Pain with urination 11/07/2014   Round ligament pain 09/06/2013   Past Surgical History:  Procedure Laterality Date   Bilateral breast implants  12/2011     CESAREAN SECTION N/A 12/21/2013   Procedure: CESAREAN SECTION;  Surgeon: Florian Buff, MD;  Location: [facility];  Service: Obstetrics;  Laterality: N/A;   CESAREAN SECTION N/A 12/16/2017   Procedure: REPEAT CESAREAN SECTION;  Surgeon: Florian Buff, MD;  Location: [facility];  Service: Obstetrics;  Laterality: N/A;   TONSILLECTOMY     Family History  Problem Relation Age of Onset   Cancer Maternal Grandmother    Cancer Maternal Grandfather     Current Outpatient Medications:    ALPRAZolam (XANAX) 0.5 MG tablet, Take 0.5 mg by mouth as needed., Disp: , Rfl:  Social History: Reviewed -  reports that she has quit smoking. Her smoking use included cigarettes. She has never used smokeless tobacco.  Review of Systems:   Constitutional: Negative for fever and chills Eyes: Negative for visual disturbances Respiratory: Negative for shortness of breath, dyspnea Cardiovascular: Negative for chest pain or palpitations  Gastrointestinal: Negative for vomiting, diarrhea and constipation; no abdominal pain Genitourinary: Negative for dysuria and urgency, vaginal irritation or itching Musculoskeletal: Negative for back pain, joint pain, myalgias  Neurological:  Negative for dizziness and headaches    Objective Findings:    Physical Examination: Vitals:   07/10/21 0915  BP: 99/65  Pulse: 91   General appearance - well appearing, and in no distress Mental status - alert, oriented to person, place, and time Chest:  Normal respiratory effort Heart - normal rate and regular rhythm Abdomen:  Soft, nontender Pelvic: normal appearing vagina. Discharge appears normal. No detectable odor.  Musculoskeletal:  Normal range of motion without pain Extremities:  No edema    No results found for this or any previous visit (from the past 24 hour(s)).    Assessment & Plan:  A:   Vaginal odor per pt, not observed P:  BV swab   No follow-ups on file.  Christin Fudge CNM 07/10/2021 9:36 AM

## 2021-07-11 LAB — CERVICOVAGINAL ANCILLARY ONLY
Bacterial Vaginitis (gardnerella): POSITIVE — AB
Candida Glabrata: NEGATIVE
Candida Vaginitis: NEGATIVE
Chlamydia: NEGATIVE
Comment: NEGATIVE
Comment: NEGATIVE
Comment: NEGATIVE
Comment: NEGATIVE
Comment: NEGATIVE
Comment: NORMAL
Neisseria Gonorrhea: NEGATIVE
Trichomonas: NEGATIVE

## 2021-07-14 ENCOUNTER — Other Ambulatory Visit: Payer: Self-pay | Admitting: Advanced Practice Midwife

## 2021-07-14 MED ORDER — METRONIDAZOLE 0.75 % VA GEL: 1.0000 | Freq: Every day | VAGINAL | 0 refills | Status: DC

## 2021-07-14 NOTE — Progress Notes (Signed)
Metrogel for 5 days

## 2022-09-28 ENCOUNTER — Inpatient Hospital Stay (HOSPITAL_COMMUNITY): Payer: Medicaid Other

## 2022-09-28 ENCOUNTER — Encounter (HOSPITAL_COMMUNITY): Payer: Self-pay | Admitting: Family Medicine

## 2022-09-28 ENCOUNTER — Other Ambulatory Visit: Payer: Medicaid Other

## 2022-09-28 ENCOUNTER — Inpatient Hospital Stay (HOSPITAL_COMMUNITY)
Admission: AD | Admit: 2022-09-28 | Discharge: 2022-09-29 | Disposition: A | Discharge status: Home / Self Care | Payer: Medicaid Other | Attending: Family Medicine | Admitting: Family Medicine

## 2022-09-28 DIAGNOSIS — R1030 Lower abdominal pain, unspecified: Secondary | ICD-10-CM | POA: Insufficient documentation

## 2022-09-28 DIAGNOSIS — Z3A01 Less than 8 weeks gestation of pregnancy: Secondary | ICD-10-CM | POA: Insufficient documentation

## 2022-09-28 DIAGNOSIS — Q5128 Other doubling of uterus, other specified: Secondary | ICD-10-CM | POA: Diagnosis not present

## 2022-09-28 DIAGNOSIS — O3401 Maternal care for unspecified congenital malformation of uterus, first trimester: Secondary | ICD-10-CM | POA: Insufficient documentation

## 2022-09-28 DIAGNOSIS — O26891 Other specified pregnancy related conditions, first trimester: Secondary | ICD-10-CM | POA: Insufficient documentation

## 2022-09-28 DIAGNOSIS — Z3491 Encounter for supervision of normal pregnancy, unspecified, first trimester: Secondary | ICD-10-CM

## 2022-09-28 LAB — CBC
HCT: 36.1 % (ref 36.0–46.0)
Hemoglobin: 11.4 g/dL — ABNORMAL LOW (ref 12.0–15.0)
MCH: 26.9 pg (ref 26.0–34.0)
MCHC: 31.6 g/dL (ref 30.0–36.0)
MCV: 85.1 fL (ref 80.0–100.0)
Platelets: 252 10*3/uL (ref 150–400)
RBC: 4.24 MIL/uL (ref 3.87–5.11)
RDW: 11.9 % (ref 11.5–15.5)
WBC: 9.8 10*3/uL (ref 4.0–10.5)
nRBC: 0 % (ref 0.0–0.2)

## 2022-09-28 LAB — WET PREP, GENITAL
Clue Cells Wet Prep HPF POC: NONE SEEN
Sperm: NONE SEEN
Trich, Wet Prep: NONE SEEN
WBC, Wet Prep HPF POC: <10 (ref <10)
Yeast Wet Prep HPF POC: NONE SEEN

## 2022-09-28 LAB — POCT PREGNANCY, URINE: Preg Test, Ur: POSITIVE — AB

## 2022-09-28 LAB — HIV ANTIBODY (ROUTINE TESTING W REFLEX): HIV Screen 4th Generation wRfx: NONREACTIVE

## 2022-09-28 LAB — HCG, QUANTITATIVE, PREGNANCY: hCG, Beta Chain, Quant, S: 97828 m[IU]/mL — ABNORMAL HIGH (ref <5)

## 2022-09-28 NOTE — Discharge Instructions (Signed)

## 2022-09-28 NOTE — MAU Provider Note (Signed)
History     CSN: [id number]  Arrival date and time: 09/28/22 1925   Event Date/Time   First Provider Initiated Contact with Patient 09/28/22 2335      Chief Complaint  Patient presents with   Abdominal Pain   HPI  Sheila [name] is a 32 y.o. [id number] at [redacted]w[redacted]d who presents for evaluation of lower abdominal cramping. Patient reports she has had cramping in her lower abdomen since Saturday. Patient rates the pain as a 3/10 and has not tried anything for the pain. She denies any vaginal bleeding, discharge, and leaking of fluid. Denies any constipation, diarrhea or any urinary complaints.   [facility] and told she was having a miscarriage.   OB History     Gravida  4   Para  2   Term      Preterm  2   AB  1   Living  2      SAB  1   IAB      Ectopic      Multiple  0   Live Births  2           Past Medical History:  Diagnosis Date   Anxiety    Hematuria 11/07/2014   Herpes infection 11/07/2014   HSV-2 infection    Nosebleed 09/06/2013   Pain with urination 11/07/2014   Round ligament pain 09/06/2013    Past Surgical History:  Procedure Laterality Date   Bilateral breast implants  12/2011     CESAREAN SECTION N/A 12/21/2013   Procedure: CESAREAN SECTION;  Surgeon: Lazaro Arms, MD;  Location: [facility];  Service: Obstetrics;  Laterality: N/A;   CESAREAN SECTION N/A 12/16/2017   Procedure: REPEAT CESAREAN SECTION;  Surgeon: Lazaro Arms, MD;  Location: [facility];  Service: Obstetrics;  Laterality: N/A;   TONSILLECTOMY      Family History  Problem Relation Age of Onset   Cancer Maternal Grandmother    Cancer Maternal Grandfather     Social History   Tobacco Use   Smoking status: Former    Types: Cigarettes   Smokeless tobacco: Never  Vaping Use   Vaping Use: Never used  Substance Use Topics   Alcohol use: No    Alcohol/week: 2.0 standard drinks of alcohol    Types: 2 Shots of liquor per week    Drug use: No    Allergies: No Known Allergies  No medications prior to admission.    Review of Systems  Constitutional: Negative.  Negative for fatigue and fever.  HENT: Negative.    Respiratory: Negative.  Negative for shortness of breath.   Cardiovascular: Negative.  Negative for chest pain.  Gastrointestinal:  Positive for abdominal pain. Negative for constipation, diarrhea, nausea and vomiting.  Genitourinary: Negative.  Negative for dysuria, vaginal bleeding and vaginal discharge.  Neurological: Negative.  Negative for dizziness and headaches.   Physical Exam   Blood pressure (!) 102/59, pulse 87, temperature 98 F (36.7 C), temperature source Oral, resp. rate 12, height 5\' 2"  (1.575 m), weight 52.5 kg, last menstrual period 08/08/2022.  Patient Vitals for the past 24 hrs:  BP Temp Temp src Pulse Resp Height Weight  09/29/22 0012 -- -- -- -- 12 -- --  09/28/22 2005 (!) 102/59 98 F (36.7 C) Oral 87 12 5\' 2"  (1.575 m) 52.5 kg    Physical Exam Vitals and nursing note reviewed.  Constitutional:  General: She is not in acute distress.    Appearance: She is well-developed.  HENT:     Head: Normocephalic.  Eyes:     Pupils: Pupils are equal, round, and reactive to light.  Cardiovascular:     Rate and Rhythm: Normal rate and regular rhythm.     Heart sounds: Normal heart sounds.  Pulmonary:     Effort: Pulmonary effort is normal. No respiratory distress.     Breath sounds: Normal breath sounds.  Abdominal:     General: Bowel sounds are normal. There is no distension.     Palpations: Abdomen is soft.     Tenderness: There is no abdominal tenderness.  Skin:    General: Skin is warm and dry.  Neurological:     Mental Status: She is alert and oriented to person, place, and time.  Psychiatric:        Mood and Affect: Mood normal.        Behavior: Behavior normal.        Thought Content: Thought content normal.        Judgment: Judgment normal.     MAU  Course  Procedures  Results for orders placed or performed during the hospital encounter of 09/28/22 (from the past 24 hour(s))  Pregnancy, urine POC     Status: Abnormal   Collection Time: 09/28/22  8:22 PM  Result Value Ref Range   Preg Test, Ur POSITIVE (A) NEGATIVE  Wet prep, genital     Status: None   Collection Time: 09/28/22  8:38 PM  Result Value Ref Range   Yeast Wet Prep HPF POC NONE SEEN NONE SEEN   Trich, Wet Prep NONE SEEN NONE SEEN   Clue Cells Wet Prep HPF POC NONE SEEN NONE SEEN   WBC, Wet Prep HPF POC <10 <10   Sperm NONE SEEN   HIV Antibody (routine testing w rflx)     Status: None   Collection Time: 09/28/22  8:57 PM  Result Value Ref Range   HIV Screen 4th Generation wRfx Non Reactive Non Reactive  CBC     Status: Abnormal   Collection Time: 09/28/22  8:57 PM  Result Value Ref Range   WBC 9.8 4.0 - 10.5 K/uL   RBC 4.24 3.87 - 5.11 MIL/uL   Hemoglobin 11.4 (L) 12.0 - 15.0 g/dL   HCT 16.1 09.6 - 04.5 %   MCV 85.1 80.0 - 100.0 fL   MCH 26.9 26.0 - 34.0 pg   MCHC 31.6 30.0 - 36.0 g/dL   RDW 40.9 81.1 - 91.4 %   Platelets 252 150 - 400 K/uL   nRBC 0.0 0.0 - 0.2 %  hCG, quantitative, pregnancy     Status: Abnormal   Collection Time: 09/28/22  8:57 PM  Result Value Ref Range   hCG, Beta Chain, Quant, S 97,828 (H) <5 mIU/mL     US OB Comp Less 14 Wks  Result Date: 09/28/2022 CLINICAL DATA:  Abdominal pain.  Beta HCG 97,000.  LMP 08/08/2022 EXAM: OBSTETRIC <14 WK ULTRASOUND TECHNIQUE: Transabdominal ultrasound was performed for evaluation of the gestation as well as the maternal uterus and adnexal regions. COMPARISON:  None Available. FINDINGS: Intrauterine gestational sac: Single Yolk sac:  Visualized. Embryo:  Visualized. Cardiac Activity: Visualized. Heart Rate: 128 bpm CRL:   7 mm   6 w 4 d                  [place] EDC: 05/20/2023 Subchorionic hemorrhage:  None  visualized. Maternal uterus/adnexae: Double uterine cavity is visualized but the gestational sac and the  left endometrial cavity. IMPRESSION: Uterine didelphys. Single living intrauterine gestation within the left endometrial cavity. Electronically Signed   By: Minerva Fester M.D.   On: 09/28/2022 23:18     MDM Labs ordered and reviewed.   UA, UPT CBC, HCG ABO/Rh- A Pos Wet prep and gc/chlamydia US OB Comp Less 14 weeks with Transvaginal   CNM independently reviewed the imaging ordered. Imaging show live IUP consistent with dates.   Assessment and Plan   1. Normal intrauterine pregnancy on prenatal ultrasound in first trimester   2. [redacted] weeks gestation of pregnancy   3. Uterus didelphys     -Discharge home in stable condition -First trimester precautions discussed -Patient advised to follow-up with OB to establish care -Patient may return to MAU as needed or if her condition were to change or worsen  Rolm Bookbinder, CNM 09/29/2022, 11:35 PM

## 2022-09-28 NOTE — MAU Note (Addendum)
Pt says she has cramping - started Sat - and now worse 3/10 Last sex- Thursday No vag D/C  Had UPT done at [facility]- on Fri- had U/S there - saw a sac but nothing else

## 2022-09-29 LAB — GC/CHLAMYDIA PROBE AMP (~~LOC~~) NOT AT ARMC
Chlamydia: NEGATIVE
Comment: NEGATIVE
Comment: NORMAL
Neisseria Gonorrhea: NEGATIVE

## 2022-10-01 ENCOUNTER — Telehealth: Payer: Self-pay

## 2022-10-01 NOTE — Telephone Encounter (Signed)
Patient hasn't read mychart message left on 4/30. I called patient and left voicemail message that the dating ultrasound was no longer needed since she had one at the hospital, but to call office to get her new ob appointments scheduled. mc

## 2022-10-07 ENCOUNTER — Other Ambulatory Visit: Payer: Medicaid Other

## 2022-10-21 ENCOUNTER — Encounter: Payer: Self-pay | Admitting: Obstetrics & Gynecology

## 2022-10-21 ENCOUNTER — Ambulatory Visit: Payer: Medicaid Other | Admitting: Obstetrics & Gynecology

## 2022-10-21 VITALS — BP 108/62 | HR 93 | Ht 62.0 in | Wt 113.2 lb

## 2022-10-21 DIAGNOSIS — Z30011 Encounter for initial prescription of contraceptive pills: Secondary | ICD-10-CM | POA: Diagnosis not present

## 2022-10-21 DIAGNOSIS — Z3201 Encounter for pregnancy test, result positive: Secondary | ICD-10-CM

## 2022-10-21 LAB — POCT URINE PREGNANCY: Preg Test, Ur: POSITIVE — AB

## 2022-10-21 MED ORDER — NORETHIN-ETH ESTRAD-FE BIPHAS 1 MG-10 MCG / 10 MCG PO TABS
1.0000 | ORAL_TABLET | Freq: Every day | ORAL | 4 refills | Status: AC
Start: 2022-10-21 — End: 2024-01-27

## 2022-10-21 NOTE — Progress Notes (Signed)
   GYN VISIT Patient name: Sheila [name] MRN [id number]  Date of birth: 1990/10/10 Chief Complaint:   Follow-up (Discuss birth control)  History of Present Illness:   Sheila [name] is a 32 y.o. [phone] female being seen today for contraceptive management.  Patient recently underwent termination completed on 10-08-2022.  She has follow-up with their office later this week.  Based on clinical history it does sound as though termination was completed.  She reports no vaginal bleeding currently.  Denies fevers or chills.  Denies pelvic or abdominal pain.  She presents today to discuss contraceptive management.  She was previously on the pills and wishes to return to this     Patient's last menstrual period was 08/08/2022.    Review of Systems:   Pertinent items are noted in HPI Denies fever/chills, dizziness, headaches, visual disturbances, fatigue, shortness of breath, chest pain, abdominal pain, vomiting, no problems with periods, bowel movements, urination, or intercourse unless otherwise stated above.  Pertinent History Reviewed:   Past Surgical History:  Procedure Laterality Date   Bilateral breast implants  12/2011     CESAREAN SECTION N/A 12/21/2013   Procedure: CESAREAN SECTION;  Surgeon: Lazaro Arms, MD;  Location: [facility];  Service: Obstetrics;  Laterality: N/A;   CESAREAN SECTION N/A 12/16/2017   Procedure: REPEAT CESAREAN SECTION;  Surgeon: Lazaro Arms, MD;  Location: [facility] BIRTHING SUIT[facility];  Service: Obstetrics;  Laterality: N/A;   TONSILLECTOMY      Past Medical History:  Diagnosis Date   Anxiety    Hematuria 11/07/2014   Herpes infection 11/07/2014   HSV-2 infection    Nosebleed 09/06/2013   Pain with urination 11/07/2014   Round ligament pain 09/06/2013   Reviewed problem list, medications and allergies. Physical Assessment:   Vitals:   10/21/22 1345  BP: 108/62  Pulse: 93  Weight: 113 lb 3.2 oz (51.3 kg)  Height: 5\' 2"  (1.575 m)  Body mass index is 20.7 kg/m.        Physical Examination:   General appearance: alert, well appearing, and in no distress  Psych: mood appropriate, normal affect  Skin: warm & dry   Cardiovascular: normal heart rate noted  Respiratory: normal respiratory effort, no distress  Abdomen: soft, non-tender, no rebound, no guarding  Extremities: no edema   Chaperone: N/A    Assessment & Plan:  1) contraceptive management -reviewed all contraceptive options including pills, patch, ring, Depot or LARCs -risk/benefit and potential side effects of each were reviewed -questions and concerns were addressed, pt desires to proceed with OCPs  -Follow-up in 3 months for medication check and annual exam  2) s/p termination Patient to follow-up with outside clinic as scheduled  []  Patient due for Pap but did not want to complete today  Meds ordered this encounter  Medications   Norethindrone-Ethinyl Estradiol-Fe Biphas (LO LOESTRIN FE) 1 MG-10 MCG / 10 MCG tablet    Sig: Take 1 tablet by mouth daily.    Dispense:  90 tablet    Refill:  4     Orders Placed This Encounter  Procedures   POCT urine pregnancy    Return in about 3 months (around 01/21/2023) for annual/medication follow up.   Myna Hidalgo, DO Attending Obstetrician & Gynecologist, [facility] for [employer], [facility]

## 2023-01-26 ENCOUNTER — Ambulatory Visit: Payer: Medicaid Other | Admitting: Obstetrics & Gynecology

## 2023-01-27 ENCOUNTER — Ambulatory Visit (INDEPENDENT_AMBULATORY_CARE_PROVIDER_SITE_OTHER): Payer: Medicaid Other | Admitting: Obstetrics & Gynecology

## 2023-01-27 ENCOUNTER — Encounter: Payer: Self-pay | Admitting: Obstetrics & Gynecology

## 2023-01-27 ENCOUNTER — Other Ambulatory Visit (HOSPITAL_COMMUNITY)
Admission: RE | Admit: 2023-01-27 | Discharge: 2023-01-27 | Disposition: A | Discharge status: Home / Self Care | Payer: Medicaid Other | Source: Ambulatory Visit | Attending: Obstetrics & Gynecology | Admitting: Obstetrics & Gynecology

## 2023-01-27 VITALS — BP 103/65 | HR 79 | Ht 62.0 in | Wt 119.8 lb

## 2023-01-27 DIAGNOSIS — Z113 Encounter for screening for infections with a predominantly sexual mode of transmission: Secondary | ICD-10-CM | POA: Insufficient documentation

## 2023-01-27 DIAGNOSIS — Z01419 Encounter for gynecological examination (general) (routine) without abnormal findings: Secondary | ICD-10-CM

## 2023-01-27 NOTE — Progress Notes (Signed)
   WELL-WOMAN EXAMINATION Patient name: Sheila [name] MRN [id number]  Date of birth: 11/20/1990 Chief Complaint:   Gynecologic Exam  History of Present Illness:   Sheila [name] is a 32 y.o. 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   Doing okay with OCPs.  She notes some irregularity of when she does have her period but the bleeding is light.  She denies vaginal discharge, itching or irritation.  Denies pelvic or abdominal pain.  Declined STI screening.  No acute GYN concerns  Patient's last menstrual period was 01/18/2023. Denies issues with her menses The current method of family planning is OCP (estrogen/progesterone).    Last pap 2019.  Last mammogram: na. Last colonoscopy: na     04/12/2019    9:40 AM 06/28/2017    1:59 PM  Depression screen PHQ 2/9  Decreased Interest 0 0  Down, Depressed, Hopeless 0 0  PHQ - 2 Score 0 0  Altered sleeping  1  Tired, decreased energy  1  Change in appetite  0  Feeling bad or failure about yourself   0  Trouble concentrating  0  Moving slowly or fidgety/restless  0  Suicidal thoughts  0  PHQ-9 Score  2  Difficult doing work/chores  Not difficult at all      Review of Systems:   Pertinent items are noted in HPI Denies any headaches, blurred vision, fatigue, shortness of breath, chest pain, abdominal pain, bowel movements, urination, or intercourse unless otherwise stated above.  Pertinent History Reviewed:  Reviewed past medical,surgical, social and family history.  Reviewed problem list, medications and allergies. Physical Assessment:   Vitals:   01/27/23 1057  BP: 103/65  Pulse: 79  Weight: 119 lb 12.8 oz (54.3 kg)  Height: 5\' 2"  (1.575 m)  Body mass index is 21.91 kg/m.        Physical Examination:   General appearance - well appearing, and in no distress  Mental status - alert, oriented to person, place, and time  Psych:  She has a normal mood and affect  Skin - warm  and dry, normal color, no suspicious lesions noted  Chest - effort normal, all lung fields clear to auscultation bilaterally  Heart - normal rate and regular rhythm  Neck:  midline trachea, no thyromegaly or nodules  Breasts - breasts appear normal, no suspicious masses, no skin or nipple changes or  axillary nodes  Abdomen - soft, nontender, nondistended, no masses or organomegaly  Pelvic - VULVA: normal appearing vulva with no masses, tenderness or lesions  VAGINA: normal appearing vagina with normal color and discharge, no lesions  CERVIX: normal appearing cervix without discharge or lesions, no CMT  Thin prep pap is done with HR HPV cotesting  UTERUS: uterus is felt to be normal size, shape, consistency and nontender   ADNEXA: No adnexal masses or tenderness noted.  Extremities:  No swelling or varicosities noted  Chaperone: Faith Rogue     Assessment & Plan:  1) Well-Woman Exam -Pap collected, reviewed ASCCP guidelines  2) contraception -Patient doing well with OCPs and plan to continue  No orders of the defined types were placed in this encounter.   Meds: No orders of the defined types were placed in this encounter.   Follow-up: Return in about 1 year (around 01/27/2024) for Annual.   Myna Hidalgo, DO Attending Obstetrician & Gynecologist, [facility], [facility]

## 2023-02-04 LAB — CYTOLOGY - PAP
Adequacy: ABSENT
Chlamydia: NEGATIVE
Comment: NEGATIVE
Comment: NEGATIVE
Comment: NEGATIVE
Comment: NEGATIVE
Comment: NEGATIVE
Comment: NORMAL
Diagnosis: NEGATIVE
HPV 16: NEGATIVE
HPV 18 / 45: NEGATIVE
High risk HPV: POSITIVE — AB
Neisseria Gonorrhea: NEGATIVE
Trichomonas: NEGATIVE

## 2023-11-17 ENCOUNTER — Ambulatory Visit: Admitting: *Deleted

## 2023-11-17 VITALS — BP 113/75 | HR 84

## 2023-11-17 DIAGNOSIS — Z3201 Encounter for pregnancy test, result positive: Secondary | ICD-10-CM | POA: Diagnosis not present

## 2023-11-17 DIAGNOSIS — N926 Irregular menstruation, unspecified: Secondary | ICD-10-CM

## 2023-11-17 LAB — POCT URINE PREGNANCY: Preg Test, Ur: POSITIVE — AB

## 2023-11-17 NOTE — Progress Notes (Signed)
   NURSE VISIT- PREGNANCY CONFIRMATION   SUBJECTIVE:  Sheila [name] is a 33 y.o. [id number] female at [redacted]w[redacted]d by certain LMP of Patient's last menstrual period was 10/10/2023. Here for pregnancy confirmation.  Home pregnancy test: positive x 1  She reports no complaints.  She is not taking prenatal vitamins.    OBJECTIVE:  BP 113/75 (BP Location: Right Arm, Patient Position: Sitting, Cuff Size: Normal)   Pulse 84   LMP 10/10/2023   Appears well, in no apparent distress  Results for orders placed or performed in visit on 11/17/23 (from the past 24 hours)  POCT urine pregnancy   Collection Time: 11/17/23 12:10 PM  Result Value Ref Range   Preg Test, Ur Positive (A) Negative    ASSESSMENT: Positive pregnancy test, [redacted]w[redacted]d by LMP    PLAN: Schedule for dating ultrasound in 2 weeks Prenatal vitamins: plans to begin OTC ASAP   Nausea medicines: not currently needed   OB packet given: Yes  Kerrie Peek  11/17/2023 12:13 PM

## 2023-11-29 ENCOUNTER — Other Ambulatory Visit: Payer: Self-pay

## 2023-11-29 ENCOUNTER — Ambulatory Visit

## 2023-11-29 DIAGNOSIS — O3680X0 Pregnancy with inconclusive fetal viability, not applicable or unspecified: Secondary | ICD-10-CM

## 2023-11-29 DIAGNOSIS — Z3A01 Less than 8 weeks gestation of pregnancy: Secondary | ICD-10-CM

## 2023-11-29 DIAGNOSIS — Z3687 Encounter for antenatal screening for uncertain dates: Secondary | ICD-10-CM | POA: Diagnosis not present

## 2023-11-29 DIAGNOSIS — O3680X1 Pregnancy with inconclusive fetal viability, fetus 1: Secondary | ICD-10-CM
